# Patient Record
Sex: Female | Born: 1970 | Race: White | Hispanic: No | Marital: Married | Smoking: Never smoker
Health system: Southern US, Community
[De-identification: ages and names within clinical notes are randomized; demographics above are authoritative.]

## PROBLEM LIST (undated history)

## (undated) DIAGNOSIS — R12 Heartburn: Secondary | ICD-10-CM

## (undated) DIAGNOSIS — R519 Headache, unspecified: Secondary | ICD-10-CM

## (undated) DIAGNOSIS — M7989 Other specified soft tissue disorders: Secondary | ICD-10-CM

## (undated) DIAGNOSIS — R5383 Other fatigue: Secondary | ICD-10-CM

## (undated) DIAGNOSIS — M791 Myalgia, unspecified site: Secondary | ICD-10-CM

## (undated) DIAGNOSIS — M2550 Pain in unspecified joint: Secondary | ICD-10-CM

## (undated) DIAGNOSIS — E663 Overweight: Secondary | ICD-10-CM

## (undated) DIAGNOSIS — R0602 Shortness of breath: Secondary | ICD-10-CM

## (undated) HISTORY — DX: Overweight: E66.3

## (undated) HISTORY — DX: Shortness of breath: R06.02

## (undated) HISTORY — DX: Pain in unspecified joint: M25.50

## (undated) HISTORY — DX: Other fatigue: R53.83

## (undated) HISTORY — DX: Myalgia, unspecified site: M79.10

## (undated) HISTORY — DX: Headache, unspecified: R51.9

## (undated) HISTORY — DX: Heartburn: R12

## (undated) HISTORY — DX: Other specified soft tissue disorders: M79.89

---

## 1997-08-21 ENCOUNTER — Ambulatory Visit (HOSPITAL_BASED_OUTPATIENT_CLINIC_OR_DEPARTMENT_OTHER): Admission: RE | Admit: 1997-08-21 | Discharge: 1997-08-21 | Payer: Self-pay | Admitting: Orthopaedic Surgery

## 1998-03-20 ENCOUNTER — Other Ambulatory Visit: Admission: RE | Admit: 1998-03-20 | Discharge: 1998-03-20 | Payer: Self-pay | Admitting: *Deleted

## 1998-08-19 HISTORY — PX: ANTERIOR CRUCIATE LIGAMENT REPAIR: SHX115

## 2000-01-15 ENCOUNTER — Other Ambulatory Visit: Admission: RE | Admit: 2000-01-15 | Discharge: 2000-01-15 | Payer: Self-pay | Admitting: *Deleted

## 2000-06-08 ENCOUNTER — Encounter: Payer: Self-pay | Admitting: Internal Medicine

## 2000-06-08 ENCOUNTER — Encounter: Admission: RE | Admit: 2000-06-08 | Discharge: 2000-06-08 | Payer: Self-pay | Admitting: Internal Medicine

## 2001-01-11 ENCOUNTER — Other Ambulatory Visit: Admission: RE | Admit: 2001-01-11 | Discharge: 2001-01-11 | Payer: Self-pay | Admitting: Obstetrics and Gynecology

## 2001-08-17 ENCOUNTER — Encounter (INDEPENDENT_AMBULATORY_CARE_PROVIDER_SITE_OTHER): Payer: Self-pay | Admitting: *Deleted

## 2001-08-17 ENCOUNTER — Inpatient Hospital Stay (HOSPITAL_COMMUNITY): Admission: AD | Admit: 2001-08-17 | Discharge: 2001-08-21 | Payer: Self-pay | Admitting: Obstetrics and Gynecology

## 2001-10-04 ENCOUNTER — Other Ambulatory Visit: Admission: RE | Admit: 2001-10-04 | Discharge: 2001-10-04 | Payer: Self-pay | Admitting: Obstetrics and Gynecology

## 2003-09-19 ENCOUNTER — Encounter: Admission: RE | Admit: 2003-09-19 | Discharge: 2003-09-19 | Payer: Self-pay | Admitting: Specialist

## 2006-01-29 ENCOUNTER — Encounter: Admission: RE | Admit: 2006-01-29 | Discharge: 2006-01-29 | Payer: Self-pay | Admitting: Obstetrics and Gynecology

## 2006-04-01 ENCOUNTER — Inpatient Hospital Stay (HOSPITAL_COMMUNITY): Admission: AD | Admit: 2006-04-01 | Discharge: 2006-04-04 | Payer: Self-pay | Admitting: Obstetrics and Gynecology

## 2006-06-10 ENCOUNTER — Ambulatory Visit: Payer: Self-pay | Admitting: Family Medicine

## 2007-07-20 LAB — HM PAP SMEAR

## 2007-07-20 LAB — RESULTS CONSOLE HPV: CHL HPV: NEGATIVE

## 2008-07-08 ENCOUNTER — Emergency Department (HOSPITAL_COMMUNITY): Admission: EM | Admit: 2008-07-08 | Discharge: 2008-07-08 | Payer: Self-pay | Admitting: Family Medicine

## 2008-07-09 ENCOUNTER — Ambulatory Visit: Payer: Self-pay | Admitting: Family Medicine

## 2008-10-01 ENCOUNTER — Ambulatory Visit: Payer: Self-pay | Admitting: Family Medicine

## 2009-02-19 ENCOUNTER — Ambulatory Visit: Payer: Self-pay | Admitting: Family Medicine

## 2009-02-21 ENCOUNTER — Encounter: Admission: RE | Admit: 2009-02-21 | Discharge: 2009-02-21 | Payer: Self-pay | Admitting: Family Medicine

## 2009-03-05 ENCOUNTER — Ambulatory Visit (HOSPITAL_BASED_OUTPATIENT_CLINIC_OR_DEPARTMENT_OTHER): Admission: RE | Admit: 2009-03-05 | Discharge: 2009-03-05 | Payer: Self-pay | Admitting: Orthopaedic Surgery

## 2010-07-23 LAB — POCT HEMOGLOBIN-HEMACUE: Hemoglobin: 13.7 g/dL (ref 12.0–15.0)

## 2010-09-05 NOTE — Op Note (Signed)
[facility]  Patient:    VALEN, MASCARO Visit Number: [id number] MRN: [id number]          Service Type: OBS Location: 910A 9146 01 Attending Physician:  Maxie Better Dictated by:   Sheria Lang. Cherly Hensen, M.D. Proc. Date: 08/18/01 Admit Date:  08/17/2001 Discharge Date: 08/21/2001                             Operative Report  PREOPERATIVE DIAGNOSIS:       Nonreassuring fetal status, post dates.  POSTOPERATIVE DIAGNOSIS:      Nonreassuring fetal status, post dates.  OPERATION:                    Primary cesarean section per hysterotomy.  SURGEON:                      Sheronette A. Cherly Hensen, M.D.  ANESTHESIA:                   Spinal.  INDICATIONS:                  This is a 40 year old gravida 2, para 0-0-1-0, married white female at 40-5/7th weeks gestation who was admitted on August 17, 2001, for induction of labor secondary to post dates.  The patient received Cervidil placement on August 17, 2001.  She was subsequently started on Pitocin on Aug 18, 2001, after the Cervidil had been removed.  The patient received up to 20 mU of Pitocin and subsequently had spontaneous rupture of membranes with thick meconium noted.  At that time, her cervix was fingertip, about 1 cm long, -2 vertex presentation.  She had a run of repetitive late decelerations on external monitor which initially responded to positional changes of the mother.  However, it subsequently returned without response to positional changes, etc., and due to the inability to access the fetus with the internal scalp electrode, the decision was made to proceed with primary cesarean section.  Risks and benefits of the procedure were explained to the patient and her husband.  Consent was signed and the patient was transferred to the operating room.  DESCRIPTION OF PROCEDURE:     Under adequate spinal anesthesia, the patient was placed in the supine position with a left lateral  tilt.  She was sterilely prepped and draped in the usual fashion.  An indwelling Foley catheter had been placed after the spinal anesthesia.  A marking pen was utilized to outline the planned Pfannenstiel skin incision.  Quarter percent Marcaine was injected in the longus line.  The scalpel was then used to make a Pfannenstiel skin incision and carried down to the rectus fascia using Bovie cautery.  The rectus fascia was incised in the extended bilaterally.  The rectus fascia was then bluntly and with cautery dissected off the rectus muscle in a superior and inferior fashion.  The rectus muscle was split in the midline.  The parietal peritoneum was entered bluntly and extended superiorly and inferiorly.  The vesicouterine peritoneum was then opened and the bladder dissected off the lower uterine segment in a blunt fashion.  The bladder retractor was then used to displace the bladder inferiorly.  A curvilinear low transverse uterine incision was then made and extended bilaterally using bandaged scissors.  The baby was noted to be in the left occiput transverse to left occiput posterior position.  Thick meconium was noted.  The baby was delivered from its position, DeLeed and bulb suctioned on the abdomen.  The cord was clamped and cut.  The baby was transferred to the waiting pediatrician who assigned Apgars of 9 and 9 at one and five minutes.  Cord pH was obtained which was subsequently noted to be 7.28.  The placenta was anterior spontaneous.  The uterine cavity was then cleaned of debris.  The uterine incision was examined.  No extension noted.  The uterine incision was then closed in two layers, the first layer was a 0 Monocryl running locked stitch.  The second layer was imbricating using 0 Monocryl suture.  The vesicouterine peritoneum showed small bleeders which was cauterized.  The pericolic gutters were then cleaned of debris, irrigated, and suctioned. Normal tubes and ovaries were  noted bilaterally.  The vesicouterine peritoneum and the parietal peritoneum were then closed.  The under surface of the rectus fascia was inspected and small bleeders cauterized.  The rectus fascia was closed with 0 Vicryl x2.  The subcutaneous area was irrigated and small bleeders cauterized.  The skin was approximated with Ethicon staples.  The specimen was placenta sent to pathology.  Estimated blood loss was 800 cc.  Intraoperative fluid was 1600 cc of crystalloid.  Urine output was 575 cc of clear yellow urine.  Sponge and instrument counts x2 was correct.  Complications none.  The patient tolerated the procedure well and was transferred to the recovery room in stable condition. Dictated by:   Sheria Lang. Cherly Hensen, M.D. Attending Physician:  Maxie Better DD:  08/18/01 TD:  08/22/01 Job: [id number]

## 2010-09-05 NOTE — H&P (Signed)
[facility]  Patient:    Amber Parrish, HEHR Visit Number: [id number] MRN: [id number]          Service Type: OBS Location: 910A 9146 01 Attending Physician:  Maxie Better Dictated by:   Sheria Lang. Cherly Hensen, M.D. Admit Date:  08/17/2001                           History and Physical  DATE OF BIRTH:                [birth year].  CHIEF COMPLAINT:              Induction of labor secondary to postdates.  HISTORY OF PRESENT ILLNESS:   This is a 40 year old gravida 2, para 0-0-1-0 married white female with a last menstrual period of November 05, 2000 and an [facility] of August 12, 2001 who is currently at 40-5/[redacted] weeks gestation admitted for cervical ripening and induction of labor secondary to postdates.  The patient has had no contractions.  She is group B strep culture negative.  No vaginal bleeding.  Good fetal movements.  PRENATAL COURSE:              Notable for unexplained elevated AFP3 testing with normal follow-up ultrasound on February 7 at 29 weeks.  PRENATAL LABORATORY DATA:     Blood type A positive.  Antibody screen negative.  RPR nonreactive.  Rubella immune.  Hepatitis B surface antigen negative.  HIV test negative. GC and Chlamydia cultures negative.  Pap was normal.  AFP3 test elevated unexplained.  Ultrasound done at [facility] was unremarkable.  One-hour GCT was normal.  Group B strep culture was negative. Ultrasound on July 25, 2001 showed estimated fetal weight of 3150 g, which was in the 51st percentile.  Amniotic fluid index was at the upper limit of normal at 80th percentile at that time.  ALLERGIES:                    No known drug allergies.  MEDICATIONS:                  1. Prenatal vitamins.                               2. Zantac over-the-counter.  PAST MEDICAL HISTORY:         Migraines.  PAST SURGICAL HISTORY:        1. ACL replacement surgery in December 1995.                               2. Four knee surgeries  in 1995 and 1996.                               3. D&C in May 1996.  PAST OBSTETRIC HISTORY:       TAB in May 1996.  No complications.  FAMILY HISTORY:               Unremarkable.  SOCIAL HISTORY:               Married.  Nonsmoker.  Works as a Chief Operating Officer. Husband is a Investment banker, operational.  REVIEW OF SYSTEMS:            Negative except as in the  history of present illness.  PHYSICAL EXAMINATION:  GENERAL:                      Well-developed, well-nourished gravid white female in no acute distress.  VITAL SIGNS:                  Blood pressure 120/76.  Weight 230 pounds. Fetal heart rate 13-140.  SKIN:                         No lesions.  HEENT:                        Anicteric sclerae.  Pink conjunctivae. Oropharynx negative.  HEART:                        Regular rate and rhythm without murmur.  LUNGS:                        Clear to auscultation.  BREASTS:                      Soft nontender.  No palpable mass.  ABDOMEN:                      Gravid.  Fundal height 40 cm.  PELVIC:                       The cervix is closed, 2 cm and long.  Vertex high.  EXTREMITIES:                  No edema.  IMPRESSION:                   1. Postdates.                               2. Group B strep culture negative.  PLAN:                         Admission.  Routine labs.  Cervical ripening. Pitocin induction on Aug 18, 2001. Dictated by:   Sheria Lang. Cherly Hensen, M.D. Attending Physician:  Maxie Better DD:  08/17/01 TD:  08/17/01 Job: [id number]

## 2010-09-05 NOTE — Discharge Summary (Signed)
Amber Parrish, Amber Parrish             ACCOUNT NO.:  [id number]   MEDICAL RECORD NO.:  [id number]          PATIENT TYPE:  INP   LOCATION:  9114                          FACILITY:  [facility]   PHYSICIAN:  Maxie Better, M.D.DATE OF BIRTH:  10-13-1970   DATE OF ADMISSION:  04/01/2006  DATE OF DISCHARGE:  04/04/2006                               DISCHARGE SUMMARY   ADMISSION DIAGNOSES:  1. Term gestation.  2. Previous cesarean section.   DISCHARGE DIAGNOSES:  1. Term gestation, delivered.  2. Previous cesarean section.  3. Postoperative anemia.   PROCEDURES:  1. Repeat cesarean section.  2. Lysis of adhesions.   HISTORY OF PRESENT ILLNESS:  A 40 year old, gravida 2, para 1 female  with a previous cesarean section now at term requesting a repeat  cesarean section.   HOSPITAL COURSE:  The patient was admitted to [facility].  She  underwent a repeat cesarean section with lysis of adhesions.  The  procedure resulted in the delivery of a 7 pound, 15 ounce live female with  a cord around the neck x1, Apgar's of 9 and 9.  The patient had an  uncomplicated postoperative course.  Her CBC on postop day #1 showed a  hemoglobin of 9.6, hematocrit of 27.7, platelet count of 179,000, white  count of 5.8.  By postop day #3, the patient was tolerating a regular  diet, her incision had no evidence of infection, and she was deemed well  to be discharged home.   DISPOSITION:  Home.   CONDITION ON DISCHARGE:  Stable.   DISCHARGE MEDICATIONS:  1. Niferex one p.o. daily.  2. Colace one to two tablets daily.  3. Motrin 600 mg every 8 hours p.r.n. pain.  4. Percocet one to two tablets every 4 hours p.r.n. pain.   DISCHARGE INSTRUCTIONS:  Per the postpartum booklet given.   FOLLOWUP:  Appointment at [facility] in six weeks.      Maxie Better, M.D.  Electronically Signed     [place]/MEDQ  D:  05/02/2006  T:  05/02/2006  Job:  [id number]

## 2010-09-05 NOTE — Op Note (Signed)
Amber Parrish, Amber Parrish             ACCOUNT NO.:  [id number]   MEDICAL RECORD NO.:  [id number]          PATIENT TYPE:  INP   LOCATION:  9199                          FACILITY:  [facility]   PHYSICIAN:  Maxie Better, M.D.DATE OF BIRTH:  1970-12-24   DATE OF PROCEDURE:  04/01/2006  DATE OF DISCHARGE:                               OPERATIVE REPORT   PREOPERATIVE DIAGNOSIS:  Previous cesarean section, term gestation.   POSTOPERATIVE DIAGNOSIS:  Previous cesarean section, term gestation,  pelvic adhesions.   OPERATION PERFORMED:  Repeat cesarean section, lysis of adhesions.   SURGEON:  Maxie Better, M.D.   ASSISTANT:  Marlinda Mike, C.N.M.   ANESTHESIA:  Spinal.   INDICATIONS FOR PROCEDURE:  A 40 year old gravida 2, para 1 female at  term with previous cesarean section who is now being admitted for repeat  cesarean section.  Her prenatal course has been notable for size greater  than dates with a suspect large for gestational age baby.  The patient  declined attempted vaginal, delivery.  Risks and benefits of the  procedure have been explained.  Consent was signed.  The patient was  transferred to the operating room.   DESCRIPTION OF PROCEDURE:  Under adequate spinal anesthesia, the patient  was placed in supine position with a left lateral tilt.  She was  sterilely prepped and draped in the usual fashion.  An indwelling Foley  catheter was sterilely placed.  0.25% Marcaine was injected along the  previous Pfannenstiel skin incision.  Pfannenstiel skin incision was  made, carried down to the rectus fascia.  Rectus fascia was opened  transversely.  The rectus fascia was then bluntly and sharply dissected  off the rectus muscle in superior and inferior fashion.  The rectus  muscle was split in the midline.  The parietal peritoneum was entered  bluntly and extended.  On entering the pelvis it was then noted that the  lower uterine segment was thin, but there was bladder  adhesions, part of  which was extended up on the right aspect of the uterus, not able to be  reached through the incision.  The portion of the bladder reflection  that could be seen was opened transversely.  The bladder was then  bluntly dissected off the lower uterine segment and displaced  inferiorly.  A curvilinear low transverse uterine incision was then made  and extended carefully with bandage scissors.  Artificial rupture of  membranes was performed.  Copious clear fluid was noted.  On attempt at  the delivery of the vertex, there was very deep flexed head and decision  was then made to proceed with the vacuum for assistance with the  delivery.  Vacuum was applied.  After several attempts, the vertex was  delivered of a direct OP position.  Cord around the neck was reduced.  Baby was bulb suctioned on the abdomen.  The rest of the baby was  delivered.  Cord was then clamped and cut.  The baby was transferred to  the waiting pediatrician who assigned Apgars of 9 and 9 at one and five  minutes.  The placenta was spontaneous intact,  not sent to pathology.  Uterine cavity was cleaned of debris.  Uterus was then exteriorized in  order to ascertain the rest of the bladder adhesion.  It was then noted  that the right aspect of the bladder reflection was up two thirds of the  way on the uterus on the right and this was taken down with cautery.  The uterine incision had no extension.  Uterus was cleaned of debris.  The uterine incision was closed in two layers, first layer of 0 Monocryl  running locked stitch, second layer imbricating using the 0 Monocryl  sutures.  Small bleeders were cauterized.  The uterus was then returned  to the abdomen.  Normal tubes and ovaries were noted.  The abdomen was  copiously irrigated, suctioned of debris.  Reinspection of the incision  sites with good hemostasis.  Small bleeding on the inferior aspect of  the uterus was cauterized.  The parietal peritoneum  was not closed, so  as not to bring the bladder up further as it was prior to the scar  tissue being removed.  The rectus fascia was then closed with 0 Vicryl  x2.  The subcutaneous area was irrigated.  2-0 plain interrupted sutures  were then placed.  Skin was approximated with Ethicon staples.   SPECIMENS:  Placenta, not sent to Pathology.   ESTIMATED BLOOD LOSS:  700 mL.   INTRAOPERATIVE FLUIDS:  3 L.   URINE OUTPUT:  325 mL of clear yellow urine.   SPONGE AND INSTRUMENT COUNTS:  x2 correct.   COMPLICATIONS:  None.   The patient tolerated the procedure well, was transferred to recovery  room in stable condition.      Maxie Better, M.D.  Electronically Signed     [place]/MEDQ  D:  04/01/2006  T:  04/01/2006  Job:  [id number]

## 2010-09-05 NOTE — Discharge Summary (Signed)
[facility]  Patient:    Amber Parrish, Amber Parrish Visit Number: [id number] MRN: [id number]          Service Type: OBS Location: 910A 9146 01 Attending Physician:  Maxie Better Dictated by:   Sheria Lang. Cherly Hensen, M.D. Admit Date:  08/17/2001 Discharge Date: 08/21/2001                             Discharge Summary  ADMISSION DIAGNOSIS:          Post dates.  DISCHARGE DIAGNOSES:          1. Term gestation, delivered.                               2. Nonreassuring fetal status.                               3. Status post primary cesarean section.                               4. Postoperative anemia.  PROCEDURE:                    Primary cesarean section, low transverse uterine incision, Aug 18, 2001.  HISTORY OF PRESENT ILLNESS:   A 40 year old gravida 2, para 0 female at 40-5/[redacted] weeks gestation admitted on August 17, 2001, for cervical ripening and induction of labor secondary to postdates.  The patient had an unremarkable prenatal course.  She had unexplained elevated AFP-3 testing with normal anatomic survey by ultrasound of the fetus.  Blood type is A-positive.  Her Rubella was immune.  Hepatitis B surface antigen is negative.  HOSPITAL COURSE:              The patient was admitted.  She had a reactive nonstress test.  Cervidil was placed for cervical ripening.  On Aug 18, 2001, the patient had spontaneous rupture of membranes, thick meconium.  Her cervix was closed, vertex, -2.  Pitocin had been started and her tracing showed a baseline fetal heart rate as 155-160 with repetitive late deceleration on external monitor with inability to assess the fetus due to the cervix being closed.  Given the findings, the decision was made to proceed with a primary cesarean section.  Risks and benefits were reviewed with the patient and her husband.  She was transferred to the operating room where she underwent a low transverse uterine incision with  resultant delivery of a live female, weight of 7 pounds, 8 ounces.  Thick meconium was noted.  Apgars of 9 and 9.  Cord pH was 7.28.  Normal tubes and ovaries were identified.  The placenta was intact, spontaneous.  The final pathology on the placenta showed fetal membranes consistent with meconium staining and three-vessel cord.  Her postoperative course was unremarkable.  The CBC on postoperative day #1, showed a hemoglobin of 8.9, hematocrit 25.7, white count of 7.9, platelet count of 200,000.  The patient was asymptomatic.  By postoperative day #2, the patient was tolerating a regular diet, ambulating.  She had remained afebrile throughout the course.  Her incision showed no erythema, induration, or exudate.  She was deemed well to be discharged home.  DISPOSITION:  Home.  CONDITION ON DISCHARGE:       Stable.  DISCHARGE MEDICATIONS:        1. Tylox 1-2 tablets every 3-4 hours p.r.n.                                  pain.                               2. Prenatal vitamins 1 p.o. q.d.                               3. Motrin 800 mg 1 p.o. q.6-8h. p.r.n. pain.                               4. Ferrous sulfate 325 mg p.o. q.d.  Staple removal was done.  DISCHARGE INSTRUCTIONS:       Call for temperature greater than or equal to 100.4, nothing per vagina for 4-6 weeks, no heavy lifting or driving for two weeks.  Call if soaking a regular pad every hour or more frequently, severe abdominal pain, nausea or vomiting, increased incisional pain, redness, or drainage from the incision site.  FOLLOWUP:                     Appointment at [facility] in 4-6 weeks. Dictated by:   Sheria Lang. Cherly Hensen, M.D. Attending Physician:  Maxie Better DD:  09/07/01 TD:  09/09/01 Job: [id number]

## 2010-11-03 ENCOUNTER — Encounter: Payer: Self-pay | Admitting: Family Medicine

## 2010-11-03 ENCOUNTER — Ambulatory Visit (INDEPENDENT_AMBULATORY_CARE_PROVIDER_SITE_OTHER): Payer: Commercial Managed Care - PPO | Admitting: Family Medicine

## 2010-11-03 VITALS — BP 112/70 | HR 80 | Wt 225.0 lb

## 2010-11-03 DIAGNOSIS — R609 Edema, unspecified: Secondary | ICD-10-CM

## 2010-11-03 LAB — COMPREHENSIVE METABOLIC PANEL
ALT: 48 U/L — ABNORMAL HIGH (ref 0–35)
AST: 44 U/L — ABNORMAL HIGH (ref 0–37)
Albumin: 4.6 g/dL (ref 3.5–5.2)
Alkaline Phosphatase: 69 U/L (ref 39–117)
Calcium: 9.4 mg/dL (ref 8.4–10.5)
Chloride: 106 mEq/L (ref 96–112)
Potassium: 3.9 mEq/L (ref 3.5–5.3)

## 2010-11-03 LAB — POCT URINALYSIS DIPSTICK
Bilirubin, UA: NEGATIVE
Blood, UA: NEGATIVE
Glucose, UA: NEGATIVE
Nitrite, UA: NEGATIVE
Spec Grav, UA: 1.025
Urobilinogen, UA: NEGATIVE
pH, UA: 5

## 2010-11-03 LAB — CBC WITH DIFFERENTIAL/PLATELET
Basophils Absolute: 0 10*3/uL (ref 0.0–0.1)
Basophils Relative: 1 % (ref 0–1)
Lymphocytes Relative: 23 % (ref 12–46)
MCHC: 33 g/dL (ref 30.0–36.0)
Neutro Abs: 2.7 10*3/uL (ref 1.7–7.7)
Neutrophils Relative %: 68 % (ref 43–77)
Platelets: 248 10*3/uL (ref 150–400)
RDW: 13.1 % (ref 11.5–15.5)
WBC: 3.9 10*3/uL — ABNORMAL LOW (ref 4.0–10.5)

## 2010-11-03 NOTE — Progress Notes (Signed)
  Subjective:    Patient ID: Amber Parrish, female    DOB: [birth year], 40 y.o.   MRN: [id number]  HPI She has a three-day history of bilateral hand swelling and inability to make a fist. Also her rings are not removable. Her feet do appear to be slightly swollen.. urinary habits are normal. She is on no medications. No previous history of swelling or kidney issues. She has an IUD and has not had a cycle in 5 years. She is on no illegal drugs or other supplements.   Review of Systems Negative except as above    Objective:   Physical Exam Alert and in no distress. Exam of her hands does show diffuse nonpitting edema. Face appears normal. Lower extremities again show slight edema but no pitting. Reflexes are normal. Urinalysis negative.       Assessment & Plan:  Edema, etiology unclear Routine blood screening.

## 2010-11-04 ENCOUNTER — Telehealth: Payer: Self-pay

## 2010-11-04 ENCOUNTER — Other Ambulatory Visit: Payer: Self-pay

## 2010-11-04 MED ORDER — FUROSEMIDE 20 MG PO TABS: 20.0000 mg | ORAL_TABLET | Freq: Every day | ORAL | Status: DC

## 2010-11-04 NOTE — Telephone Encounter (Signed)
Called pt about results and called med in

## 2010-11-05 ENCOUNTER — Telehealth: Payer: Self-pay

## 2010-11-05 NOTE — Telephone Encounter (Signed)
Dr.Lalonde wanted me to call pt to see how she is doing she said right arm is stiff but left arm from arm pitt to pinky finger it is asleep left wrist hurts  Told pt one of [place] will get back with her she said right arm does feel better

## 2010-11-05 NOTE — Telephone Encounter (Signed)
Have her come in to see me next week 

## 2011-01-12 ENCOUNTER — Ambulatory Visit (INDEPENDENT_AMBULATORY_CARE_PROVIDER_SITE_OTHER): Payer: Commercial Managed Care - PPO | Admitting: Family Medicine

## 2011-01-12 ENCOUNTER — Encounter: Payer: Self-pay | Admitting: Family Medicine

## 2011-01-12 VITALS — BP 140/90 | HR 80 | Wt 229.0 lb

## 2011-01-12 DIAGNOSIS — Z23 Encounter for immunization: Secondary | ICD-10-CM

## 2011-01-12 DIAGNOSIS — R600 Localized edema: Secondary | ICD-10-CM

## 2011-01-12 DIAGNOSIS — Z634 Disappearance and death of family member: Secondary | ICD-10-CM

## 2011-01-12 DIAGNOSIS — R609 Edema, unspecified: Secondary | ICD-10-CM

## 2011-01-12 MED ORDER — FUROSEMIDE 20 MG PO TABS: 20.0000 mg | ORAL_TABLET | Freq: Every day | ORAL | Status: DC

## 2011-01-12 NOTE — Progress Notes (Signed)
  Subjective:    Patient ID: Amber Parrish, female    DOB: [birth year], 40 y.o.   MRN: [id number]  HPI  she continues to have difficulty with swelling of her hands. She was seen in mid July and given Lasix. Blood work at that time was negative. Since then she has had little difficulty until last several days. She called me yesterday he complains of increased swelling and difficulty getting her ring off. I did give her proper instructions on how to remove the ring without cutting it. She continues on her Mirena which interferes with her normal menstrual cycle. She cannot relate the swelling to any monthly cycling. She is also dealing recently with the death of her biological father whom she was estranged from and her brother-in-law both of which had alcohol problems. She seems to be handling this well however her husband is having difficulty dealing with the death of his brother   Review of Systems     Objective:   Physical Exam Bilateral hand edema is noted. Exam of her legs does show some swelling but no pitting. Residual damage is noted underneath the ring on her ring finger.       Assessment & Plan:  Peripheral edema. Bereavement. Recommend she use the Lasix for the next couple days and see if this is cyclic in regard to monthly hormonal changes. We also talked at length concerning getting bereavement especially for her husband.

## 2011-01-12 NOTE — Patient Instructions (Signed)
Take a fluid pill for the next several days to help with the swelling. Keep track of your symptoms in regard to time of the month especially mid to late October

## 2011-01-26 ENCOUNTER — Ambulatory Visit (INDEPENDENT_AMBULATORY_CARE_PROVIDER_SITE_OTHER): Payer: Commercial Managed Care - PPO | Admitting: Family Medicine

## 2011-01-26 VITALS — BP 112/74 | HR 79 | Wt 228.0 lb

## 2011-01-26 DIAGNOSIS — M79609 Pain in unspecified limb: Secondary | ICD-10-CM

## 2011-01-26 DIAGNOSIS — E669 Obesity, unspecified: Secondary | ICD-10-CM

## 2011-01-26 DIAGNOSIS — M79602 Pain in left arm: Secondary | ICD-10-CM

## 2011-01-26 DIAGNOSIS — R609 Edema, unspecified: Secondary | ICD-10-CM

## 2011-01-26 LAB — COMPREHENSIVE METABOLIC PANEL
Alkaline Phosphatase: 60 U/L (ref 39–117)
BUN: 14 mg/dL (ref 6–23)
CO2: 26 mEq/L (ref 19–32)
Creat: 0.61 mg/dL (ref 0.50–1.10)
Glucose, Bld: 82 mg/dL (ref 70–99)
Total Bilirubin: 0.6 mg/dL (ref 0.3–1.2)
Total Protein: 7.5 g/dL (ref 6.0–8.3)

## 2011-01-26 LAB — TSH: TSH: 1.397 u[IU]/mL (ref 0.350–4.500)

## 2011-01-26 NOTE — Patient Instructions (Signed)
Let's see what the nutritionist can do to help. Also straightening your elbow out when he notes a tingling sensation in your fingers.

## 2011-01-26 NOTE — Progress Notes (Signed)
  Subjective:    Patient ID: Amber Parrish, female    DOB: 13-Aug-1970, 40 y.o.   MRN: [id number]  HPI She continues to have difficulty with peripheral edema noting it mainly in her arms. She also said some difficulty with left shoulder pain but this occurs mainly at night. She can do her activities of daily living without trouble however when she lays down at night she has also feel a tingling sensation in the third fourth and fifth fingers however she straightens her a while, the symptoms do diminished however her pain continues in her shoulder.   Review of Systems     Objective:   Physical Exam Alert and in no distress. Full motion of the shoulder. No laxity. No pain on motion. Exam of her hands shows diffuse minimal edema but no pitting she is however unable to get her wedding ring on..       Assessment & Plan:   1. Peripheral edema  CBC with Differential, Comprehensive metabolic panel, TSH, Amb ref  Medical Nutrition Therapy (MNT)  2. Obesity (BMI 30-39.9)  CBC with Differential, Comprehensive metabolic panel, TSH, Amb ref  Medical Nutrition Therapy (MNT)  3. Left arm pain     straighten the elbow when she has the elbow discomfort.

## 2011-01-27 LAB — CBC WITH DIFFERENTIAL/PLATELET
Basophils Relative: 1 % (ref 0–1)
Eosinophils Absolute: 0.3 10*3/uL (ref 0.0–0.7)
Eosinophils Relative: 5 % (ref 0–5)
Hemoglobin: 12.7 g/dL (ref 12.0–15.0)
Lymphs Abs: 1.6 10*3/uL (ref 0.7–4.0)
MCH: 30.5 pg (ref 26.0–34.0)
MCHC: 33.1 g/dL (ref 30.0–36.0)
MCV: 92.1 fL (ref 78.0–100.0)
Monocytes Absolute: 0.5 10*3/uL (ref 0.1–1.0)
Monocytes Relative: 8 % (ref 3–12)
Neutrophils Relative %: 59 % (ref 43–77)
RBC: 4.17 MIL/uL (ref 3.87–5.11)

## 2011-02-03 ENCOUNTER — Encounter: Payer: Self-pay | Admitting: *Deleted

## 2011-02-03 ENCOUNTER — Encounter: Payer: Commercial Managed Care - PPO | Attending: Family Medicine | Admitting: *Deleted

## 2011-02-03 DIAGNOSIS — Z713 Dietary counseling and surveillance: Secondary | ICD-10-CM | POA: Insufficient documentation

## 2011-02-03 DIAGNOSIS — E663 Overweight: Secondary | ICD-10-CM | POA: Insufficient documentation

## 2011-02-03 NOTE — Patient Instructions (Signed)
2 Carbs per meal and 1 Carb per snack during the week 2-3 Carbs per meal on weekends Assess actual Carb content of cranberry juice and work into meal plan accordingly Continue with exercise, especially biking due to knee injuries in past Consider yoga as sun goes down earlier and weather gets cooler Write down successes and give yourself credit for them Eat food only if on a plate or in a bowl, now out of the package Consider writing food onto Menu Planner to increase awareness and make decision as to eat it or not.

## 2011-02-03 NOTE — Progress Notes (Signed)
  Medical Nutrition Therapy:  Appt start time: 1500 end time:  1600.   Assessment:  Primary concerns today: Patient is ready and motivated to work on weight loss. She is exercising 1-3 times per week and is making good food choices the first half of the day. As the day goes on, she snacks of foods even if she isn't hungry and drinks large glasses of cranberry juice throughout the day.   MEDICATIONS: Lasix for recent swelling of her hands, and Mirena IUD   DIETARY INTAKE:  Usual eating pattern includes 3 meals and 2 snacks per day.  Everyday foods include good variety of all food groups.  Avoided foods include sodas, desserts.    24-hr recall:  B ( AM): 1/2 thin bagel with 1 tsp butter, 2 eggs, 8-12 oz 2% milk  Snk ( AM): large glass of cranberry juice, occasionally a high protein bar  L ( PM): sandwich or left overs from home, hot food from work cafeteria Snk ( PM): occasionally snacks while preparing dinner D ( PM): lean meat, vegetable, occasional starch Snk ( PM): infrequent Beverages: water, cranberry juice  Usual physical activity: walks or rides bike on weekends and in evenings during the week if not working late.  Estimated energy needs: 1400 calories 158 g carbohydrates 105 g protein 39 g fat  Progress Towards Goal(s):  In progress.   Nutritional Diagnosis:  NI-1.5 Excessive energy intake As related to efforts to loose weight.  As evidenced by lack of weight loss even with increased activity level.    Intervention:  Nutrition counseling recommending a meal plan of 2 Carb Choices per meal, 1 Choice per snack during the week and 2-3 choices per meal on the weekends. Recommend moderate intake of protein choices and fat servings.  Handouts given during visit include:  Carb Counting and Meal Planning Booklet by [employer]  Carb Counting and Academic librarian handout  Monitoring/Evaluation:  Dietary intake, exercise, self recognition for success in food  choices, and body weight in 5 week(s).

## 2011-02-27 ENCOUNTER — Ambulatory Visit (INDEPENDENT_AMBULATORY_CARE_PROVIDER_SITE_OTHER): Payer: Commercial Managed Care - PPO | Admitting: Family Medicine

## 2011-02-27 ENCOUNTER — Encounter: Payer: Self-pay | Admitting: Family Medicine

## 2011-02-27 VITALS — BP 112/72 | HR 64 | Wt 223.0 lb

## 2011-02-27 DIAGNOSIS — E669 Obesity, unspecified: Secondary | ICD-10-CM

## 2011-02-27 NOTE — Progress Notes (Signed)
  Subjective:    Patient ID: Amber Parrish, female    DOB: 04/05/1971, 40 y.o.   MRN: [id number]  HPI She is here for a recheck. She did visit the dietitian and really did not gain anything that she did not already know. She does recognize that she does impulse eating as well as needs to work on portion control. Her rings are fitting better now.  Review of Systems     Objective:   Physical Exam Alert and in no distress otherwise not examined; weight is down 5 pounds      Assessment & Plan:  Obesity. I encouraged her to continue with portion control. Discussed impulse eating and encouraged her to make sure she has nutritionally sound foods in the house that are low in calories. Encouraged her to continue with exercise. Return here as needed.

## 2014-01-30 ENCOUNTER — Ambulatory Visit (INDEPENDENT_AMBULATORY_CARE_PROVIDER_SITE_OTHER): Payer: Managed Care, Other (non HMO) | Admitting: Family Medicine

## 2014-01-30 ENCOUNTER — Encounter: Payer: Self-pay | Admitting: Family Medicine

## 2014-01-30 VITALS — BP 120/78 | HR 71 | Wt 210.0 lb

## 2014-01-30 DIAGNOSIS — S93601A Unspecified sprain of right foot, initial encounter: Secondary | ICD-10-CM

## 2014-01-30 NOTE — Progress Notes (Signed)
   Subjective:    Patient ID: Amber Parrish, female    DOB: [birth year], 43 y.o.   MRN: [id number]  HPI She injured her right foot while running on Sunday. She describes an inversion type injury. She was able to continue to run but did not experience pain until later that day. She still having some discomfort with walking and has withheld running. She is trying to get ready for a half marathon.   Review of Systems     Objective:   Physical Exam Right foot exam shows full motion of the ankle. No tenderness over the medial or lateral epicondyle. ATF is negative. She does have some tenderness over the third and fourth metatarsals. X-ray is negative.       Assessment & Plan:  Foot sprain, right, initial encounter  recommend supportive care with shoes with good arch support. She can increase her physical activities the pain. Person is able to walk without pain and then start a walking jaw program slowly increasing her jogging. She will call if further difficulties.

## 2014-01-30 NOTE — Patient Instructions (Signed)
First you must be able to walk without pain and then slowly increase your walking and jogging until you can fully jogwithout pain. Each day that you takeoff requires 2 to get back

## 2014-06-25 ENCOUNTER — Encounter: Payer: Self-pay | Admitting: Family Medicine

## 2014-06-25 ENCOUNTER — Ambulatory Visit (INDEPENDENT_AMBULATORY_CARE_PROVIDER_SITE_OTHER): Payer: Managed Care, Other (non HMO) | Admitting: Family Medicine

## 2014-06-25 VITALS — BP 122/80 | HR 71 | Wt 210.0 lb

## 2014-06-25 DIAGNOSIS — S90822A Blister (nonthermal), left foot, initial encounter: Secondary | ICD-10-CM

## 2014-06-25 DIAGNOSIS — S90821A Blister (nonthermal), right foot, initial encounter: Secondary | ICD-10-CM | POA: Diagnosis not present

## 2014-06-25 NOTE — Patient Instructions (Signed)
Make sure you had good shoes with adequate arch support. I would also patent that area

## 2014-06-25 NOTE — Progress Notes (Signed)
   Subjective:    Patient ID: Amber Parrish, female    DOB: 08/15/1970, 44 y.o.   MRN: [id number]  HPI 2 days ago while running she noted discomfort and swelling over the medial aspect of both first MTPs. When she took her shoes all she noted blistering with blood in the blister. She is here for further evaluation. She has another running event coming up in the near future.  Review of Systems     Objective:   Physical Exam Evaluation of both feet does show blood blister present to the medial aspect of both first MTP joints. No pain on motion of the joints. No other blistering or skin problems noted.       Assessment & Plan:  Blister of foot, left, initial encounter  Blister of foot, right, initial encounter  the blisters were cleaned with alcohol. A small incision was made and the fluid was removed without difficulty. A slight pressure dressing was applied. Discussed care of this including using a doughnut to protect the area also recommend making sure that her shoes fit properly and have good arch supports. Discussed the surface that she walks on and the fact that she needs to change her running shoes regularly. If she gets a stylet she likes and works well, she should by several per.

## 2015-07-23 ENCOUNTER — Encounter: Payer: Self-pay | Admitting: Family Medicine

## 2015-07-23 ENCOUNTER — Ambulatory Visit (INDEPENDENT_AMBULATORY_CARE_PROVIDER_SITE_OTHER): Payer: 59 | Admitting: Family Medicine

## 2015-07-23 VITALS — BP 122/78 | HR 64 | Temp 98.2°F | Wt 222.4 lb

## 2015-07-23 DIAGNOSIS — J209 Acute bronchitis, unspecified: Secondary | ICD-10-CM | POA: Diagnosis not present

## 2015-07-23 MED ORDER — CLARITHROMYCIN 500 MG PO TABS: 500.0000 mg | ORAL_TABLET | Freq: Two times a day (BID) | ORAL | Status: DC

## 2015-07-23 NOTE — Patient Instructions (Signed)
Take all the antibiotic and if not totally back to normal when you finish call me. NyQuil at night and Robitussin-DM during the day

## 2015-07-23 NOTE — Progress Notes (Signed)
Subjective:     Patient ID: Fransico HimJessica L Gil, female   DOB: 30-Jul-1970, 45 y.o.   MRN: [id number]  HPI Ms. Rath presents to clinic with a 3-4 week history of irritated but not sore throat, fatigue and fever that has progressed over the past week to coughing fits and difficulty breathing because of a swollen throat to the point that she has lost her voice and had difficulty with slight shortness of breath She denies chest pain, rhinorrhea, earacheand sinus pain, and says her fever has remitted in the past couple of weeks.  Her symptoms have only minimally improved with nyquil / dayquil.  She does not keep up with routine vaccinations, is a non-smoker, and has no underlying lung conditions.    Review of Systems     Objective:   Physical Exam Alert and in no distress. Tympanic membranes and canals are normal. Pharyngeal area is normal. Neck is supple without adenopathy or thyromegaly. Cardiac exam shows a regular sinus rhythm without murmurs or gallops.Harsh expiratory sounds on auscultation of lungs bilaterally.     Assessment:     Acute bronchitis, unspecified organism - Plan: clarithromycin (BIAXIN) 500 MG tablet      Plan:     Acute bronchitis Treat with a 10 day course of clarithromycin due to penicillin allergy. Follow up if symptoms do not persist after completing the antibiotic regimen.   Also recommended symptomatic management with NyQuil at night and Robitussin-DM during the day.

## 2016-06-08 ENCOUNTER — Ambulatory Visit: Payer: 59 | Admitting: Medical

## 2016-06-10 ENCOUNTER — Encounter: Payer: Self-pay | Admitting: Medical

## 2016-06-10 ENCOUNTER — Ambulatory Visit (INDEPENDENT_AMBULATORY_CARE_PROVIDER_SITE_OTHER): Payer: Managed Care, Other (non HMO) | Admitting: Medical

## 2016-06-10 VITALS — BP 136/90 | HR 78 | Temp 99.0°F | Wt 233.4 lb

## 2016-06-10 DIAGNOSIS — R6889 Other general symptoms and signs: Secondary | ICD-10-CM

## 2016-06-10 DIAGNOSIS — R52 Pain, unspecified: Secondary | ICD-10-CM | POA: Diagnosis not present

## 2016-06-10 DIAGNOSIS — R509 Fever, unspecified: Secondary | ICD-10-CM

## 2016-06-10 NOTE — Progress Notes (Signed)
Subjective; Chief Complaint  Patient presents with  . fever , headache    fever ,headaches,bodyaches   Here for variety of symptoms.  Rarely comes to doctor, not usually sick.   But for past 3 days has had low grade fever, body aches ,chills, fatigue, doesn't feel herself, some dizziness, some sore throat.  Missed some time from work last few days due to symptoms.  Using dayquil OTC.   No flu contacts.  Her sone does have URI symptoms. He is 44yo.  She denies NVD, no SOB, no rash, no ear pain, no cough.  No recent travel, no urinary symptoms. No bleeding or bruising  .  No other aggravating or relieving factors. No other complaint.  No past medical history on file.   No current outpatient prescriptions on file prior to visit.   No current facility-administered medications on file prior to visit.     Objective: BP 136/90   Pulse 78   Temp 99 F (37.2 C)   Wt 233 lb 6.4 oz (105.9 kg)   SpO2 97%   BMI 39.44 kg/m   General appearance: alert, no distress, WD/WN, mildly ill appearing HEENT: normocephalic, sclerae anicteric, conjunctiva pink and moist, TMs bilat with mild erythema, R>L, nares patent, no discharge or erythema, pharynx normal, tonsils unremarkable Oral cavity: MMM, no lesions Neck: supple, no lymphadenopathy, no thyromegaly, no masses Heart: RRR, normal S1, S2, no murmurs Lungs: CTA bilaterally, no wheezes, rhonchi, or rales Pulses: 2+ symmetric No edema    Assessment: Encounter Diagnoses  Name Primary?  . Flu-like symptoms Yes  . Fever, unspecified fever cause   . Body aches     Plan: Exam and symptoms suggest possible recent mild flu like illness.  Advised rest, hydration, Tylenol or Advil for fever, aches, and chills.   symptom should gradually improve the next few days.    Symptoms don't suggest other etiologies.   She voices understanding and agreement of plan.    Shanda BumpsJessica was seen today for fever , headache.  Diagnoses and all orders for this  visit:  Flu-like symptoms  Fever, unspecified fever cause  Body aches

## 2016-06-12 ENCOUNTER — Telehealth: Payer: Self-pay

## 2016-06-12 NOTE — Telephone Encounter (Signed)
Pt states she has not been back to work. She is getting dizzy when standing. Is it ok to extend her work note.  [phone].

## 2016-06-12 NOTE — Telephone Encounter (Signed)
Yes, but push water, maybe some Gatorade, can use OTC medication to help symptoms such as benadryl or other cough/cold medication OTC

## 2016-06-12 NOTE — Telephone Encounter (Signed)
Pt was notified and will call us back with fax number so we can send work number.

## 2016-11-11 ENCOUNTER — Ambulatory Visit
Admission: RE | Admit: 2016-11-11 | Discharge: 2016-11-11 | Disposition: A | Discharge status: Home / Self Care | Payer: 59 | Source: Ambulatory Visit | Attending: Family Medicine | Admitting: Family Medicine

## 2016-11-11 ENCOUNTER — Encounter: Payer: Self-pay | Admitting: Family Medicine

## 2016-11-11 ENCOUNTER — Ambulatory Visit (INDEPENDENT_AMBULATORY_CARE_PROVIDER_SITE_OTHER): Payer: Managed Care, Other (non HMO) | Admitting: Family Medicine

## 2016-11-11 VITALS — BP 120/82 | HR 78 | Wt 242.0 lb

## 2016-11-11 DIAGNOSIS — M25532 Pain in left wrist: Secondary | ICD-10-CM | POA: Diagnosis not present

## 2016-11-11 NOTE — Patient Instructions (Signed)
Take 2 Aleve twice per day 

## 2016-11-11 NOTE — Progress Notes (Signed)
   Subjective:    Patient ID: Amber Parrish, female    DOB: 1971-02-14, 46 y.o.   MRN: [id number]  HPI She complains of a 3 month history of left wrist pain. No history of injury or overuse. No other joints are involved. No fever, chills, skin or hair changes. The pain has interfered with her ADLs in that she cannot put her bra on and has difficulty opening and closing doors.   Review of Systems     Objective:   Physical Exam Left wrist exam does show questionable effusion in the carpal area with some tenderness to palpation on the dorsal surface. Pain on motion of the wrist. Strength is normal. Sensation normal. Exam of the elbows knees and ankles shows no effusion.       Assessment & Plan:  Left wrist pain - Plan: DG Wrist Complete Left The exact etiology of this is unclear. We'll start with an x-ray and also have her take 2 Aleve twice per day. Recommend we try this for several weeks and if no improvement, refer to rheumatology

## 2018-04-06 ENCOUNTER — Encounter: Payer: Self-pay | Admitting: Family Medicine

## 2018-04-06 ENCOUNTER — Ambulatory Visit (INDEPENDENT_AMBULATORY_CARE_PROVIDER_SITE_OTHER): Payer: Managed Care, Other (non HMO) | Admitting: Family Medicine

## 2018-04-06 VITALS — BP 120/80 | HR 75 | Temp 98.0°F | Ht 64.0 in | Wt 249.0 lb

## 2018-04-06 DIAGNOSIS — Z0000 Encounter for general adult medical examination without abnormal findings: Secondary | ICD-10-CM | POA: Diagnosis not present

## 2018-04-06 DIAGNOSIS — Z833 Family history of diabetes mellitus: Secondary | ICD-10-CM | POA: Diagnosis not present

## 2018-04-06 DIAGNOSIS — Z23 Encounter for immunization: Secondary | ICD-10-CM | POA: Diagnosis not present

## 2018-04-06 LAB — POCT URINALYSIS DIP (PROADVANTAGE DEVICE)
BILIRUBIN UA: NEGATIVE mg/dL
Bilirubin, UA: NEGATIVE
Glucose, UA: NEGATIVE mg/dL
LEUKOCYTES UA: NEGATIVE
NITRITE UA: NEGATIVE
PROTEIN UA: NEGATIVE mg/dL
RBC UA: NEGATIVE
Specific Gravity, Urine: 1.01
Urobilinogen, Ur: 3.5
pH, UA: 6 (ref 5.0–8.0)

## 2018-04-06 NOTE — Progress Notes (Signed)
Established Patient Office Visit  Subjective:  Patient ID: Amber Parrish, female    DOB: 10/03/1970  Age: 47 y.o. MRN: [id number]  CC:  Chief Complaint  Patient presents with  . other    fasting CPE    HPI Amber Parrish presents for a complete exam.  She does see her gynecologist regularly.  Family history is significant for diabetes.  She recently finished her masters degree and has started into a cardio program 3 times per week and is making some dietary changes.  She plans to continue this indefinitely.  She does not smoke and drinks socially.  No street drugs.  Her marriage is going well.  She has 2 sons both doing well.    Past Surgical History:  Procedure Laterality Date  . ANTERIOR CRUCIATE LIGAMENT REPAIR  08/1998  . CESAREAN SECTION  08/18/2001    Family History  Problem Relation Age of Onset  . Diabetes Mother   . Hypertension Mother   . Diabetes Father   . Arthritis Maternal Grandmother   . Cancer Maternal Grandmother   . Diabetes Maternal Grandmother   . Hypertension Maternal Grandmother     Social History   Socioeconomic History  . Marital status: Married    Spouse name: Not on file  . Number of children: Not on file  . Years of education: Not on file  . Highest education level: Not on file  Occupational History  . Not on file  Social Needs  . Financial resource strain: Not on file  . Food insecurity:    Worry: Not on file    Inability: Not on file  . Transportation needs:    Medical: Not on file    Non-medical: Not on file  Tobacco Use  . Smoking status: Never Smoker  . Smokeless tobacco: Never Used  Substance and Sexual Activity  . Alcohol use: Yes    Alcohol/week: 7.0 standard drinks    Types: 7 Glasses of wine per week  . Drug use: Never  . Sexual activity: Yes  Lifestyle  . Physical activity:    Days per week: Not on file    Minutes per session: Not on file  . Stress: Not on file  Relationships  . Social connections:   Talks on phone: Not on file    Gets together: Not on file    Attends religious service: Not on file    Active member of club or organization: Not on file    Attends meetings of clubs or organizations: Not on file    Relationship status: Not on file  . Intimate partner violence:    Fear of current or ex partner: Not on file    Emotionally abused: Not on file    Physically abused: Not on file    Forced sexual activity: Not on file  Other Topics Concern  . Not on file  Social History Narrative  . Not on file    Outpatient Medications Prior to Visit  Medication Sig Dispense Refill  . aspirin 500 MG tablet Take 500 mg by mouth every 6 (six) hours as needed for pain.    . diphenhydrAMINE HCl (BENADRYL ALLERGY PO) Take by mouth.     No facility-administered medications prior to visit.     Allergies  Allergen Reactions  . Penicillins     ROS Review of Systems    Objective:    Physical Exam  BP 120/80 (BP Location: Left Arm, Patient Position: Sitting)   Pulse  75   Temp 98 F (36.7 C)   Ht 5\' 4"  (1.626 m)   Wt 249 lb (112.9 kg)   LMP  (LMP Unknown)   SpO2 97%   BMI 42.74 kg/m  Wt Readings from Last 3 Encounters:  04/06/18 249 lb (112.9 kg)  11/11/16 242 lb (109.8 kg)  06/10/16 233 lb 6.4 oz (105.9 kg)  BP 120/80 (BP Location: Left Arm, Patient Position: Sitting)   Pulse 75   Temp 98 F (36.7 C)   Ht 5\' 4"  (1.626 m)   Wt 249 lb (112.9 kg)   LMP  (LMP Unknown)   SpO2 97%   BMI 42.74 kg/m   General Appearance:    Alert, cooperative, no distress, appears stated age  Head:    Normocephalic, without obvious abnormality, atraumatic  Eyes:    PERRL, conjunctiva/corneas clear, EOM's intact, fundi    benign  Ears:    Normal TM's and external ear canals  Nose:   Nares normal, mucosa normal, no drainage or sinus   tenderness  Throat:   Lips, mucosa, and tongue normal; teeth and gums normal  Neck:   Supple, no lymphadenopathy;  thyroid:  no    enlargement/tenderness/nodules; no carotid   bruit or JVD     Lungs:     Clear to auscultation bilaterally without wheezes, rales or     ronchi; respirations unlabored      Heart:    Regular rate and rhythm, S1 and S2 normal, no murmur, rub   or gallop  Breast Exam:    Deferred to GYN  Abdomen:     Soft, non-tender, nondistended, normoactive bowel sounds,    no masses, no hepatosplenomegaly  Genitalia:    Deferred to GYN     Extremities:   No clubbing, cyanosis or edema  Pulses:   2+ and symmetric all extremities  Skin:   Skin color, texture, turgor normal, no rashes or lesions  Lymph nodes:   Cervical, supraclavicular, and axillary nodes normal  Neurologic:   CNII-XII intact, normal strength, sensation and gait; reflexes 2+ and symmetric throughout          Psych:   Normal mood, affect, hygiene and grooming.      Health Maintenance Due  Topic Date Due  . HIV Screening  12/01/1987  . TETANUS/TDAP  12/01/1991  . PAP SMEAR-Modifier  11/30/1993     Lab Results  Component Value Date   TSH 1.397 01/26/2011   Lab Results  Component Value Date   WBC 5.8 01/26/2011   HGB 12.7 01/26/2011   HCT 38.4 01/26/2011   MCV 92.1 01/26/2011   PLT 231 01/26/2011   Lab Results  Component Value Date   NA 138 01/26/2011   K 4.5 01/26/2011   CO2 26 01/26/2011   GLUCOSE 82 01/26/2011   BUN 14 01/26/2011   CREATININE 0.61 01/26/2011   BILITOT 0.6 01/26/2011   ALKPHOS 60 01/26/2011   AST 21 01/26/2011   ALT 17 01/26/2011   PROT 7.5 01/26/2011   ALBUMIN 4.9 01/26/2011   CALCIUM 9.8 01/26/2011   No results found for: CHOL No results found for: HDL No results found for: LDLCALC No results found for: TRIG No results found for: CHOLHDL No results found for: WUJW1X    Assessment & Plan:   Problem List Items Addressed This Visit    Family history of diabetes mellitus   Morbid obesity (HCC)   Relevant Orders   CBC with Differential/Platelet   Comprehensive metabolic panel  Lipid panel    Other Visit Diagnoses    Routine general medical examination at a health care facility    -  Primary   Relevant Orders   CBC with Differential/Platelet   Comprehensive metabolic panel   Lipid panel   Need for Tdap vaccination       Relevant Orders   Tdap vaccine greater than or equal to 7yo IM    Encouraged her to set a goal breast size of roughly 10.  She will also continue with her diet and exercise regimen.  Encouraged her to start low and go slow which she seems to be doing.  Follow-up here as needed.    Follow-up: No follow-ups on file.    Sharlot Gowda, MD

## 2018-04-07 LAB — COMPREHENSIVE METABOLIC PANEL
A/G RATIO: 1.7 (ref 1.2–2.2)
ALBUMIN: 4.7 g/dL (ref 3.5–5.5)
ALK PHOS: 73 IU/L (ref 39–117)
ALT: 34 IU/L — ABNORMAL HIGH (ref 0–32)
AST: 32 IU/L (ref 0–40)
BILIRUBIN TOTAL: 0.6 mg/dL (ref 0.0–1.2)
BUN / CREAT RATIO: 15 (ref 9–23)
BUN: 10 mg/dL (ref 6–24)
CHLORIDE: 103 mmol/L (ref 96–106)
CO2: 23 mmol/L (ref 20–29)
Calcium: 9.7 mg/dL (ref 8.7–10.2)
Creatinine, Ser: 0.65 mg/dL (ref 0.57–1.00)
GFR calc non Af Amer: 108 mL/min/{1.73_m2} (ref >59)
GFR, EST AFRICAN AMERICAN: 124 mL/min/{1.73_m2} (ref >59)
GLOBULIN, TOTAL: 2.7 g/dL (ref 1.5–4.5)
Glucose: 91 mg/dL (ref 65–99)
Potassium: 4.6 mmol/L (ref 3.5–5.2)
SODIUM: 140 mmol/L (ref 134–144)
Total Protein: 7.4 g/dL (ref 6.0–8.5)

## 2018-04-07 LAB — CBC WITH DIFFERENTIAL/PLATELET
BASOS ABS: 0 10*3/uL (ref 0.0–0.2)
Basos: 1 %
EOS (ABSOLUTE): 0.1 10*3/uL (ref 0.0–0.4)
EOS: 2 %
HEMATOCRIT: 38.1 % (ref 34.0–46.6)
HEMOGLOBIN: 12.9 g/dL (ref 11.1–15.9)
Immature Grans (Abs): 0 10*3/uL (ref 0.0–0.1)
Immature Granulocytes: 1 %
Lymphocytes Absolute: 1.4 10*3/uL (ref 0.7–3.1)
Lymphs: 22 %
MCH: 30.4 pg (ref 26.6–33.0)
MCHC: 33.9 g/dL (ref 31.5–35.7)
MCV: 90 fL (ref 79–97)
MONOCYTES: 8 %
Monocytes Absolute: 0.5 10*3/uL (ref 0.1–0.9)
Neutrophils Absolute: 4.4 10*3/uL (ref 1.4–7.0)
Neutrophils: 66 %
Platelets: 274 10*3/uL (ref 150–450)
RBC: 4.25 x10E6/uL (ref 3.77–5.28)
RDW: 13 % (ref 12.3–15.4)
WBC: 6.6 10*3/uL (ref 3.4–10.8)

## 2018-04-07 LAB — LIPID PANEL
CHOLESTEROL TOTAL: 187 mg/dL (ref 100–199)
Chol/HDL Ratio: 3 ratio (ref 0.0–4.4)
HDL: 63 mg/dL (ref >39)
LDL Calculated: 100 mg/dL — ABNORMAL HIGH (ref 0–99)
Triglycerides: 119 mg/dL (ref 0–149)
VLDL Cholesterol Cal: 24 mg/dL (ref 5–40)

## 2019-02-08 ENCOUNTER — Ambulatory Visit: Payer: 59 | Admitting: Family Medicine

## 2019-02-08 ENCOUNTER — Encounter: Payer: Self-pay | Admitting: Family Medicine

## 2019-02-08 ENCOUNTER — Other Ambulatory Visit: Payer: Self-pay

## 2019-02-08 VITALS — BP 128/80 | HR 80 | Temp 97.8°F | Ht 64.0 in | Wt 253.8 lb

## 2019-02-08 DIAGNOSIS — Z833 Family history of diabetes mellitus: Secondary | ICD-10-CM

## 2019-02-08 DIAGNOSIS — K219 Gastro-esophageal reflux disease without esophagitis: Secondary | ICD-10-CM

## 2019-02-08 DIAGNOSIS — Z23 Encounter for immunization: Secondary | ICD-10-CM

## 2019-02-08 DIAGNOSIS — Z0000 Encounter for general adult medical examination without abnormal findings: Secondary | ICD-10-CM | POA: Diagnosis not present

## 2019-02-08 NOTE — Progress Notes (Signed)
   Subjective:    Patient ID: Amber Parrish, female    DOB: 1970/09/18, 48 y.o.   MRN: [id number]  HPI She is here for complete examination.  She sees her gynecologist regularly and has had a mammogram.  She now has a new job and is dealing with Covid.  These have interfered with her exercise regimen.  Her husband does all the cooking.  There is a family history of diabetes.  She also has had intermittent difficulty with reflux symptoms and has been using Tums several times per week.  She has no other concerns or complaints.  Family and social history as well as health maintenance and immunizations was reviewed   Review of Systems  All other systems reviewed and are negative.      Objective:   Physical Exam Alert and in no distress.  EOMI tympanic membranes and canals are normal. Pharyngeal area is normal. Neck is supple without adenopathy or thyromegaly. Cardiac exam shows a regular sinus rhythm without murmurs or gallops. Lungs are clear to auscultation.  Abdominal exam shows normal bowel sounds without masses or tenderness.      Assessment & Plan:  Routine general medical examination at a health care facility - Plan: CBC with Differential/Platelet, Comprehensive metabolic panel, Lipid panel  Morbid obesity ([place]) - Plan: Amb Ref to Medical Weight Management  Family history of diabetes mellitus - Plan: Comprehensive metabolic panel  Gastroesophageal reflux disease, unspecified whether esophagitis present  Need for influenza vaccination - Plan: Flu Vaccine QUAD 6+ mos PF IM (Fluarix Quad PF) I discussed diet and exercise with her.  Recommend 20 minutes of something physical daily or 150 minutes week of something.  Also discussed cutting back on carbohydrates.  We will make a referral to medical weight loss to help both her and her husband.

## 2019-02-08 NOTE — Patient Instructions (Signed)
20 minutes of something physical daily or 150 minutes a week of something.  Work on cutting back on [employer]

## 2019-02-09 LAB — LIPID PANEL
Chol/HDL Ratio: 3 ratio (ref 0.0–4.4)
Cholesterol, Total: 192 mg/dL (ref 100–199)
HDL: 63 mg/dL (ref >39)
LDL Chol Calc (NIH): 108 mg/dL — ABNORMAL HIGH (ref 0–99)
Triglycerides: 120 mg/dL (ref 0–149)
VLDL Cholesterol Cal: 21 mg/dL (ref 5–40)

## 2019-02-09 LAB — CBC WITH DIFFERENTIAL/PLATELET
Basophils Absolute: 0 10*3/uL (ref 0.0–0.2)
Basos: 0 %
EOS (ABSOLUTE): 0.1 10*3/uL (ref 0.0–0.4)
Eos: 1 %
Hematocrit: 39.3 % (ref 34.0–46.6)
Hemoglobin: 12.9 g/dL (ref 11.1–15.9)
Immature Grans (Abs): 0.1 10*3/uL (ref 0.0–0.1)
Immature Granulocytes: 1 %
Lymphocytes Absolute: 1.4 10*3/uL (ref 0.7–3.1)
Lymphs: 22 %
MCH: 29.8 pg (ref 26.6–33.0)
MCHC: 32.8 g/dL (ref 31.5–35.7)
MCV: 91 fL (ref 79–97)
Monocytes Absolute: 0.5 10*3/uL (ref 0.1–0.9)
Monocytes: 8 %
Neutrophils Absolute: 4.1 10*3/uL (ref 1.4–7.0)
Neutrophils: 68 %
Platelets: 251 10*3/uL (ref 150–450)
RBC: 4.33 x10E6/uL (ref 3.77–5.28)
RDW: 12.9 % (ref 11.7–15.4)
WBC: 6.1 10*3/uL (ref 3.4–10.8)

## 2019-02-09 LAB — COMPREHENSIVE METABOLIC PANEL
ALT: 32 IU/L (ref 0–32)
AST: 23 IU/L (ref 0–40)
Albumin/Globulin Ratio: 2 (ref 1.2–2.2)
Albumin: 4.7 g/dL (ref 3.8–4.8)
Alkaline Phosphatase: 84 IU/L (ref 39–117)
BUN/Creatinine Ratio: 22 (ref 9–23)
BUN: 16 mg/dL (ref 6–24)
Bilirubin Total: 0.5 mg/dL (ref 0.0–1.2)
CO2: 24 mmol/L (ref 20–29)
Calcium: 9.5 mg/dL (ref 8.7–10.2)
Chloride: 101 mmol/L (ref 96–106)
Creatinine, Ser: 0.74 mg/dL (ref 0.57–1.00)
GFR calc Af Amer: 112 mL/min/{1.73_m2} (ref >59)
GFR calc non Af Amer: 97 mL/min/{1.73_m2} (ref >59)
Globulin, Total: 2.4 g/dL (ref 1.5–4.5)
Glucose: 98 mg/dL (ref 65–99)
Potassium: 4.7 mmol/L (ref 3.5–5.2)
Sodium: 136 mmol/L (ref 134–144)
Total Protein: 7.1 g/dL (ref 6.0–8.5)

## 2019-05-30 ENCOUNTER — Ambulatory Visit: Payer: 59 | Attending: Internal Medicine

## 2019-05-30 DIAGNOSIS — Z20822 Contact with and (suspected) exposure to covid-19: Secondary | ICD-10-CM

## 2019-05-31 ENCOUNTER — Telehealth: Payer: Self-pay | Admitting: Family Medicine

## 2019-05-31 LAB — NOVEL CORONAVIRUS, NAA: SARS-CoV-2, NAA: NOT DETECTED

## 2019-05-31 NOTE — Telephone Encounter (Signed)
Pt called and wanted to let you know that her COVID test was negative and she wants to know if you thinks she should go back and retested since she was exposed this past weekend

## 2019-06-02 ENCOUNTER — Ambulatory Visit: Payer: 59 | Attending: Internal Medicine

## 2019-06-02 DIAGNOSIS — Z20822 Contact with and (suspected) exposure to covid-19: Secondary | ICD-10-CM

## 2019-06-03 LAB — NOVEL CORONAVIRUS, NAA: SARS-CoV-2, NAA: NOT DETECTED

## 2019-06-05 ENCOUNTER — Ambulatory Visit (INDEPENDENT_AMBULATORY_CARE_PROVIDER_SITE_OTHER): Payer: 59 | Admitting: Family Medicine

## 2019-06-19 ENCOUNTER — Ambulatory Visit (INDEPENDENT_AMBULATORY_CARE_PROVIDER_SITE_OTHER): Payer: 59 | Admitting: Family Medicine

## 2019-07-03 ENCOUNTER — Encounter (INDEPENDENT_AMBULATORY_CARE_PROVIDER_SITE_OTHER): Payer: Self-pay | Admitting: Family Medicine

## 2019-07-03 ENCOUNTER — Other Ambulatory Visit: Payer: Self-pay

## 2019-07-03 ENCOUNTER — Ambulatory Visit (INDEPENDENT_AMBULATORY_CARE_PROVIDER_SITE_OTHER): Payer: 59 | Admitting: Family Medicine

## 2019-07-03 VITALS — BP 128/63 | HR 73 | Temp 98.4°F | Ht 65.0 in | Wt 251.0 lb

## 2019-07-03 DIAGNOSIS — Z9189 Other specified personal risk factors, not elsewhere classified: Secondary | ICD-10-CM | POA: Diagnosis not present

## 2019-07-03 DIAGNOSIS — Z6841 Body Mass Index (BMI) 40.0 and over, adult: Secondary | ICD-10-CM

## 2019-07-03 DIAGNOSIS — R7989 Other specified abnormal findings of blood chemistry: Secondary | ICD-10-CM | POA: Diagnosis not present

## 2019-07-03 DIAGNOSIS — Z0289 Encounter for other administrative examinations: Secondary | ICD-10-CM

## 2019-07-03 DIAGNOSIS — R0602 Shortness of breath: Secondary | ICD-10-CM

## 2019-07-03 DIAGNOSIS — R5383 Other fatigue: Secondary | ICD-10-CM

## 2019-07-03 DIAGNOSIS — Z1331 Encounter for screening for depression: Secondary | ICD-10-CM

## 2019-07-03 DIAGNOSIS — E66813 Obesity, class 3: Secondary | ICD-10-CM

## 2019-07-03 DIAGNOSIS — E7849 Other hyperlipidemia: Secondary | ICD-10-CM

## 2019-07-03 NOTE — Progress Notes (Signed)
Dear Ronnald Nian, MD,   Thank you for referring Fransico Him to our clinic. The following note includes my evaluation and treatment recommendations.  Chief Complaint:   OBESITY Amber Parrish (MR# [id number]) is a 49 y.o. female who presents for evaluation and treatment of obesity and related comorbidities. Current BMI is Body mass index is 41.77 kg/m. Evalyne has been struggling with her weight for many years and has been unsuccessful in either losing weight, maintaining weight loss, or reaching her healthy weight goal.  Jung's husband cooks and shops. For breakfast, she is doing 2 poached eggs, 1/2 bagel, and juice (mango) (feels satisfied). For lunch, she is doing leftover from dinner or egg salad (1 cup) (feels satisfied). For dinner, she is doing grilled chicken sandwiches (1 breast), lettuce, tomato, avocado, bacon, mustard, and mayo. Nekisha is currently in the action stage of change and ready to dedicate time achieving and maintaining a healthier weight. Alveda is interested in becoming our patient and working on intensive lifestyle modifications including (but not limited to) diet and exercise for weight loss.  Kenley's habits were reviewed today and are as follows: Her family eats meals together, she thinks her family will eat healthier with her, her desired weight loss is 71-81 lbs, she has been heavy most of her life, she started gaining weight in her 30's, her heaviest weight ever was 250 pounds, she has significant food cravings issues, she skips meals frequently, she is frequently drinking liquids with calories, she frequently makes poor food choices, she frequently eats larger portions than normal and she struggles with emotional eating.  Depression Screen Sharlee's Food and Mood (modified PHQ-9) score was 16.  Depression screen [facility] 2/9 07/03/2019  Decreased Interest 3  Down, Depressed, Hopeless 0  PHQ - 2 Score 3  Altered sleeping 2  Tired, decreased  energy 3  Change in appetite 3  Feeling bad or failure about yourself  3  Trouble concentrating 0  Moving slowly or fidgety/restless 2  Suicidal thoughts 0  PHQ-9 Score 16  Difficult doing work/chores Somewhat difficult   Subjective:   1. Other fatigue Ayrianna admits to daytime somnolence and denies waking up still tired. Patent has a history of symptoms of daytime fatigue. Kenyatta generally gets 6 or 8 hours of sleep per night, and states that she has generally restful sleep. Snoring is present. Apneic episodes are present. Epworth Sleepiness Score is 5.  2. SOB (shortness of breath) on exertion Markel notes increasing shortness of breath with exercising and seems to be worsening over time with weight gain. She notes getting out of breath sooner with activity than she used to. This has not gotten worse recently. Lorita denies shortness of breath at rest or orthopnea. EKG-normal sinus rhythm at 68 BPM, inverted T wave, V1.  3. Other hyperlipidemia Anouk's LDL was elevated previously.  4. Elevated LFTs Taylan's LFTs was previously elevated. She denies a history of alcoholism.  5. At risk for impaired function of liver Lyanna is at risk for impaired function of liver due to likely diagnosis of fatty liver as evidenced by recent elevated liver enzymes.   Assessment/Plan:   1. Other fatigue Gisell does feel that her weight is causing her energy to be lower than it should be. Fatigue may be related to obesity, depression or many other causes. Labs will be ordered, and in the meanwhile, Jani will focus on self care including making healthy food choices, increasing physical activity and focusing on stress  reduction.  - EKG 12-Lead - CBC with Differential/Platelet - Hemoglobin A1c - Insulin, random - VITAMIN D 25 Hydroxy (Vit-D Deficiency, Fractures) - Vitamin B12 - Folate - T3 - T4, free - TSH  2. SOB (shortness of breath) on exertion Naly does feel that she gets out  of breath more easily that she used to when she exercises. Joeline's shortness of breath appears to be obesity related and exercise induced. She has agreed to work on weight loss and gradually increase exercise to treat her exercise induced shortness of breath. Will continue to monitor closely.  3. Other hyperlipidemia Cardiovascular risk and specific lipid/LDL goals reviewed. We discussed several lifestyle modifications today and Tariana will continue to work on diet, exercise and weight loss efforts. We will check FLP today. Orders and follow up as documented in patient record.   Counseling Intensive lifestyle modifications are the first line treatment for this issue. . Dietary changes: Increase soluble fiber. Decrease simple carbohydrates. . Exercise changes: Moderate to vigorous-intensity aerobic activity 150 minutes per week if tolerated. . Lipid-lowering medications: see documented in medical record.  - Lipid Panel With LDL/HDL Ratio  4. Elevated LFTs We discussed the likely diagnosis of non-alcoholic fatty liver disease today and how this condition is obesity related. Skarlet was educated the importance of weight loss. Vasilisa agreed to continue with her weight loss efforts with healthier diet and exercise as an essential part of her treatment plan. We will check CMP today.  - Comprehensive metabolic panel  5. Depression screening Katy had a positive depression screening. Depression is commonly associated with obesity and often results in emotional eating behaviors. We will monitor this closely and work on CBT to help improve the non-hunger eating patterns. Referral to Psychology may be required if no improvement is seen as she continues in our clinic.  6. At risk for impaired function of liver Lenee was given approximately 15 minutes of counseling today regarding prevention of impaired liver function. Kymari was educated about her risk of developing NASH or even liver failure and  advised that the only proven treatment for NAFLD was weight loss of at least 5-10% of body weight.   7. Class 3 severe obesity with serious comorbidity and body mass index (BMI) of 40.0 to 44.9 in adult, unspecified obesity type (HCC) Young is currently in the action stage of change and her goal is to continue with weight loss efforts. I recommend Sherece begin the structured treatment plan as follows:  She has agreed to the Category 3 Plan.  Exercise goals: No exercise has been prescribed at this time.   Behavioral modification strategies: increasing lean protein intake, increasing vegetables, meal planning and cooking strategies, keeping healthy foods in the home and planning for success.  She was informed of the importance of frequent follow-up visits to maximize her success with intensive lifestyle modifications for her multiple health conditions. She was informed we would discuss her lab results at her next visit unless there is a critical issue that needs to be addressed sooner. Doylene agreed to keep her next visit at the agreed upon time to discuss these results.  Objective:   Blood pressure 128/63, pulse 73, temperature 98.4 F (36.9 C), temperature source Oral, height 5\' 5"  (1.651 m), weight 251 lb (113.9 kg), SpO2 95 %. Body mass index is 41.77 kg/m.  EKG: Normal sinus rhythm, rate 68 BPM.  Indirect Calorimeter completed today shows a VO2 of 267 and a REE of 1858.  Her calculated basal metabolic rate  is 1990 thus her basal metabolic rate is worse than expected.  General: Cooperative, alert, well developed, in no acute distress. HEENT: Conjunctivae and lids unremarkable. Cardiovascular: Regular rhythm.  Lungs: Normal work of breathing. Neurologic: No focal deficits.   Lab Results  Component Value Date   CREATININE 0.74 02/08/2019   BUN 16 02/08/2019   NA 136 02/08/2019   K 4.7 02/08/2019   CL 101 02/08/2019   CO2 24 02/08/2019   Lab Results  Component Value Date    ALT 32 02/08/2019   AST 23 02/08/2019   ALKPHOS 84 02/08/2019   BILITOT 0.5 02/08/2019   No results found for: HGBA1C No results found for: INSULIN Lab Results  Component Value Date   TSH 1.397 01/26/2011   Lab Results  Component Value Date   CHOL 192 02/08/2019   HDL 63 02/08/2019   LDLCALC 108 (H) 02/08/2019   TRIG 120 02/08/2019   CHOLHDL 3.0 02/08/2019   Lab Results  Component Value Date   WBC 6.1 02/08/2019   HGB 12.9 02/08/2019   HCT 39.3 02/08/2019   MCV 91 02/08/2019   PLT 251 02/08/2019   No results found for: IRON, TIBC, FERRITIN  Attestation Statements:  This is the patient's first visit at [facility]. The patient's NEW PATIENT PACKET was reviewed at length. Included in the packet: current and past health history, medications, allergies, ROS, gynecologic history (women only), surgical history, family history, social history, weight history, weight loss surgery history (for those that have had weight loss surgery), nutritional evaluation, mood and food questionnaire, PHQ9, Epworth questionnaire, sleep habits questionnaire, patient life and health improvement goals questionnaire. These will all be scanned into the patient's chart under media.   During the visit, I independently reviewed the patient's EKG, bioimpedance scale results, and indirect calorimeter results. I used this information to tailor a meal plan for the patient that will help her to lose weight and will improve her obesity-related conditions going forward. I performed a medically necessary appropriate examination and/or evaluation. I discussed the assessment and treatment plan with the patient. The patient was provided an opportunity to ask questions and all were answered. The patient agreed with the plan and demonstrated an understanding of the instructions. Labs were ordered at this visit and will be reviewed at the next visit unless more critical results need to be addressed immediately.  Clinical information was updated and documented in the EMR.   Time spent on visit including pre-visit chart review and post-visit care was 40 minutes.   A separate 15 minutes was spent on risk counseling (see above).     I, Burt Knack, am acting as transcriptionist for Debbra Riding, MD.  I have reviewed the above documentation for accuracy and completeness, and I agree with the above. - Debbra Riding, MD

## 2019-07-04 LAB — COMPREHENSIVE METABOLIC PANEL
ALT: 16 IU/L (ref 0–32)
AST: 20 IU/L (ref 0–40)
Albumin/Globulin Ratio: 1.9 (ref 1.2–2.2)
Albumin: 4.7 g/dL (ref 3.8–4.8)
Alkaline Phosphatase: 84 IU/L (ref 39–117)
BUN/Creatinine Ratio: 19 (ref 9–23)
BUN: 13 mg/dL (ref 6–24)
Bilirubin Total: 0.6 mg/dL (ref 0.0–1.2)
CO2: 20 mmol/L (ref 20–29)
Calcium: 9.5 mg/dL (ref 8.7–10.2)
Chloride: 105 mmol/L (ref 96–106)
Creatinine, Ser: 0.67 mg/dL (ref 0.57–1.00)
GFR calc Af Amer: 122 mL/min/{1.73_m2} (ref >59)
GFR calc non Af Amer: 106 mL/min/{1.73_m2} (ref >59)
Globulin, Total: 2.5 g/dL (ref 1.5–4.5)
Glucose: 99 mg/dL (ref 65–99)
Potassium: 4.5 mmol/L (ref 3.5–5.2)
Sodium: 140 mmol/L (ref 134–144)
Total Protein: 7.2 g/dL (ref 6.0–8.5)

## 2019-07-04 LAB — TSH: TSH: 1.05 u[IU]/mL (ref 0.450–4.500)

## 2019-07-04 LAB — CBC WITH DIFFERENTIAL/PLATELET
Basophils Absolute: 0.1 10*3/uL (ref 0.0–0.2)
Basos: 1 %
EOS (ABSOLUTE): 0.1 10*3/uL (ref 0.0–0.4)
Eos: 2 %
Hematocrit: 38.5 % (ref 34.0–46.6)
Hemoglobin: 13.3 g/dL (ref 11.1–15.9)
Immature Grans (Abs): 0 10*3/uL (ref 0.0–0.1)
Immature Granulocytes: 1 %
Lymphocytes Absolute: 1.4 10*3/uL (ref 0.7–3.1)
Lymphs: 27 %
MCH: 30.4 pg (ref 26.6–33.0)
MCHC: 34.5 g/dL (ref 31.5–35.7)
MCV: 88 fL (ref 79–97)
Monocytes Absolute: 0.3 10*3/uL (ref 0.1–0.9)
Monocytes: 7 %
Neutrophils Absolute: 3.1 10*3/uL (ref 1.4–7.0)
Neutrophils: 62 %
Platelets: 276 10*3/uL (ref 150–450)
RBC: 4.37 x10E6/uL (ref 3.77–5.28)
RDW: 13.3 % (ref 11.7–15.4)
WBC: 5 10*3/uL (ref 3.4–10.8)

## 2019-07-04 LAB — LIPID PANEL WITH LDL/HDL RATIO
Cholesterol, Total: 185 mg/dL (ref 100–199)
HDL: 64 mg/dL (ref >39)
LDL Chol Calc (NIH): 105 mg/dL — ABNORMAL HIGH (ref 0–99)
LDL/HDL Ratio: 1.6 ratio (ref 0.0–3.2)
Triglycerides: 91 mg/dL (ref 0–149)
VLDL Cholesterol Cal: 16 mg/dL (ref 5–40)

## 2019-07-04 LAB — FOLATE: Folate: >20 ng/mL (ref >3.0)

## 2019-07-04 LAB — HEMOGLOBIN A1C
Est. average glucose Bld gHb Est-mCnc: 117 mg/dL
Hgb A1c MFr Bld: 5.7 % — ABNORMAL HIGH (ref 4.8–5.6)

## 2019-07-04 LAB — INSULIN, RANDOM: INSULIN: 16.7 u[IU]/mL (ref 2.6–24.9)

## 2019-07-04 LAB — T3: T3, Total: 103 ng/dL (ref 71–180)

## 2019-07-04 LAB — VITAMIN D 25 HYDROXY (VIT D DEFICIENCY, FRACTURES): Vit D, 25-Hydroxy: 28 ng/mL — ABNORMAL LOW (ref 30.0–100.0)

## 2019-07-04 LAB — T4, FREE: Free T4: 1.34 ng/dL (ref 0.82–1.77)

## 2019-07-04 LAB — VITAMIN B12: Vitamin B-12: 536 pg/mL (ref 232–1245)

## 2019-07-11 ENCOUNTER — Ambulatory Visit (INDEPENDENT_AMBULATORY_CARE_PROVIDER_SITE_OTHER): Payer: 59 | Admitting: Family Medicine

## 2019-07-11 ENCOUNTER — Encounter (INDEPENDENT_AMBULATORY_CARE_PROVIDER_SITE_OTHER): Payer: Self-pay | Admitting: Family Medicine

## 2019-07-11 ENCOUNTER — Other Ambulatory Visit: Payer: Self-pay

## 2019-07-11 VITALS — BP 128/84 | HR 90 | Temp 98.1°F | Ht 65.0 in | Wt 248.0 lb

## 2019-07-11 DIAGNOSIS — Z9189 Other specified personal risk factors, not elsewhere classified: Secondary | ICD-10-CM

## 2019-07-11 DIAGNOSIS — Z6841 Body Mass Index (BMI) 40.0 and over, adult: Secondary | ICD-10-CM

## 2019-07-11 DIAGNOSIS — R7303 Prediabetes: Secondary | ICD-10-CM | POA: Diagnosis not present

## 2019-07-11 DIAGNOSIS — E7849 Other hyperlipidemia: Secondary | ICD-10-CM

## 2019-07-11 DIAGNOSIS — E559 Vitamin D deficiency, unspecified: Secondary | ICD-10-CM | POA: Diagnosis not present

## 2019-07-11 NOTE — Progress Notes (Signed)
Chief Complaint:   OBESITY Amber Parrish is here to discuss her progress with her obesity treatment plan along with follow-up of her obesity related diagnoses. Amber Parrish is on the Category 3 Plan and states she is following her eating plan approximately 80-85% of the time. Amber Parrish states she is walking 10,000 steps 7 times per week.  Today's visit was #: 2 Starting weight: 251 lbs Starting date: 07/03/2019 Today's weight: 248 lbs Today's date: 07/11/2019 Total lbs lost to date: 3 lbs Total lbs lost since last in-office visit: 3 lbs  Interim History: Amber Parrish has difficulty snacking during the day.  She also finds the dinner meal difficult.  She is doing individual cottage cheese, cheese sticks, or an apple.  She is finding the sandwich option at lunch to not be doable.  No increase in hunger.  She is going to [facility] for 9 days.  No cravings.  Subjective:   1. Vitamin D deficiency Amber Parrish's Vitamin D level was 28.0 on 07/03/2019. She is not currently taking vit D. She denies nausea, vomiting or muscle weakness.   2. Prediabetes Amber Parrish has a diagnosis of prediabetes based on her elevated HgA1c and was informed this puts her at greater risk of developing diabetes. She continues to work on diet and exercise to decrease her risk of diabetes. She denies nausea or hypoglycemia. She is not taking metformin.  Lab Results  Component Value Date   HGBA1C 5.7 (H) 07/03/2019   Lab Results  Component Value Date   INSULIN 16.7 07/03/2019   3. Other hyperlipidemia Amber Parrish has hyperlipidemia and has been trying to improve her cholesterol levels with intensive lifestyle modification including a low saturated fat diet, exercise and weight loss. She denies any chest pain, claudication or myalgias.  ASCVD risk 0.6% in 10 years.  Lab Results  Component Value Date   ALT 16 07/03/2019   AST 20 07/03/2019   ALKPHOS 84 07/03/2019   BILITOT 0.6 07/03/2019   Lab Results  Component Value Date   CHOL  185 07/03/2019   HDL 64 07/03/2019   LDLCALC 105 (H) 07/03/2019   TRIG 91 07/03/2019   CHOLHDL 3.0 02/08/2019   4. At risk for diabetes mellitus Amber Parrish is at higher than average risk for developing diabetes due to her obesity.   Assessment/Plan:   1. Vitamin D deficiency Low Vitamin D level contributes to fatigue and are associated with obesity, breast, and colon cancer. She agrees to start to take prescription Vitamin D @50 ,000 IU every week and will follow-up for routine testing of Vitamin D, at least 2-3 times per year to avoid over-replacement. - Vitamin D, Ergocalciferol, (DRISDOL) 1.25 MG (50000 UNIT) CAPS capsule; Take 1 capsule (50,000 Units total) by mouth every 7 (seven) days.  Dispense: 4 capsule; Refill: 0  2. Prediabetes Amber Parrish will continue to work on weight loss, exercise, and decreasing simple carbohydrates to help decrease the risk of diabetes.  We will revisit medication options if there is an increase in hunger or carb cravings at a later appointment.   3. Other hyperlipidemia Cardiovascular risk and specific lipid/LDL goals reviewed.  We discussed several lifestyle modifications today and Amber Parrish will continue to work on diet, exercise and weight loss efforts. Orders and follow up as documented in patient record.    Counseling Intensive lifestyle modifications are the first line treatment for this issue. . Dietary changes: Increase soluble fiber. Decrease simple carbohydrates. . Exercise changes: Moderate to vigorous-intensity aerobic activity 150 minutes per week if tolerated. Shanda Bumps  Lipid-lowering medications: see documented in medical record.  4. At risk for diabetes mellitus Amber Parrish was given approximately 15 minutes of diabetes education and counseling today. We discussed intensive lifestyle modifications today with an emphasis on weight loss as well as increasing exercise and decreasing simple carbohydrates in her diet. We also reviewed medication options with an  emphasis on risk versus benefit of those discussed.   Repetitive spaced learning was employed today to elicit superior memory formation and behavioral change.  5. Class 3 severe obesity with serious comorbidity and body mass index (BMI) of 40.0 to 44.9 in adult, unspecified obesity type (HCC) Amber Parrish is currently in the action stage of change. As such, her goal is to continue with weight loss efforts. She has agreed to the Category 3 Plan.   Exercise goals: As is.  Behavioral modification strategies: increasing lean protein intake, increasing vegetables and travel eating strategies.  Amber Parrish has agreed to follow-up with our clinic in 2 weeks. She was informed of the importance of frequent follow-up visits to maximize her success with intensive lifestyle modifications for her multiple health conditions.   Objective:   Blood pressure 128/84, pulse 90, temperature 98.1 F (36.7 C), temperature source Oral, height 5\' 5"  (1.651 m), weight 248 lb (112.5 kg), SpO2 96 %. Body mass index is 41.27 kg/m.  General: Cooperative, alert, well developed, in no acute distress. HEENT: Conjunctivae and lids unremarkable. Cardiovascular: Regular rhythm.  Lungs: Normal work of breathing. Neurologic: No focal deficits.   Lab Results  Component Value Date   CREATININE 0.67 07/03/2019   BUN 13 07/03/2019   NA 140 07/03/2019   K 4.5 07/03/2019   CL 105 07/03/2019   CO2 20 07/03/2019   Lab Results  Component Value Date   ALT 16 07/03/2019   AST 20 07/03/2019   ALKPHOS 84 07/03/2019   BILITOT 0.6 07/03/2019   Lab Results  Component Value Date   HGBA1C 5.7 (H) 07/03/2019   Lab Results  Component Value Date   INSULIN 16.7 07/03/2019   Lab Results  Component Value Date   TSH 1.050 07/03/2019   Lab Results  Component Value Date   CHOL 185 07/03/2019   HDL 64 07/03/2019   LDLCALC 105 (H) 07/03/2019   TRIG 91 07/03/2019   CHOLHDL 3.0 02/08/2019   Lab Results  Component Value Date   WBC  5.0 07/03/2019   HGB 13.3 07/03/2019   HCT 38.5 07/03/2019   MCV 88 07/03/2019   PLT 276 07/03/2019   Attestation Statements:   Reviewed by clinician on day of visit: allergies, medications, problem list, medical history, surgical history, family history, social history, and previous encounter notes.  I, Water quality scientist, CMA, am acting as transcriptionist for Coralie Common, MD.  I have reviewed the above documentation for accuracy and completeness, and I agree with the above. - Ilene Qua, MD

## 2019-07-12 MED ORDER — VITAMIN D (ERGOCALCIFEROL) 1.25 MG (50000 UNIT) PO CAPS: 50000.0000 [IU] | ORAL_CAPSULE | ORAL | 0 refills | Status: DC

## 2019-07-17 ENCOUNTER — Ambulatory Visit (INDEPENDENT_AMBULATORY_CARE_PROVIDER_SITE_OTHER): Payer: 59 | Admitting: Family Medicine

## 2019-07-27 ENCOUNTER — Ambulatory Visit (INDEPENDENT_AMBULATORY_CARE_PROVIDER_SITE_OTHER): Payer: 59 | Admitting: Family Medicine

## 2019-07-27 ENCOUNTER — Other Ambulatory Visit: Payer: Self-pay

## 2019-07-27 ENCOUNTER — Encounter (INDEPENDENT_AMBULATORY_CARE_PROVIDER_SITE_OTHER): Payer: Self-pay | Admitting: Family Medicine

## 2019-07-27 VITALS — BP 118/77 | HR 77 | Temp 98.0°F | Ht 65.0 in | Wt 245.0 lb

## 2019-07-27 DIAGNOSIS — E66813 Obesity, class 3: Secondary | ICD-10-CM

## 2019-07-27 DIAGNOSIS — R7303 Prediabetes: Secondary | ICD-10-CM

## 2019-07-27 DIAGNOSIS — E559 Vitamin D deficiency, unspecified: Secondary | ICD-10-CM

## 2019-07-27 DIAGNOSIS — Z6841 Body Mass Index (BMI) 40.0 and over, adult: Secondary | ICD-10-CM | POA: Diagnosis not present

## 2019-07-27 NOTE — Progress Notes (Signed)
Chief Complaint:   OBESITY Amber Parrish is here to discuss her progress with her obesity treatment plan along with follow-up of her obesity related diagnoses. Amber Parrish is on the Category 3 Plan and states she is following her eating plan approximately 75-80% of the time. Amber Parrish states she is walking 5000-10,000 steps 6 days per week.  Today's visit was #: 3 Starting weight: 251 lbs Starting date: 07/03/2019 Today's weight: 245 lbs Today's date: 07/27/2019 Total lbs lost to date: 6 lbs Total lbs lost since last in-office visit: 3 lbs  Interim History: Amber Parrish went to the beach for about a week.  She found following the plan to be difficult, but her husband went grocery shopping to get more options that were on the plan.  She voices some stress with waiting to find out where her son will go to college.  Subjective:   1. Vitamin D deficiency Amber Parrish's Vitamin D level was 28.0 on 07/03/2019. She is currently taking prescription vitamin D 50,000 IU each week. She denies nausea, vomiting or muscle weakness.  She endorses fatigue.  2. Prediabetes Amber Parrish has a diagnosis of prediabetes based on her elevated HgA1c and was informed this puts her at greater risk of developing diabetes. She continues to work on diet and exercise to decrease her risk of diabetes. She denies nausea or hypoglycemia.  She is not on metformin at this time.  Occasional carb cravings.  Lab Results  Component Value Date   HGBA1C 5.7 (H) 07/03/2019   Lab Results  Component Value Date   INSULIN 16.7 07/03/2019   Assessment/Plan:   1. Vitamin D deficiency Low Vitamin D level contributes to fatigue and are associated with obesity, breast, and colon cancer. She agrees to continue to take prescription Vitamin D @50 ,000 IU every week and will follow-up for routine testing of Vitamin D, at least 2-3 times per year to avoid over-replacement.  2. Prediabetes Amber Parrish will continue to work on weight loss, exercise, and  decreasing simple carbohydrates to help decrease the risk of diabetes.  Will repeat labs in 2 months.  3. Class 3 severe obesity with serious comorbidity and body mass index (BMI) of 40.0 to 44.9 in adult, unspecified obesity type (HCC) Amber Parrish is currently in the action stage of change. As such, her goal is to continue with weight loss efforts. She has agreed to the Category 3 Plan with breakfast options.   Exercise goals: For substantial health benefits, adults should do at least 150 minutes (2 hours and 30 minutes) a week of moderate-intensity, or 75 minutes (1 hour and 15 minutes) a week of vigorous-intensity aerobic physical activity, or an equivalent combination of moderate- and vigorous-intensity aerobic activity. Aerobic activity should be performed in episodes of at least 10 minutes, and preferably, it should be spread throughout the week.  Behavioral modification strategies: increasing lean protein intake, increasing vegetables, meal planning and cooking strategies, keeping healthy foods in the home and planning for success.  Amber Parrish has agreed to follow-up with our clinic in 2 weeks. She was informed of the importance of frequent follow-up visits to maximize her success with intensive lifestyle modifications for her multiple health conditions.   Objective:   Blood pressure 118/77, pulse 77, temperature 98 F (36.7 C), temperature source Oral, height 5\' 5"  (1.651 m), weight 245 lb (111.1 kg), SpO2 95 %. Body mass index is 40.77 kg/m.  General: Cooperative, alert, well developed, in no acute distress. HEENT: Conjunctivae and lids unremarkable. Cardiovascular: Regular rhythm.  Lungs: Normal  work of breathing. Neurologic: No focal deficits.   Lab Results  Component Value Date   CREATININE 0.67 07/03/2019   BUN 13 07/03/2019   NA 140 07/03/2019   K 4.5 07/03/2019   CL 105 07/03/2019   CO2 20 07/03/2019   Lab Results  Component Value Date   ALT 16 07/03/2019   AST 20  07/03/2019   ALKPHOS 84 07/03/2019   BILITOT 0.6 07/03/2019   Lab Results  Component Value Date   HGBA1C 5.7 (H) 07/03/2019   Lab Results  Component Value Date   INSULIN 16.7 07/03/2019   Lab Results  Component Value Date   TSH 1.050 07/03/2019   Lab Results  Component Value Date   CHOL 185 07/03/2019   HDL 64 07/03/2019   LDLCALC 105 (H) 07/03/2019   TRIG 91 07/03/2019   CHOLHDL 3.0 02/08/2019   Lab Results  Component Value Date   WBC 5.0 07/03/2019   HGB 13.3 07/03/2019   HCT 38.5 07/03/2019   MCV 88 07/03/2019   PLT 276 07/03/2019   Attestation Statements:   Reviewed by clinician on day of visit: allergies, medications, problem list, medical history, surgical history, family history, social history, and previous encounter notes.  Time spent on visit including pre-visit chart review and post-visit care and charting was 15 minutes.   I, Water quality scientist, CMA, am acting as transcriptionist for Coralie Common, MD.  I have reviewed the above documentation for accuracy and completeness, and I agree with the above. - Ilene Qua, MD

## 2019-08-10 ENCOUNTER — Encounter (INDEPENDENT_AMBULATORY_CARE_PROVIDER_SITE_OTHER): Payer: Self-pay | Admitting: Family Medicine

## 2019-08-10 ENCOUNTER — Other Ambulatory Visit: Payer: Self-pay

## 2019-08-10 ENCOUNTER — Ambulatory Visit (INDEPENDENT_AMBULATORY_CARE_PROVIDER_SITE_OTHER): Payer: 59 | Admitting: Family Medicine

## 2019-08-10 VITALS — BP 130/84 | HR 67 | Temp 97.5°F | Ht 65.0 in | Wt 243.0 lb

## 2019-08-10 DIAGNOSIS — R7303 Prediabetes: Secondary | ICD-10-CM | POA: Diagnosis not present

## 2019-08-10 DIAGNOSIS — Z6841 Body Mass Index (BMI) 40.0 and over, adult: Secondary | ICD-10-CM

## 2019-08-10 DIAGNOSIS — E559 Vitamin D deficiency, unspecified: Secondary | ICD-10-CM

## 2019-08-14 NOTE — Progress Notes (Signed)
Chief Complaint:   OBESITY Amber Parrish is here to discuss her progress with her obesity treatment plan along with follow-up of her obesity related diagnoses. Amber Parrish is on the Category 3 Plan and states she is following her eating plan approximately 85% of the time. Amber Parrish states she is walking 8000-9000 steps 5 times per week.  Today's visit was #: 4 Starting weight: 251 lbs Starting date: 07/03/2019 Today's weight: 243 lbs Today's date: 08/10/2019 Total lbs lost to date: 8 lbs Total lbs lost since last in-office visit: 2 lbs  Interim History: Amber Parrish says the last few weeks went well.  She had an indulgence of key lime cake.  Amber Parrish voices that she had a bit of a stomach ache after the cake.  Her son plays lacrosse, and she has been having fruit as a snack.  She is finding that she has gotten into a routine of eating.  Subjective:   1. Prediabetes Mirel has a diagnosis of prediabetes based on her elevated HgA1c and was informed this puts her at greater risk of developing diabetes. She continues to work on diet and exercise to decrease her risk of diabetes. She denies nausea or hypoglycemia.  She is not taking metformin.  Lab Results  Component Value Date   HGBA1C 5.7 (H) 07/03/2019   Lab Results  Component Value Date   INSULIN 16.7 07/03/2019   2. Vitamin D deficiency Amber Parrish's Vitamin D level was 28.0 on 07/03/2019. She is currently taking prescription vitamin D 50,000 IU each week. She denies nausea, vomiting or muscle weakness.  She endorses fatigue.  Assessment/Plan:   1. Prediabetes Amber Parrish will continue to work on weight loss, exercise, and decreasing simple carbohydrates to help decrease the risk of diabetes.   2. Vitamin D deficiency Low Vitamin D level contributes to fatigue and are associated with obesity, breast, and colon cancer. She agrees to continue to take prescription Vitamin D @50 ,000 IU every week and will follow-up for routine testing of Vitamin D, at  least 2-3 times per year to avoid over-replacement.  3. Class 3 severe obesity with serious comorbidity and body mass index (BMI) of 40.0 to 44.9 in adult, unspecified obesity type (HCC) Amber Parrish is currently in the action stage of change. As such, her goal is to continue with weight loss efforts. She has agreed to the Category 3 Plan.   Exercise goals: As is.  Behavioral modification strategies: increasing lean protein intake, increasing vegetables, meal planning and cooking strategies, better snacking choices and planning for success.  Amber Parrish has agreed to follow-up with our clinic in 2 weeks. She was informed of the importance of frequent follow-up visits to maximize her success with intensive lifestyle modifications for her multiple health conditions.   Objective:   Blood pressure 130/84, pulse 67, temperature (!) 97.5 F (36.4 C), temperature source Oral, height 5\' 5"  (1.651 m), weight 243 lb (110.2 kg), SpO2 98 %. Body mass index is 40.44 kg/m.  General: Cooperative, alert, well developed, in no acute distress. HEENT: Conjunctivae and lids unremarkable. Cardiovascular: Regular rhythm.  Lungs: Normal work of breathing. Neurologic: No focal deficits.   Lab Results  Component Value Date   CREATININE 0.67 07/03/2019   BUN 13 07/03/2019   NA 140 07/03/2019   K 4.5 07/03/2019   CL 105 07/03/2019   CO2 20 07/03/2019   Lab Results  Component Value Date   ALT 16 07/03/2019   AST 20 07/03/2019   ALKPHOS 84 07/03/2019   BILITOT 0.6 07/03/2019  Lab Results  Component Value Date   HGBA1C 5.7 (H) 07/03/2019   Lab Results  Component Value Date   INSULIN 16.7 07/03/2019   Lab Results  Component Value Date   TSH 1.050 07/03/2019   Lab Results  Component Value Date   CHOL 185 07/03/2019   HDL 64 07/03/2019   LDLCALC 105 (H) 07/03/2019   TRIG 91 07/03/2019   CHOLHDL 3.0 02/08/2019   Lab Results  Component Value Date   WBC 5.0 07/03/2019   HGB 13.3 07/03/2019   HCT  38.5 07/03/2019   MCV 88 07/03/2019   PLT 276 07/03/2019   Attestation Statements:   Reviewed by clinician on day of visit: allergies, medications, problem list, medical history, surgical history, family history, social history, and previous encounter notes.  Time spent on visit including pre-visit chart review and post-visit care and charting was 15 minutes.   I, Insurance claims handler, CMA, am acting as transcriptionist for Reuben Likes, MD. I have reviewed the above documentation for accuracy and completeness, and I agree with the above. - Debbra Riding, MD

## 2019-08-24 ENCOUNTER — Encounter (INDEPENDENT_AMBULATORY_CARE_PROVIDER_SITE_OTHER): Payer: Self-pay | Admitting: Family Medicine

## 2019-08-24 ENCOUNTER — Ambulatory Visit (INDEPENDENT_AMBULATORY_CARE_PROVIDER_SITE_OTHER): Payer: 59 | Admitting: Family Medicine

## 2019-08-24 ENCOUNTER — Other Ambulatory Visit: Payer: Self-pay

## 2019-08-24 VITALS — BP 141/70 | HR 74 | Temp 97.7°F | Ht 65.0 in | Wt 242.0 lb

## 2019-08-24 DIAGNOSIS — R7303 Prediabetes: Secondary | ICD-10-CM

## 2019-08-24 DIAGNOSIS — Z9189 Other specified personal risk factors, not elsewhere classified: Secondary | ICD-10-CM

## 2019-08-24 DIAGNOSIS — E7849 Other hyperlipidemia: Secondary | ICD-10-CM | POA: Diagnosis not present

## 2019-08-24 DIAGNOSIS — Z6841 Body Mass Index (BMI) 40.0 and over, adult: Secondary | ICD-10-CM

## 2019-08-24 MED ORDER — METFORMIN HCL 500 MG PO TABS: 500.0000 mg | ORAL_TABLET | Freq: Every day | ORAL | 0 refills | Status: DC

## 2019-08-28 NOTE — Progress Notes (Signed)
Chief Complaint:   OBESITY Amber Parrish is here to discuss her progress with her obesity treatment plan along with follow-up of her obesity related diagnoses. Amber Parrish is on the Category 3 Plan and states she is following her eating plan approximately 70% of the time. Amber Parrish states she is doing 50 sit-ups and walking for 30 minutes 5 times per week.  Today's visit was #: 5 Starting weight: 251 lbs Starting date: 07/03/2019 Today's weight: 242 lbs Today's date: 08/24/2019 Total lbs lost to date: 9 lbs Total lbs lost since last in-office visit: 1 lb  Interim History: Amber Parrish says that she felt well last week but struggled with staying on plan after starting indulgent eating.  She realized one indulgence led to another, to another, etc.  She is going to her grandparents house this weekend for Mother's Day.  Subjective:   1. Prediabetes Amber Parrish has a diagnosis of prediabetes based on her elevated HgA1c and was informed this puts her at greater risk of developing diabetes. She continues to work on diet and exercise to decrease her risk of diabetes. She denies nausea or hypoglycemia.  Weight loss is not where it is expected (9 pounds in 10 weeks).  Lab Results  Component Value Date   HGBA1C 5.7 (H) 07/03/2019   Lab Results  Component Value Date   INSULIN 16.7 07/03/2019   2. Other hyperlipidemia Amber Parrish has hyperlipidemia and has been trying to improve her cholesterol levels with intensive lifestyle modification including a low saturated fat diet, exercise and weight loss. She denies any chest pain, claudication or myalgias.  She is not on a statin.  Lab Results  Component Value Date   ALT 16 07/03/2019   AST 20 07/03/2019   ALKPHOS 84 07/03/2019   BILITOT 0.6 07/03/2019   Lab Results  Component Value Date   CHOL 185 07/03/2019   HDL 64 07/03/2019   LDLCALC 105 (H) 07/03/2019   TRIG 91 07/03/2019   CHOLHDL 3.0 02/08/2019   3. At risk for diabetes mellitus Amber Parrish is at higher  than average risk for developing diabetes due to her obesity.   Assessment/Plan:   1. Prediabetes Amber Parrish will continue to work on weight loss, exercise, and decreasing simple carbohydrates to help decrease the risk of diabetes.  Amber Parrish will start metformin 500 mg daily. - metFORMIN (GLUCOPHAGE) 500 MG tablet; Take 1 tablet (500 mg total) by mouth daily with breakfast.  Dispense: 30 tablet; Refill: 0  2. Other hyperlipidemia Cardiovascular risk and specific lipid/LDL goals reviewed.  We discussed several lifestyle modifications today and Amber Parrish will continue to work on diet, exercise and weight loss efforts. Orders and follow up as documented in patient record.   Counseling Intensive lifestyle modifications are the first line treatment for this issue. . Dietary changes: Increase soluble fiber. Decrease simple carbohydrates. . Exercise changes: Moderate to vigorous-intensity aerobic activity 150 minutes per week if tolerated. . Lipid-lowering medications: see documented in medical record.  3. At risk for diabetes mellitus Amber Parrish was given approximately 15 minutes of diabetes education and counseling today. We discussed intensive lifestyle modifications today with an emphasis on weight loss as well as increasing exercise and decreasing simple carbohydrates in her diet. We also reviewed medication options with an emphasis on risk versus benefit of those discussed.   Repetitive spaced learning was employed today to elicit superior memory formation and behavioral change.  4. Class 3 severe obesity with serious comorbidity and body mass index (BMI) of 40.0 to 44.9 in adult,  unspecified obesity type Amber Parrish) Amber Parrish is currently in the action stage of change. As such, her goal is to continue with weight loss efforts. She has agreed to the Category 3 Plan.   Exercise goals: Start 10-15 minute resistance training 3 times per week.  Behavioral modification strategies: increasing lean protein intake,  increasing vegetables, meal planning and cooking strategies, keeping healthy foods in the home and planning for success.  Amber Parrish has agreed to follow-up with our clinic in 2 weeks. She was informed of the importance of frequent follow-up visits to maximize her success with intensive lifestyle modifications for her multiple health conditions.   Objective:   Blood pressure (!) 141/70, pulse 74, temperature 97.7 F (36.5 C), temperature source Oral, height 5\' 5"  (1.651 m), weight 242 lb (109.8 kg), SpO2 96 %. Body mass index is 40.27 kg/m.  General: Cooperative, alert, well developed, in no acute distress. HEENT: Conjunctivae and lids unremarkable. Cardiovascular: Regular rhythm.  Lungs: Normal work of breathing. Neurologic: No focal deficits.   Lab Results  Component Value Date   CREATININE 0.67 07/03/2019   BUN 13 07/03/2019   NA 140 07/03/2019   K 4.5 07/03/2019   CL 105 07/03/2019   CO2 20 07/03/2019   Lab Results  Component Value Date   ALT 16 07/03/2019   AST 20 07/03/2019   ALKPHOS 84 07/03/2019   BILITOT 0.6 07/03/2019   Lab Results  Component Value Date   HGBA1C 5.7 (H) 07/03/2019   Lab Results  Component Value Date   INSULIN 16.7 07/03/2019   Lab Results  Component Value Date   TSH 1.050 07/03/2019   Lab Results  Component Value Date   CHOL 185 07/03/2019   HDL 64 07/03/2019   LDLCALC 105 (H) 07/03/2019   TRIG 91 07/03/2019   CHOLHDL 3.0 02/08/2019   Lab Results  Component Value Date   WBC 5.0 07/03/2019   HGB 13.3 07/03/2019   HCT 38.5 07/03/2019   MCV 88 07/03/2019   PLT 276 07/03/2019   Attestation Statements:   Reviewed by clinician on day of visit: allergies, medications, problem list, medical history, surgical history, family history, social history, and previous encounter notes.  I, Water quality scientist, CMA, am acting as transcriptionist for Coralie Common, MD.  I have reviewed the above documentation for accuracy and completeness, and I  agree with the above. - Jinny Blossom, MD

## 2019-09-12 ENCOUNTER — Other Ambulatory Visit: Payer: Self-pay

## 2019-09-12 ENCOUNTER — Ambulatory Visit (INDEPENDENT_AMBULATORY_CARE_PROVIDER_SITE_OTHER): Payer: 59 | Admitting: Family Medicine

## 2019-09-12 ENCOUNTER — Encounter (INDEPENDENT_AMBULATORY_CARE_PROVIDER_SITE_OTHER): Payer: Self-pay | Admitting: Family Medicine

## 2019-09-12 VITALS — BP 131/88 | HR 78 | Temp 98.3°F | Ht 65.0 in | Wt 239.0 lb

## 2019-09-12 DIAGNOSIS — E559 Vitamin D deficiency, unspecified: Secondary | ICD-10-CM | POA: Diagnosis not present

## 2019-09-12 DIAGNOSIS — Z9189 Other specified personal risk factors, not elsewhere classified: Secondary | ICD-10-CM | POA: Diagnosis not present

## 2019-09-12 DIAGNOSIS — R7303 Prediabetes: Secondary | ICD-10-CM

## 2019-09-12 DIAGNOSIS — Z6839 Body mass index (BMI) 39.0-39.9, adult: Secondary | ICD-10-CM

## 2019-09-12 MED ORDER — VITAMIN D (ERGOCALCIFEROL) 1.25 MG (50000 UNIT) PO CAPS: 50000.0000 [IU] | ORAL_CAPSULE | ORAL | 0 refills | Status: DC

## 2019-09-12 NOTE — Progress Notes (Signed)
Chief Complaint:   [place] is here to discuss her progress with her obesity treatment plan along with follow-up of her obesity related diagnoses. Shuronda is on the Category 3 Plan and states she is following her eating plan approximately 75% of the time. Teka states she is doing exercises and walking 30 minutes 5 times per week.  Today's visit was #: 6 Starting weight: 251 lbs Starting date: 07/03/2019 Today's weight: 239 lbs Today's date: 09/12/2019 Total lbs lost to date: 12 Total lbs lost since last in-office visit: 3  Interim History: Courtni is experiencing some hunger after lunch. She feels slightly bored with breakfast. She reports often getting in 8 oz of meat at dinner. She has no plans for the [facility] Day weekend. She is interested in some variety of foods for lunch and dinner.  Subjective:   Prediabetes. Pegeen has a diagnosis of prediabetes based on her elevated HgA1c and was informed this puts her at greater risk of developing diabetes. She continues to work on diet and exercise to decrease her risk of diabetes. She denies nausea or hypoglycemia. Jule is not feeling well on medication - nausea, cramping, decreased energy. She reports minimal indulgent eating.  Lab Results  Component Value Date   HGBA1C 5.7 (H) 07/03/2019   Lab Results  Component Value Date   INSULIN 16.7 07/03/2019   Vitamin D deficiency. No nausea, vomiting, or muscle weakness. Shadell endorses fatigue. She is on prescription Vitamin D supplementation. Last Vitamin D 28.0 on 07/03/2019.  At risk for osteoporosis. Zaylei is at higher risk of osteopenia and osteoporosis due to Vitamin D deficiency.   Assessment/Plan:   Prediabetes. Tanai will continue to work on weight loss, exercise, and decreasing simple carbohydrates to help decrease the risk of diabetes. She was instructed to stop taking metformin.  Vitamin D deficiency. Low Vitamin D level contributes to fatigue  and are associated with obesity, breast, and colon cancer. She was given a refill on her Vitamin D, Ergocalciferol, (DRISDOL) 1.25 MG (50000 UNIT) CAPS capsule every week #4 with 0 refills and will follow-up for routine testing of Vitamin D, at least 2-3 times per year to avoid over-replacement.   At risk for osteoporosis. Juleah was given approximately 15 minutes of osteoporosis prevention counseling today. Teasha is at risk for osteopenia and osteoporosis due to her Vitamin D deficiency. She was encouraged to take her Vitamin D and follow her higher calcium diet and increase strengthening exercise to help strengthen her bones and decrease her risk of osteopenia and osteoporosis.  Repetitive spaced learning was employed today to elicit superior memory formation and behavioral change.  Class 2 severe obesity with serious comorbidity and body mass index (BMI) of 39.0 to 39.9 in adult, unspecified obesity type ([place]).  Chelsee is currently in the action stage of change. As such, her goal is to continue with weight loss efforts. She has agreed to the Category 3 Plan and will journal 350-500 calories and 35+ grams of protein at lunch and 450-600 calories and 40+ grams of protein at supper.   Exercise goals: Lakenya will continue her current exercise regimen.  Behavioral modification strategies: increasing lean protein intake, meal planning and cooking strategies, keeping healthy foods in the home, planning for success and keeping a strict food journal.  Maycie has agreed to follow-up with our clinic in 2 weeks. She was informed of the importance of frequent follow-up visits to maximize her success with intensive lifestyle modifications for her multiple  health conditions.   Objective:   Blood pressure 131/88, pulse 78, temperature 98.3 F (36.8 C), temperature source Oral, height 5\' 5"  (1.651 m), weight 239 lb (108.4 kg), SpO2 98 %. Body mass index is 39.77 kg/m.  General: Cooperative, alert,  well developed, in no acute distress. HEENT: Conjunctivae and lids unremarkable. Cardiovascular: Regular rhythm.  Lungs: Normal work of breathing. Neurologic: No focal deficits.   Lab Results  Component Value Date   CREATININE 0.67 07/03/2019   BUN 13 07/03/2019   NA 140 07/03/2019   K 4.5 07/03/2019   CL 105 07/03/2019   CO2 20 07/03/2019   Lab Results  Component Value Date   ALT 16 07/03/2019   AST 20 07/03/2019   ALKPHOS 84 07/03/2019   BILITOT 0.6 07/03/2019   Lab Results  Component Value Date   HGBA1C 5.7 (H) 07/03/2019   Lab Results  Component Value Date   INSULIN 16.7 07/03/2019   Lab Results  Component Value Date   TSH 1.050 07/03/2019   Lab Results  Component Value Date   CHOL 185 07/03/2019   HDL 64 07/03/2019   LDLCALC 105 (H) 07/03/2019   TRIG 91 07/03/2019   CHOLHDL 3.0 02/08/2019   Lab Results  Component Value Date   WBC 5.0 07/03/2019   HGB 13.3 07/03/2019   HCT 38.5 07/03/2019   MCV 88 07/03/2019   PLT 276 07/03/2019   No results found for: IRON, TIBC, FERRITIN  Attestation Statements:   Reviewed by clinician on day of visit: allergies, medications, problem list, medical history, surgical history, family history, social history, and previous encounter notes.  I, 07/05/2019, am acting as transcriptionist for Marianna Payment, MD   I have reviewed the above documentation for accuracy and completeness, and I agree with the above. - Reuben Likes, MD

## 2019-09-13 ENCOUNTER — Other Ambulatory Visit (INDEPENDENT_AMBULATORY_CARE_PROVIDER_SITE_OTHER): Payer: Self-pay | Admitting: Family Medicine

## 2019-09-13 DIAGNOSIS — R7303 Prediabetes: Secondary | ICD-10-CM

## 2019-09-27 ENCOUNTER — Ambulatory Visit (INDEPENDENT_AMBULATORY_CARE_PROVIDER_SITE_OTHER): Payer: 59 | Admitting: Family Medicine

## 2019-09-28 ENCOUNTER — Encounter (INDEPENDENT_AMBULATORY_CARE_PROVIDER_SITE_OTHER): Payer: Self-pay | Admitting: Family Medicine

## 2019-09-28 ENCOUNTER — Ambulatory Visit (INDEPENDENT_AMBULATORY_CARE_PROVIDER_SITE_OTHER): Payer: 59 | Admitting: Family Medicine

## 2019-09-28 ENCOUNTER — Other Ambulatory Visit: Payer: Self-pay

## 2019-09-28 VITALS — HR 75 | Temp 98.1°F | Ht 65.0 in | Wt 238.0 lb

## 2019-09-28 DIAGNOSIS — Z6839 Body mass index (BMI) 39.0-39.9, adult: Secondary | ICD-10-CM

## 2019-09-28 DIAGNOSIS — R7303 Prediabetes: Secondary | ICD-10-CM

## 2019-09-28 DIAGNOSIS — E559 Vitamin D deficiency, unspecified: Secondary | ICD-10-CM

## 2019-09-28 DIAGNOSIS — Z9189 Other specified personal risk factors, not elsewhere classified: Secondary | ICD-10-CM | POA: Diagnosis not present

## 2019-09-28 MED ORDER — VITAMIN D (ERGOCALCIFEROL) 1.25 MG (50000 UNIT) PO CAPS: 50000.0000 [IU] | ORAL_CAPSULE | ORAL | 0 refills | Status: DC

## 2019-10-02 NOTE — Progress Notes (Signed)
Chief Complaint:   Amber Parrish is here to discuss her progress with her obesity treatment plan along with follow-up of her obesity related diagnoses. Amber Parrish is on the Category 3 Plan and states she is following her eating plan approximately 75% of the time. Amber Parrish states she is doing resistance bands and squats 30 minutes 8-10 times in a 2 week period.  Today's visit was #: 7 Starting weight: 251 lbs Starting date: 07/03/2019 Today's weight: 238 lbs Today's date: 09/28/2019 Total lbs lost to date: 13 Total lbs lost since last in-office visit: 1  Interim History: Amber Parrish realizes that when she is following her normal routine and life is busy she reverts back to old eating habits. The last few days have been very relaxing and easier for her to stay on plan. She realized she was choosing to eat off plan and didn't eat all she was supposed to.  Subjective:   Vitamin D deficiency. No nausea, vomiting, or muscle weakness, but she endorses fatigue. Amber Parrish is on prescription Vitamin D. Last Vitamin D 28.0 on 07/03/2019.  Prediabetes. Amber Parrish has a diagnosis of prediabetes based on her elevated HgA1c and was informed this puts her at greater risk of developing diabetes. She continues to work on diet and exercise to decrease her risk of diabetes. She denies nausea or hypoglycemia. Amber Parrish is on no medications.  Lab Results  Component Value Date   HGBA1C 5.7 (H) 07/03/2019   Lab Results  Component Value Date   INSULIN 16.7 07/03/2019   At risk for osteoporosis. Amber Parrish is at higher risk of osteopenia and osteoporosis due to Vitamin D deficiency.   Assessment/Plan:   Vitamin D deficiency. Low Vitamin D level contributes to fatigue and are associated with obesity, breast, and colon cancer. She was given a refill on her Vitamin D, Ergocalciferol, (DRISDOL) 1.25 MG (50000 UNIT) CAPS capsule every week #4 with 0 refills and will follow-up for routine testing of Vitamin D, at  least 2-3 times per year to avoid over-replacement.   Prediabetes. Amber Parrish will continue to work on weight loss, exercise, and decreasing simple carbohydrates to help decrease the risk of diabetes. She will have repeat labs in 1 month.  At risk for osteoporosis. Amber Parrish was given approximately 15 minutes of osteoporosis prevention counseling today. Amber Parrish is at risk for osteopenia and osteoporosis due to her Vitamin D deficiency. She was encouraged to take her Vitamin D and follow her higher calcium diet and increase strengthening exercise to help strengthen her bones and decrease her risk of osteopenia and osteoporosis.  Repetitive spaced learning was employed today to elicit superior memory formation and behavioral change.  Class 2 severe obesity with serious comorbidity and body mass index (BMI) of 39.0 to 39.9 in adult, unspecified obesity type (Amber Parrish).  Amber Parrish is currently in the action stage of change. As such, her goal is to continue with weight loss efforts. She has agreed to the Category 3 Plan.   Exercise goals: For substantial health benefits, adults should do at least 150 minutes (2 hours and 30 minutes) a week of moderate-intensity, or 75 minutes (1 hour and 15 minutes) a week of vigorous-intensity aerobic physical activity, or an equivalent combination of moderate- and vigorous-intensity aerobic activity. Aerobic activity should be performed in episodes of at least 10 minutes, and preferably, it should be spread throughout the week.  Behavioral modification strategies: increasing lean protein intake, meal planning and cooking strategies and keeping healthy foods in the home.  Amber Parrish  has agreed to follow-up with our clinic in 2 weeks. She was informed of the importance of frequent follow-up visits to maximize her success with intensive lifestyle modifications for her multiple health conditions.   Objective:   Pulse 75, temperature 98.1 F (36.7 C), temperature source Oral, height  5\' 5"  (1.651 m), weight 238 lb (108 kg), SpO2 99 %. Body mass index is 39.61 kg/m.  General: Cooperative, alert, well developed, in no acute distress. HEENT: Conjunctivae and lids unremarkable. Cardiovascular: Regular rhythm.  Lungs: Normal work of breathing. Neurologic: No focal deficits.   Lab Results  Component Value Date   CREATININE 0.67 07/03/2019   BUN 13 07/03/2019   NA 140 07/03/2019   K 4.5 07/03/2019   CL 105 07/03/2019   CO2 20 07/03/2019   Lab Results  Component Value Date   ALT 16 07/03/2019   AST 20 07/03/2019   ALKPHOS 84 07/03/2019   BILITOT 0.6 07/03/2019   Lab Results  Component Value Date   HGBA1C 5.7 (H) 07/03/2019   Lab Results  Component Value Date   INSULIN 16.7 07/03/2019   Lab Results  Component Value Date   TSH 1.050 07/03/2019   Lab Results  Component Value Date   CHOL 185 07/03/2019   HDL 64 07/03/2019   LDLCALC 105 (H) 07/03/2019   TRIG 91 07/03/2019   CHOLHDL 3.0 02/08/2019   Lab Results  Component Value Date   WBC 5.0 07/03/2019   HGB 13.3 07/03/2019   HCT 38.5 07/03/2019   MCV 88 07/03/2019   PLT 276 07/03/2019   No results found for: IRON, TIBC, FERRITIN  Attestation Statements:   Reviewed by clinician on day of visit: allergies, medications, problem list, medical history, surgical history, family history, social history, and previous encounter notes.  I, 07/05/2019, am acting as transcriptionist for Marianna Payment, MD   I have reviewed the above documentation for accuracy and completeness, and I agree with the above. - Reuben Likes, MD

## 2019-10-12 ENCOUNTER — Ambulatory Visit (INDEPENDENT_AMBULATORY_CARE_PROVIDER_SITE_OTHER): Payer: 59 | Admitting: Adult Health

## 2019-10-12 ENCOUNTER — Other Ambulatory Visit: Payer: Self-pay

## 2019-10-12 ENCOUNTER — Encounter (INDEPENDENT_AMBULATORY_CARE_PROVIDER_SITE_OTHER): Payer: Self-pay | Admitting: Adult Health

## 2019-10-12 VITALS — BP 125/81 | HR 71 | Temp 97.9°F | Ht 65.0 in | Wt 238.0 lb

## 2019-10-12 DIAGNOSIS — Z6839 Body mass index (BMI) 39.0-39.9, adult: Secondary | ICD-10-CM

## 2019-10-12 DIAGNOSIS — E559 Vitamin D deficiency, unspecified: Secondary | ICD-10-CM | POA: Diagnosis not present

## 2019-10-12 DIAGNOSIS — R7303 Prediabetes: Secondary | ICD-10-CM | POA: Diagnosis not present

## 2019-10-12 NOTE — Progress Notes (Signed)
Chief Complaint:   [place] is here to discuss her progress with her obesity treatment plan along with follow-up of her obesity related diagnoses. Amber Parrish is on the Category 3 Plan and states she is following her eating plan approximately 75% of the time. Jimmy states she is doing resistance bands/walking 30 minutes 3-4 times per week.  Today's visit was #: 8 Starting weight: 251 lbs Starting date: 07/03/2019 Today's weight: 238 lbs Today's date: 10/12/2019 Total lbs lost to date: 13 Total lbs lost since last in-office visit: 0  Interim History: Amber Parrish's oldest child graduated from high school and will be moving to [facility]) this weekend. She has been challenged to eat on plan due to a hectic schedule. The family will be traveling to [facility] to help move him into school and she is interested in travel/celebration eating strategies.  Subjective:   Vitamin D deficiency. [name] is on prescription Vitamin D supplementation.    Ref. Range 07/03/2019 11:56  Vitamin D, 25-Hydroxy Latest Ref Range: 30.0 - 100.0 ng/mL 28.0 (L)   Prediabetes. Amber Parrish has a diagnosis of prediabetes based on her elevated HgA1c and was informed this puts her at greater risk of developing diabetes. She continues to work on diet and exercise to decrease her risk of diabetes. She denies nausea or hypoglycemia. Amber Parrish is not on metformin. She denies polyphagia.  Lab Results  Component Value Date   HGBA1C 5.7 (H) 07/03/2019   Lab Results  Component Value Date   INSULIN 16.7 07/03/2019   Assessment/Plan:   Vitamin D deficiency. Low Vitamin D level contributes to fatigue and are associated with obesity, breast, and colon cancer. She agrees to continue to take prescription Vitamin D as directed (no refill needed today) and will follow-up for routine testing of Vitamin D every 3 months.  Prediabetes. [name] will continue to work on weight loss, exercise, and decreasing  simple carbohydrates to help decrease the risk of diabetes. She will continue the Category 3 meal plan and will have labs checked every 3 months.  Class 2 severe obesity with serious comorbidity and body mass index (BMI) of 39.0 to 39.9 in adult, unspecified obesity type ([place]).  Amber Parrish is currently in the action stage of change. As such, her goal is to continue with weight loss efforts. She has agreed to the Category 3 Plan.   Exercise goals: Amber Parrish will continue her current exercise regimen.   Behavioral modification strategies: increasing lean protein intake, meal planning and cooking strategies, travel eating strategies, celebration eating strategies and planning for success.  Kemper has agreed to follow-up with our clinic in 2 weeks. She was informed of the importance of frequent follow-up visits to maximize her success with intensive lifestyle modifications for her multiple health conditions.   Objective:   Blood pressure 125/81, pulse 71, temperature 97.9 F (36.6 C), temperature source Oral, height 5\' 5"  (1.651 m), weight 238 lb (108 kg), SpO2 100 %. Body mass index is 39.61 kg/m.  General: Cooperative, alert, well developed, in no acute distress. HEENT: Conjunctivae and lids unremarkable. Cardiovascular: Regular rhythm.  Lungs: Normal work of breathing. Neurologic: No focal deficits.   Lab Results  Component Value Date   CREATININE 0.67 07/03/2019   BUN 13 07/03/2019   NA 140 07/03/2019   K 4.5 07/03/2019   CL 105 07/03/2019   CO2 20 07/03/2019   Lab Results  Component Value Date   ALT 16 07/03/2019   AST 20 07/03/2019  ALKPHOS 84 07/03/2019   BILITOT 0.6 07/03/2019   Lab Results  Component Value Date   HGBA1C 5.7 (H) 07/03/2019   Lab Results  Component Value Date   INSULIN 16.7 07/03/2019   Lab Results  Component Value Date   TSH 1.050 07/03/2019   Lab Results  Component Value Date   CHOL 185 07/03/2019   HDL 64 07/03/2019   LDLCALC 105 (H)  07/03/2019   TRIG 91 07/03/2019   CHOLHDL 3.0 02/08/2019   Lab Results  Component Value Date   WBC 5.0 07/03/2019   HGB 13.3 07/03/2019   HCT 38.5 07/03/2019   MCV 88 07/03/2019   PLT 276 07/03/2019   No results found for: IRON, TIBC, FERRITIN  Attestation Statements:   Reviewed by clinician on day of visit: allergies, medications, problem list, medical history, surgical history, family history, social history, and previous encounter notes.  Time spent on visit including pre-visit chart review and post-visit charting and care was 27 minutes.   I, Marianna Payment, am acting as Energy manager for [employer], NP-C   I have reviewed the above documentation for accuracy and completeness, and I agree with the above. -  [name] Fusi, NP

## 2019-10-26 ENCOUNTER — Encounter (INDEPENDENT_AMBULATORY_CARE_PROVIDER_SITE_OTHER): Payer: Self-pay | Admitting: Adult Health

## 2019-10-26 ENCOUNTER — Ambulatory Visit (INDEPENDENT_AMBULATORY_CARE_PROVIDER_SITE_OTHER): Payer: 59 | Admitting: Adult Health

## 2019-10-26 ENCOUNTER — Other Ambulatory Visit: Payer: Self-pay

## 2019-10-26 VITALS — BP 126/65 | HR 80 | Temp 98.0°F | Ht 65.0 in | Wt 235.0 lb

## 2019-10-26 DIAGNOSIS — R7303 Prediabetes: Secondary | ICD-10-CM | POA: Diagnosis not present

## 2019-10-26 DIAGNOSIS — Z9189 Other specified personal risk factors, not elsewhere classified: Secondary | ICD-10-CM

## 2019-10-26 DIAGNOSIS — E559 Vitamin D deficiency, unspecified: Secondary | ICD-10-CM | POA: Diagnosis not present

## 2019-10-26 DIAGNOSIS — Z6839 Body mass index (BMI) 39.0-39.9, adult: Secondary | ICD-10-CM

## 2019-10-26 MED ORDER — VITAMIN D (ERGOCALCIFEROL) 1.25 MG (50000 UNIT) PO CAPS: 50000.0000 [IU] | ORAL_CAPSULE | ORAL | 0 refills | Status: DC

## 2019-10-26 NOTE — Progress Notes (Signed)
Chief Complaint:   OBESITY Amber Parrish is here to discuss her progress with her obesity treatment plan along with follow-up of her obesity related diagnoses. [name] is on the Category 3 Plan and states she is following her eating plan approximately 75-80% of the time. [name] states she is doing cardio 45 minutes 3 times per week.  Today's visit was #: 9 Starting weight: 251 lbs Starting date: 07/03/2019 Today's weight: 235 lbs Today's date: 10/26/2019 Total lbs lost to date: 16 Total lbs lost since last in-office visit: 3  Interim History: Amber Parrish recently moved her son into his dorm at [employer] and it went well! She celebrated Independence Day at a cookout and stayed on track by preparing healthy dishes that she shared at the gathering. She has been using My Fitness Pal for nutrition information of various foods and tracks calories and protein when she eats meals off plan.  Subjective:   Vitamin D deficiency. [name] is on prescription strength Vitamin D supplementation and is tolerating it well. No nausea, vomiting, or muscle weakness.    Ref. Range 07/03/2019 11:56  Vitamin D, 25-Hydroxy Latest Ref Range: 30.0 - 100.0 ng/mL 28.0 (L)   Prediabetes. Amber Parrish has a diagnosis of prediabetes based on her elevated HgA1c and was informed this puts her at greater risk of developing diabetes. She continues to work on diet and exercise to decrease her risk of diabetes. She denies nausea or hypoglycemia. [name] is not on metformin and denies polyphagia. She does have a family history of type II diabetes.  Lab Results  Component Value Date   HGBA1C 5.7 (H) 07/03/2019   Lab Results  Component Value Date   INSULIN 16.7 07/03/2019   At risk for diabetes mellitus. Amber Parrish is at higher than average risk for developing diabetes due to prediabetes, obesity, and family history of diabetes.  Assessment/Plan:   Vitamin D deficiency. Low Vitamin D level contributes to fatigue and are  associated with obesity, breast, and colon cancer. She was given a refill on her Vitamin D, Ergocalciferol, (DRISDOL) 1.25 MG (50000 UNIT) CAPS capsule every week #4 with 0 refills and will follow-up for routine testing of Vitamin D at her next office visit.   Prediabetes. Amber Parrish will continue to work on weight loss, exercise, and decreasing simple carbohydrates to help decrease the risk of diabetes. She will continue the Category 3 meal plan and will have labs checked at her next office visit.  At risk for diabetes mellitus. Amber Parrish was given approximately 15 minutes of diabetes education and counseling today. We discussed intensive lifestyle modifications today with an emphasis on weight loss as well as increasing exercise and decreasing simple carbohydrates in her diet. We also reviewed medication options with an emphasis on risk versus benefit of those discussed.   Repetitive spaced learning was employed today to elicit superior memory formation and behavioral change.  Class 2 severe obesity with serious comorbidity and body mass index (BMI) of 39.0 to 39.9 in adult, unspecified obesity type (HCC).  Amber Parrish is currently in the action stage of change. As such, her goal is to continue with weight loss efforts. She has agreed to the Category 3 Plan.   We provided calorie range and protein goal for meals on the Category 3 meal plan.  Exercise goals: Amber Parrish will continue her current exercise regimen.   Behavioral modification strategies: increasing lean protein intake, meal planning and cooking strategies, better snacking choices, travel eating strategies and planning for success.  Amber Parrish has  agreed to follow-up with our clinic in 2 weeks. She was informed of the importance of frequent follow-up visits to maximize her success with intensive lifestyle modifications for her multiple health conditions.   Objective:   Blood pressure 126/65, pulse 80, temperature 98 F (36.7 C), temperature  source Oral, height 5\' 5"  (1.651 m), weight 235 lb (106.6 kg), SpO2 99 %. Body mass index is 39.11 kg/m.  General: Cooperative, alert, well developed, in no acute distress. HEENT: Conjunctivae and lids unremarkable. Cardiovascular: Regular rhythm.  Lungs: Normal work of breathing. Neurologic: No focal deficits.   Lab Results  Component Value Date   CREATININE 0.67 07/03/2019   BUN 13 07/03/2019   NA 140 07/03/2019   K 4.5 07/03/2019   CL 105 07/03/2019   CO2 20 07/03/2019   Lab Results  Component Value Date   ALT 16 07/03/2019   AST 20 07/03/2019   ALKPHOS 84 07/03/2019   BILITOT 0.6 07/03/2019   Lab Results  Component Value Date   HGBA1C 5.7 (H) 07/03/2019   Lab Results  Component Value Date   INSULIN 16.7 07/03/2019   Lab Results  Component Value Date   TSH 1.050 07/03/2019   Lab Results  Component Value Date   CHOL 185 07/03/2019   HDL 64 07/03/2019   LDLCALC 105 (H) 07/03/2019   TRIG 91 07/03/2019   CHOLHDL 3.0 02/08/2019   Lab Results  Component Value Date   WBC 5.0 07/03/2019   HGB 13.3 07/03/2019   HCT 38.5 07/03/2019   MCV 88 07/03/2019   PLT 276 07/03/2019   No results found for: IRON, TIBC, FERRITIN  Attestation Statements:   Reviewed by clinician on day of visit: allergies, medications, problem list, medical history, surgical history, family history, social history, and previous encounter notes.  I, 07/05/2019, am acting as Marianna Payment for Energy manager, NP-C   I have reviewed the above documentation for accuracy and completeness, and I agree with the above. -  [employer], NP

## 2019-11-15 ENCOUNTER — Other Ambulatory Visit: Payer: Self-pay

## 2019-11-15 ENCOUNTER — Encounter (INDEPENDENT_AMBULATORY_CARE_PROVIDER_SITE_OTHER): Payer: Self-pay | Admitting: Family Medicine

## 2019-11-15 ENCOUNTER — Ambulatory Visit (INDEPENDENT_AMBULATORY_CARE_PROVIDER_SITE_OTHER): Payer: 59 | Admitting: Family Medicine

## 2019-11-15 VITALS — BP 110/72 | HR 77 | Temp 97.9°F | Ht 65.0 in | Wt 234.0 lb

## 2019-11-15 DIAGNOSIS — E7849 Other hyperlipidemia: Secondary | ICD-10-CM

## 2019-11-15 DIAGNOSIS — E559 Vitamin D deficiency, unspecified: Secondary | ICD-10-CM | POA: Diagnosis not present

## 2019-11-15 DIAGNOSIS — R7303 Prediabetes: Secondary | ICD-10-CM | POA: Diagnosis not present

## 2019-11-15 DIAGNOSIS — Z9189 Other specified personal risk factors, not elsewhere classified: Secondary | ICD-10-CM | POA: Diagnosis not present

## 2019-11-15 DIAGNOSIS — Z6838 Body mass index (BMI) 38.0-38.9, adult: Secondary | ICD-10-CM

## 2019-11-15 NOTE — Progress Notes (Signed)
Chief Complaint:   OBESITY Amber Parrish is here to discuss her progress with her obesity treatment plan along with follow-up of her obesity related diagnoses. Amber Parrish is on the Category 3 Plan and states she is following her eating plan approximately 85% of the time. Amber Parrish states she is working out at [employer] for 60 minutes 5 times per week.  Today's visit was #: 10 Starting weight: 251 lbs Starting date: 07/03/2019 Today's weight: 234 lbs Today's date: 11/16/2019 Total lbs lost to date: 17 lbs Total lbs lost since last in-office visit: 1 lb  Interim History: Amber Parrish says that it is easy to follow the plan on weekdays.  She packs her lunch and stays on plan.  On weekends, she skips lunch or eats a little off plan.  She eats out for dinner most weekends.  She says she makes healthy choices when out, but she is unsure of the calories/ingredients.  She has had no labs since they were first checked with [place] on 07/03/2019.  At the gym, she says she has been doing a little light jogging with her speed walking.  She will be traveling to [place] this weekend to pick up her son.  Subjective:   1. Prediabetes Amber Parrish has a diagnosis of prediabetes based on her elevated HgA1c and was informed this puts her at greater risk of developing diabetes. She continues to work on diet and exercise to decrease her risk of diabetes. She denies nausea or hypoglycemia.  Lab Results  Component Value Date   HGBA1C 5.7 (H) 11/15/2019   Lab Results  Component Value Date   INSULIN 17.5 11/15/2019   INSULIN 16.7 07/03/2019   2. Vitamin D deficiency Amber Parrish's Vitamin D level was 28.0 on 07/03/2019. She is currently taking prescription vitamin D 50,000 IU each week. She denies nausea, vomiting or muscle weakness.  3. Other hyperlipidemia Amber Parrish has hyperlipidemia and has been trying to improve her cholesterol levels with intensive lifestyle modification including a low saturated fat diet, exercise and  weight loss. She denies any chest pain, claudication or myalgias.  Lab Results  Component Value Date   ALT 16 07/03/2019   AST 20 07/03/2019   ALKPHOS 84 07/03/2019   BILITOT 0.6 07/03/2019   Lab Results  Component Value Date   CHOL 181 11/15/2019   HDL 53 11/15/2019   LDLCALC 109 (H) 11/15/2019   TRIG 107 11/15/2019   CHOLHDL 3.4 11/15/2019   4. At risk for heart disease Amber Parrish is at a higher than average risk for cardiovascular disease due to obesity, hyperlipidemia, and prediabetes.   Assessment/Plan:   1. Prediabetes Discussed labs with patient today.  Amber Parrish will continue to work on weight loss, exercise, and decreasing simple carbohydrates to help decrease the risk of diabetes.  Will check fasting insulin and A1c today. - Hemoglobin A1c - Insulin, random  2. Vitamin D deficiency Discussed labs with patient today.  Low Vitamin D level contributes to fatigue and are associated with obesity, breast, and colon cancer. She agrees to continue to take prescription Vitamin D @50 ,000 IU every week and will follow-up for routine testing of Vitamin D, at least 2-3 times per year to avoid over-replacement.  Will check vitamin D level today. - VITAMIN D 25 Hydroxy (Vit-D Deficiency, Fractures)  3. Other hyperlipidemia Discussed labs with patient today.  Cardiovascular risk and specific lipid/LDL goals reviewed.  We discussed several lifestyle modifications today and Amber Parrish will continue to work on diet, exercise and weight loss  efforts. Orders and follow up as documented in patient record.  Will check FLP today.  She is fasting.  Counseling Intensive lifestyle modifications are the first line treatment for this issue. . Dietary changes: Increase soluble fiber. Decrease simple carbohydrates. . Exercise changes: Moderate to vigorous-intensity aerobic activity 150 minutes per week if tolerated. . Lipid-lowering medications: see documented in medical record. - Lipid panel  4. At risk  for heart disease Amber Parrish was given approximately 15 minutes of coronary artery disease prevention counseling today. She is 49 y.o. female and has risk factors for heart disease including obesity. We discussed intensive lifestyle modifications today with an emphasis on specific weight loss instructions and strategies.   Repetitive spaced learning was employed today to elicit superior memory formation and behavioral change.  5. Class 2 severe obesity with serious comorbidity and body mass index (BMI) of 38.0 to 38.9 in adult, unspecified obesity type (HCC) Amber Parrish is currently in the action stage of change. As such, her goal is to continue with weight loss efforts. She has agreed to the Category 3 Plan.   Exercise goals: As is.  Behavioral modification strategies: increasing lean protein intake, increasing water intake, decreasing eating out, no skipping meals, keeping healthy foods in the home and planning for success.  Amber Parrish has agreed to follow-up with our clinic in 2 weeks. She was informed of the importance of frequent follow-up visits to maximize her success with intensive lifestyle modifications for her multiple health conditions.   Amber Parrish was informed we would discuss her lab results at her next visit unless there is a critical issue that needs to be addressed sooner. Amber Parrish agreed to keep her next visit at the agreed upon time to discuss these results.  Objective:   Blood pressure 110/72, pulse 77, temperature 97.9 F (36.6 C), height 5\' 5"  (1.651 m), weight (!) 234 lb (106.1 kg), SpO2 97 %. Body mass index is 38.94 kg/m.  General: Cooperative, alert, well developed, in no acute distress. HEENT: Conjunctivae and lids unremarkable. Cardiovascular: Regular rhythm.  Lungs: Normal work of breathing. Neurologic: No focal deficits.   Lab Results  Component Value Date   CREATININE 0.67 07/03/2019   BUN 13 07/03/2019   NA 140 07/03/2019   K 4.5 07/03/2019   CL 105 07/03/2019    CO2 20 07/03/2019   Lab Results  Component Value Date   ALT 16 07/03/2019   AST 20 07/03/2019   ALKPHOS 84 07/03/2019   BILITOT 0.6 07/03/2019   Lab Results  Component Value Date   HGBA1C 5.7 (H) 11/15/2019   HGBA1C 5.7 (H) 07/03/2019   Lab Results  Component Value Date   INSULIN 17.5 11/15/2019   INSULIN 16.7 07/03/2019   Lab Results  Component Value Date   TSH 1.050 07/03/2019   Lab Results  Component Value Date   CHOL 181 11/15/2019   HDL 53 11/15/2019   LDLCALC 109 (H) 11/15/2019   TRIG 107 11/15/2019   CHOLHDL 3.4 11/15/2019   Lab Results  Component Value Date   WBC 5.0 07/03/2019   HGB 13.3 07/03/2019   HCT 38.5 07/03/2019   MCV 88 07/03/2019   PLT 276 07/03/2019   Attestation Statements:   Reviewed by clinician on day of visit: allergies, medications, problem list, medical history, surgical history, family history, social history, and previous encounter notes.  I, 07/05/2019, CMA, am acting as Insurance claims handler for Energy manager, DO.  I have reviewed the above documentation for accuracy and completeness, and I agree with  the above. Thomasene Lot, DO

## 2019-11-16 LAB — LIPID PANEL
Chol/HDL Ratio: 3.4 ratio (ref 0.0–4.4)
Cholesterol, Total: 181 mg/dL (ref 100–199)
HDL: 53 mg/dL (ref >39)
LDL Chol Calc (NIH): 109 mg/dL — ABNORMAL HIGH (ref 0–99)
Triglycerides: 107 mg/dL (ref 0–149)
VLDL Cholesterol Cal: 19 mg/dL (ref 5–40)

## 2019-11-16 LAB — VITAMIN D 25 HYDROXY (VIT D DEFICIENCY, FRACTURES): Vit D, 25-Hydroxy: 39.1 ng/mL (ref 30.0–100.0)

## 2019-11-16 LAB — HEMOGLOBIN A1C
Est. average glucose Bld gHb Est-mCnc: 117 mg/dL
Hgb A1c MFr Bld: 5.7 % — ABNORMAL HIGH (ref 4.8–5.6)

## 2019-11-16 LAB — INSULIN, RANDOM: INSULIN: 17.5 u[IU]/mL (ref 2.6–24.9)

## 2019-11-19 ENCOUNTER — Other Ambulatory Visit (INDEPENDENT_AMBULATORY_CARE_PROVIDER_SITE_OTHER): Payer: Self-pay | Admitting: Adult Health

## 2019-11-19 DIAGNOSIS — E559 Vitamin D deficiency, unspecified: Secondary | ICD-10-CM

## 2019-11-29 ENCOUNTER — Other Ambulatory Visit: Payer: Self-pay

## 2019-11-29 ENCOUNTER — Ambulatory Visit (INDEPENDENT_AMBULATORY_CARE_PROVIDER_SITE_OTHER): Payer: 59 | Admitting: Family Medicine

## 2019-11-29 ENCOUNTER — Encounter (INDEPENDENT_AMBULATORY_CARE_PROVIDER_SITE_OTHER): Payer: Self-pay | Admitting: Family Medicine

## 2019-11-29 VITALS — BP 128/77 | HR 73 | Temp 98.0°F | Ht 65.0 in | Wt 237.0 lb

## 2019-11-29 DIAGNOSIS — Z9189 Other specified personal risk factors, not elsewhere classified: Secondary | ICD-10-CM

## 2019-11-29 DIAGNOSIS — E559 Vitamin D deficiency, unspecified: Secondary | ICD-10-CM | POA: Diagnosis not present

## 2019-11-29 DIAGNOSIS — E8881 Metabolic syndrome: Secondary | ICD-10-CM

## 2019-11-29 DIAGNOSIS — Z6839 Body mass index (BMI) 39.0-39.9, adult: Secondary | ICD-10-CM

## 2019-11-29 MED ORDER — VITAMIN D (ERGOCALCIFEROL) 1.25 MG (50000 UNIT) PO CAPS: 50000.0000 [IU] | ORAL_CAPSULE | ORAL | 0 refills | Status: DC

## 2019-11-29 NOTE — Progress Notes (Signed)
Chief Complaint:   OBESITY Amber Parrish is here to discuss her progress with her obesity treatment plan along with follow-up of her obesity related diagnoses. Amber Parrish is on the Category 3 Plan and states she is following her eating plan approximately 60% of the time. Amber Parrish states she is going to the gym for 60 minutes 2 times per week.  Today's visit was #: 11 Starting weight: 251 lbs Starting date: 07/03/2019 Today's weight: 237 lbs Today's date: 12/04/2019 Total lbs lost to date: 14 lbs Total lbs lost since last in-office visit: 0  Interim History: Amber Parrish says that her 47 year old father was recently in the hospital.  She has a lot of life stressors.  She has skipped meals.  He is now home and is better.  She says that life is getting back to normal for her.  She says that dinner is the most difficult to follow for her and she would like options.  Subjective:   1. Insulin resistance Amber Parrish has a diagnosis of insulin resistance based on her elevated fasting insulin level >5, which we reviewed labs today in person and all questions were answered.  -  She continues to work on diet and exercise to decrease her risk of diabetes.  She was tried on metformin in the past and had GI symptoms and could not tolerate.  Lab Results  Component Value Date   INSULIN 17.5 11/15/2019   INSULIN 16.7 07/03/2019   Lab Results  Component Value Date   HGBA1C 5.7 (H) 11/15/2019   2. Vitamin D deficiency Amber Parrish's Vitamin D level was 39.1 on 11/15/2019. She is currently taking prescription vitamin D 50,000 IU each week. She denies nausea, vomiting or muscle weakness.  Tolerating well with no side effects or issues.  3. At risk for diabetes mellitus Amber Parrish is at higher than average risk for developing diabetes due to her obesity.   Assessment/Plan:   1. Insulin resistance Amber Parrish will continue to work on weight loss, exercise, and decreasing simple carbohydrates to help decrease the risk of  diabetes. Amber Parrish agreed to follow-up with [place] as directed to closely monitor her progress.  Continue prudent nutritional plan, weight loss.  Decrease simple carbs.  2. Vitamin D deficiency Low Vitamin D level contributes to fatigue and are associated with obesity, breast, and colon cancer. She agrees to continue to take prescription Vitamin D @50 ,000 IU every week and will follow-up for routine testing of Vitamin D, at least 2-3 times per year to avoid over-replacement.  Continue prudent nutritional plan and weight loss. - Vitamin D, Ergocalciferol, (DRISDOL) 1.25 MG (50000 UNIT) CAPS capsule; Take 1 capsule (50,000 Units total) by mouth every 7 (seven) days.  Dispense: 4 capsule; Refill: 0  3. At risk for diabetes mellitus Amber Parrish was given approximately 15 minutes of diabetes education and counseling today. We discussed intensive lifestyle modifications today with an emphasis on weight loss as well as increasing exercise and decreasing simple carbohydrates in her diet. We also reviewed medication options with an emphasis on risk versus benefit of those discussed.   Repetitive spaced learning was employed today to elicit superior memory formation and behavioral change.  4. Class 2 severe obesity with serious comorbidity and body mass index (BMI) of 39.0 to 39.9 in adult, unspecified obesity type (HCC) Amber Parrish is currently in the action stage of change. As such, her goal is to continue with weight loss efforts. She has agreed to the Category 3 Plan +350-450 calories and 40+ grams of protein at  dinner.   Exercise goals: As is.  Behavioral modification strategies: increasing lean protein intake, decreasing simple carbohydrates, no skipping meals, meal planning and cooking strategies, emotional eating strategies and planning for success.  Amber Parrish has agreed to follow-up with our clinic in 2 weeks. She was informed of the importance of frequent follow-up visits to maximize her success with intensive  lifestyle modifications for her multiple health conditions.   Objective:   Blood pressure 128/77, pulse 73, temperature 98 F (36.7 C), height 5\' 5"  (1.651 m), weight 237 lb (107.5 kg), SpO2 97 %. Body mass index is 39.44 kg/m.  General: Cooperative, alert, well developed, in no acute distress. HEENT: Conjunctivae and lids unremarkable. Cardiovascular: Regular rhythm.  Lungs: Normal work of breathing. Neurologic: No focal deficits.   Lab Results  Component Value Date   CREATININE 0.67 07/03/2019   BUN 13 07/03/2019   NA 140 07/03/2019   K 4.5 07/03/2019   CL 105 07/03/2019   CO2 20 07/03/2019   Lab Results  Component Value Date   ALT 16 07/03/2019   AST 20 07/03/2019   ALKPHOS 84 07/03/2019   BILITOT 0.6 07/03/2019   Lab Results  Component Value Date   HGBA1C 5.7 (H) 11/15/2019   HGBA1C 5.7 (H) 07/03/2019   Lab Results  Component Value Date   INSULIN 17.5 11/15/2019   INSULIN 16.7 07/03/2019   Lab Results  Component Value Date   TSH 1.050 07/03/2019   Lab Results  Component Value Date   CHOL 181 11/15/2019   HDL 53 11/15/2019   LDLCALC 109 (H) 11/15/2019   TRIG 107 11/15/2019   CHOLHDL 3.4 11/15/2019   Lab Results  Component Value Date   WBC 5.0 07/03/2019   HGB 13.3 07/03/2019   HCT 38.5 07/03/2019   MCV 88 07/03/2019   PLT 276 07/03/2019   Attestation Statements:   Reviewed by clinician on day of visit: allergies, medications, problem list, medical history, surgical history, family history, social history, and previous encounter notes.  I, 07/05/2019, CMA, am acting as Insurance claims handler for Energy manager, DO.  I have reviewed the above documentation for accuracy and completeness, and I agree with the above. [employer], DO

## 2019-12-13 ENCOUNTER — Ambulatory Visit (INDEPENDENT_AMBULATORY_CARE_PROVIDER_SITE_OTHER): Payer: 59 | Admitting: Family Medicine

## 2019-12-13 ENCOUNTER — Other Ambulatory Visit: Payer: Self-pay

## 2019-12-13 ENCOUNTER — Encounter (INDEPENDENT_AMBULATORY_CARE_PROVIDER_SITE_OTHER): Payer: Self-pay | Admitting: Family Medicine

## 2019-12-13 VITALS — BP 122/82 | HR 70 | Temp 98.4°F | Ht 65.0 in | Wt 234.0 lb

## 2019-12-13 DIAGNOSIS — E8881 Metabolic syndrome: Secondary | ICD-10-CM

## 2019-12-13 DIAGNOSIS — E559 Vitamin D deficiency, unspecified: Secondary | ICD-10-CM | POA: Diagnosis not present

## 2019-12-13 DIAGNOSIS — Z6839 Body mass index (BMI) 39.0-39.9, adult: Secondary | ICD-10-CM

## 2019-12-13 MED ORDER — VITAMIN D (ERGOCALCIFEROL) 1.25 MG (50000 UNIT) PO CAPS: 50000.0000 [IU] | ORAL_CAPSULE | ORAL | 0 refills | Status: DC

## 2019-12-14 NOTE — Progress Notes (Signed)
Chief Complaint:   OBESITY Thania is here to discuss her progress with her obesity treatment plan along with follow-up of her obesity related diagnoses. Malicia is on the Category 3 Plan and states she is following her eating plan approximately 80% of the time. Rolla states she is playing volleyball and going to the gym for 60 minutes 2-5 times per week.  Today's visit was #: 12 Starting weight: 251 lbs Starting date: 07/03/2019 Today's weight: 234 lbs Today's date: 07/13/2019 Total lbs lost to date: 17 lbs Total lbs lost since last in-office visit: 3 lbs  Interim History: Khylie played volleyball for 2.5 hours and felt great.  She says she did have a night out for dinner.  She ate grilled chicken and vegetables.  She went to a bar one time and drank 1 light beer.  Thus, she is making good/healthier choices when eating out.  No problems with the plan or concerns with hunger or cravings.  Subjective:   1. Vitamin D deficiency Gretta's Vitamin D level was 39.1 on 11/15/2019. She is currently taking prescription vitamin D 50,000 IU each week. She denies nausea, vomiting or muscle weakness.  2. Insulin resistance Lanette has a diagnosis of insulin resistance based on her elevated fasting insulin level >5. She continues to work on diet and exercise to decrease her risk of diabetes.  Lab Results  Component Value Date   INSULIN 17.5 11/15/2019   INSULIN 16.7 07/03/2019   Lab Results  Component Value Date   HGBA1C 5.7 (H) 11/15/2019   Assessment/Plan:   1. Vitamin D deficiency Low Vitamin D level contributes to fatigue and are associated with obesity, breast, and colon cancer. She agrees to continue to take prescription Vitamin D @50 ,000 IU every week and will follow-up for routine testing of Vitamin D, at least 2-3 times per year to avoid over-replacement.  -Refill Vitamin D, Ergocalciferol, (DRISDOL) 1.25 MG (50000 UNIT) CAPS capsule; Take 1 capsule (50,000 Units total) by  mouth every 7 (seven) days.  Dispense: 4 capsule; Refill: 0  2. Insulin resistance Shaquel will continue to work on weight loss, exercise, and decreasing simple carbohydrates to help decrease the risk of diabetes. Kalaya agreed to follow-up with Shanda Bumps as directed to closely monitor her progress.  3. Class 2 severe obesity with serious comorbidity and body mass index (BMI) of 39.0 to 39.9 in adult, unspecified obesity type (HCC) Sunni is currently in the action stage of change. As such, her goal is to continue with weight loss efforts. She has agreed to the Category 3 Plan.   Exercise goals: As is.  Behavioral modification strategies: increasing lean protein intake, decreasing simple carbohydrates, meal planning and cooking strategies and planning for success.  Anyia has agreed to follow-up with our clinic in 2 weeks. She was informed of the importance of frequent follow-up visits to maximize her success with intensive lifestyle modifications for her multiple health conditions.   Objective:   Blood pressure 122/82, pulse 70, temperature 98.4 F (36.9 C), height 5\' 5"  (1.651 m), weight 234 lb (106.1 kg), SpO2 98 %. Body mass index is 38.94 kg/m.  General: Cooperative, alert, well developed, in no acute distress. HEENT: Conjunctivae and lids unremarkable. Cardiovascular: Regular rhythm.  Lungs: Normal work of breathing. Neurologic: No focal deficits.   Lab Results  Component Value Date   CREATININE 0.67 07/03/2019   BUN 13 07/03/2019   NA 140 07/03/2019   K 4.5 07/03/2019   CL 105 07/03/2019   CO2  20 07/03/2019   Lab Results  Component Value Date   ALT 16 07/03/2019   AST 20 07/03/2019   ALKPHOS 84 07/03/2019   BILITOT 0.6 07/03/2019   Lab Results  Component Value Date   HGBA1C 5.7 (H) 11/15/2019   HGBA1C 5.7 (H) 07/03/2019   Lab Results  Component Value Date   INSULIN 17.5 11/15/2019   INSULIN 16.7 07/03/2019   Lab Results  Component Value Date   TSH 1.050  07/03/2019   Lab Results  Component Value Date   CHOL 181 11/15/2019   HDL 53 11/15/2019   LDLCALC 109 (H) 11/15/2019   TRIG 107 11/15/2019   CHOLHDL 3.4 11/15/2019   Lab Results  Component Value Date   WBC 5.0 07/03/2019   HGB 13.3 07/03/2019   HCT 38.5 07/03/2019   MCV 88 07/03/2019   PLT 276 07/03/2019   Attestation Statements:   Reviewed by clinician on day of visit: allergies, medications, problem list, medical history, surgical history, family history, social history, and previous encounter notes.  I, Insurance claims handler, CMA, am acting as Energy manager for [employer], DO.  I have reviewed the above documentation for accuracy and completeness, and I agree with the above. Thomasene Lot, DO

## 2019-12-26 ENCOUNTER — Other Ambulatory Visit: Payer: Self-pay

## 2019-12-26 ENCOUNTER — Ambulatory Visit (INDEPENDENT_AMBULATORY_CARE_PROVIDER_SITE_OTHER): Payer: 59 | Admitting: Family Medicine

## 2019-12-26 ENCOUNTER — Encounter (INDEPENDENT_AMBULATORY_CARE_PROVIDER_SITE_OTHER): Payer: Self-pay | Admitting: Family Medicine

## 2019-12-26 VITALS — BP 129/70 | HR 78 | Temp 98.2°F | Ht 65.0 in | Wt 235.0 lb

## 2019-12-26 DIAGNOSIS — R7303 Prediabetes: Secondary | ICD-10-CM | POA: Diagnosis not present

## 2019-12-26 DIAGNOSIS — E559 Vitamin D deficiency, unspecified: Secondary | ICD-10-CM | POA: Diagnosis not present

## 2019-12-26 DIAGNOSIS — Z9189 Other specified personal risk factors, not elsewhere classified: Secondary | ICD-10-CM | POA: Diagnosis not present

## 2019-12-26 DIAGNOSIS — Z6839 Body mass index (BMI) 39.0-39.9, adult: Secondary | ICD-10-CM | POA: Diagnosis not present

## 2019-12-26 MED ORDER — VITAMIN D (ERGOCALCIFEROL) 1.25 MG (50000 UNIT) PO CAPS: 50000.0000 [IU] | ORAL_CAPSULE | ORAL | 0 refills | Status: DC

## 2019-12-26 NOTE — Progress Notes (Signed)
Chief Complaint:   OBESITY Amber Parrish is here to discuss her progress with her obesity treatment plan along with follow-up of her obesity related diagnoses. Amber Parrish is on the Category 3 Plan and states she is following her eating plan approximately 80% of the time. Amber Parrish states she is exercising for 0 minutes 0 times per week.  Today's visit was #: 13 Starting weight: 251 lbs Starting date: 07/03/2019 Today's weight: 235 lbs Today's date: 12/26/2019 Total lbs lost to date: 16 lbs Total lbs lost since last in-office visit: 0  Interim History: Amber Parrish had an ACL repair to her left knee yrs ago and recently aggravated it playing volleyball last week.   Her first surgery was in 1994 and again in 1999 with Dr. Fara Chute,- he had to redo the ACL repair.   She has been unable to exercise, which she feels is key to helping her lose weight.  Denies issues with the meal plan.  No concerns or need for change.  She says that her hunger and cravings are well controlled.   Subjective:   1. Prediabetes Amber Parrish has a diagnosis of prediabetes based on her elevated HgA1c and was informed this puts her at greater risk of developing diabetes. She continues to work on diet and exercise to decrease her risk of diabetes. She denies nausea or hypoglycemia.  Lab Results  Component Value Date   HGBA1C 5.7 (H) 11/15/2019   Lab Results  Component Value Date   INSULIN 17.5 11/15/2019   INSULIN 16.7 07/03/2019   2. Vitamin D deficiency Amber Parrish's Vitamin D level was 39.1 on 11/15/2019. She is currently taking prescription vitamin D 50,000 IU each week. She denies nausea, vomiting or muscle weakness.  Denies issues with medication.  Taking weekly as prescribed without concern.  3. At risk for dehydration Amber Parrish is at increased risk for dehydration.  See plan for more information.  Assessment/Plan:   1. Prediabetes - I reiterated and again counseled patient on pathophysiology of the disease process of  Pre-DM.   Stressed importance of dietary and lifestyle modifications resulting in weight loss as first line, in addition to discussing the risks and benefits of metformin and various other medication options which can help [place] in the management of this disease process.   - Pt agreed to treatment plan today which was conceived using shared medical decision making.     - Handouts provided at pt's request after education provided and all concerns/questions addressed.    - Anticipatory guidance given.   - Recheck A1c and fasting insulin level in approximately 3 months from last check or as deemed fit.   2. Vitamin D deficiency Low Vitamin D level contributes to fatigue and are associated with obesity, breast, and colon cancer. She agrees to continue to take prescription Vitamin D @50 ,000 IU every week and will follow-up for routine testing of Vitamin D, at least 2-3 times per year to avoid over-replacement.   - Recheck levels at the end of October or so.  Vitamin D is not at goal of 50+.  -Refill Vitamin D, Ergocalciferol, (DRISDOL) 1.25 MG (50000 UNIT) CAPS capsule; Take 1 capsule (50,000 Units total) by mouth every 7 (seven) days.  Dispense: 4 capsule; Refill: 0  3. At risk for dehydration Amber Parrish is at higher than average risk of dehydration.  Amber Parrish was given proper hydration counseling today.   We discussed the signs and symptoms of dehydration, some of which may include muscle cramping, constipation or even orthostatic symptoms.  Counseling on the prevention of dehydration was also provided today.    Amber Parrish is at risk for dehydration due to weight loss, lifestyle and behavorial habits and possibly due to taking certain medication(s).  She was encouraged to adequately hydrate and monitor fluid status to avoid dehydration as well as weight loss plateaus. I recommended roughly one half of their weight in pounds to be the approximate ounces of non-caloric, non-caffeinated beverages they should drink  per day; including more if they are engaging in exercise.  4. Class 2 severe obesity with serious comorbidity and body mass index (BMI) of 39.0 to 39.9 in adult, unspecified obesity type (HCC) Amber Parrish is currently in the action stage of change. As such, her goal is to continue with weight loss efforts. She has agreed to the Category 3 Plan.  No change as she likes the plan and finds it works well for her and is easy to follow.  Exercise goals: All adults should avoid inactivity. Some physical activity is better than none, and adults who participate in any amount of physical activity gain some health benefits.  Behavioral modification strategies: increasing water intake, celebration eating strategies and planning for success.   Amber Parrish has agreed to follow-up with our clinic in 2 weeks. She was informed of the importance of frequent follow-up visits to maximize her success with intensive lifestyle modifications for her multiple health conditions.   Objective:   Blood pressure 129/70, pulse 78, temperature 98.2 F (36.8 C), height 5\' 5"  (1.651 m), weight 235 lb (106.6 kg), SpO2 99 %. Body mass index is 39.11 kg/m.  General: Cooperative, alert, well developed, in no acute distress. HEENT: Conjunctivae and lids unremarkable. Cardiovascular: Regular rhythm.  Lungs: Normal work of breathing. Neurologic: No focal deficits.   Lab Results  Component Value Date   CREATININE 0.67 07/03/2019   BUN 13 07/03/2019   NA 140 07/03/2019   K 4.5 07/03/2019   CL 105 07/03/2019   CO2 20 07/03/2019   Lab Results  Component Value Date   ALT 16 07/03/2019   AST 20 07/03/2019   ALKPHOS 84 07/03/2019   BILITOT 0.6 07/03/2019   Lab Results  Component Value Date   HGBA1C 5.7 (H) 11/15/2019   HGBA1C 5.7 (H) 07/03/2019   Lab Results  Component Value Date   INSULIN 17.5 11/15/2019   INSULIN 16.7 07/03/2019   Lab Results  Component Value Date   TSH 1.050 07/03/2019   Lab Results  Component  Value Date   CHOL 181 11/15/2019   HDL 53 11/15/2019   LDLCALC 109 (H) 11/15/2019   TRIG 107 11/15/2019   CHOLHDL 3.4 11/15/2019   Lab Results  Component Value Date   WBC 5.0 07/03/2019   HGB 13.3 07/03/2019   HCT 38.5 07/03/2019   MCV 88 07/03/2019   PLT 276 07/03/2019   Attestation Statements:   Reviewed by clinician on day of visit: allergies, medications, problem list, medical history, surgical history, family history, social history, and previous encounter notes.  I, 07/05/2019, CMA, am acting as Insurance claims handler for Energy manager, DO.  I have reviewed the above documentation for accuracy and completeness, and I agree with the above. [employer], DO

## 2020-01-09 ENCOUNTER — Ambulatory Visit (INDEPENDENT_AMBULATORY_CARE_PROVIDER_SITE_OTHER): Payer: 59 | Admitting: Family Medicine

## 2020-01-09 ENCOUNTER — Encounter (INDEPENDENT_AMBULATORY_CARE_PROVIDER_SITE_OTHER): Payer: Self-pay | Admitting: Family Medicine

## 2020-01-09 ENCOUNTER — Other Ambulatory Visit: Payer: Self-pay

## 2020-01-09 VITALS — BP 128/53 | HR 77 | Temp 98.5°F | Ht 65.0 in | Wt 233.0 lb

## 2020-01-09 DIAGNOSIS — Z9189 Other specified personal risk factors, not elsewhere classified: Secondary | ICD-10-CM

## 2020-01-09 DIAGNOSIS — E559 Vitamin D deficiency, unspecified: Secondary | ICD-10-CM | POA: Diagnosis not present

## 2020-01-09 DIAGNOSIS — Z6838 Body mass index (BMI) 38.0-38.9, adult: Secondary | ICD-10-CM

## 2020-01-09 MED ORDER — VITAMIN D (ERGOCALCIFEROL) 1.25 MG (50000 UNIT) PO CAPS: 50000.0000 [IU] | ORAL_CAPSULE | ORAL | 0 refills | Status: DC

## 2020-01-11 NOTE — Progress Notes (Signed)
Chief Complaint:   OBESITY Amber Parrish is here to discuss her progress with her obesity treatment plan along with follow-up of her obesity related diagnoses. Amber Parrish is on the Category 3 Plan and states she is following her eating plan approximately 80% of the time. Amber Parrish states she is walking for 30 minutes 3 times per week.  Today's visit was #: 14 Starting weight: 251 lbs Starting date: 07/03/2019 Today's weight: 233 lbs Today's date: 01/09/2020 Total lbs lost to date: 18 lbs Total lbs lost since last in-office visit: 2 lbs  Interim History: Amber Parrish is still dealing with left knee stiffness and pain, but it has decreased.  She traveled to see her son in [place] Amber Parrish recently and tailgated.  She says she made healthy alternatives for herself.  Subjective:   1. Vitamin D deficiency Amber Parrish's Vitamin D level was 39.1 on 11/15/2019. She is currently taking prescription vitamin D 50,000 IU each week. She denies nausea, vomiting or muscle weakness.  2. At risk for activity intolerance Amber Parrish is at risk for activity intolerance due to knee pain.  Assessment/Plan:   1. Vitamin D deficiency Low Vitamin D level contributes to fatigue and are associated with obesity, breast, and colon cancer. She agrees to continue to take prescription Vitamin D @50 ,000 IU every week and will follow-up for routine testing of Vitamin D, at least 2-3 times per year to avoid over-replacement.  -Refill Vitamin D, Ergocalciferol, (DRISDOL) 1.25 MG (50000 UNIT) CAPS capsule; Take 1 capsule (50,000 Units total) by mouth every 7 (seven) days.  Dispense: 4 capsule; Refill: 0  2. At risk for activity intolerance Amber Parrish was given approximately 15 minutes of exercise intolerance counseling today. She is 49 y.o. female and has risk factors exercise intolerance including obesity. We discussed intensive lifestyle modifications today with an emphasis on specific weight loss instructions and strategies. Amber Parrish will slowly  increase activity as tolerated.  3. Class 2 severe obesity with serious comorbidity and body mass index (BMI) of 38.0 to 38.9 in adult, unspecified obesity type (HCC) Amber Parrish is currently in the action stage of change. As such, her goal is to continue with weight loss efforts. She has agreed to the Category 3 Plan.   Exercise goals: As is plus add some biking for 30 minutes 2 days per week.  Behavioral modification strategies: increasing lean protein intake, no skipping meals, meal planning and cooking strategies, celebration eating strategies and planning for success.  Amber Parrish has agreed to follow-up with our clinic in 2 weeks. She was informed of the importance of frequent follow-up visits to maximize her success with intensive lifestyle modifications for her multiple health conditions.   Objective:   Blood pressure (!) 128/53, pulse 77, temperature 98.5 F (36.9 C), height 5\' 5"  (1.651 m), weight 233 lb (105.7 kg), SpO2 97 %. Body mass index is 38.77 kg/m.  General: Cooperative, alert, well developed, in no acute distress. HEENT: Conjunctivae and lids unremarkable. Cardiovascular: Regular rhythm.  Lungs: Normal work of breathing. Neurologic: No focal deficits.   Lab Results  Component Value Date   CREATININE 0.67 07/03/2019   BUN 13 07/03/2019   NA 140 07/03/2019   K 4.5 07/03/2019   CL 105 07/03/2019   CO2 20 07/03/2019   Lab Results  Component Value Date   ALT 16 07/03/2019   AST 20 07/03/2019   ALKPHOS 84 07/03/2019   BILITOT 0.6 07/03/2019   Lab Results  Component Value Date   HGBA1C 5.7 (H) 11/15/2019   HGBA1C  5.7 (H) 07/03/2019   Lab Results  Component Value Date   INSULIN 17.5 11/15/2019   INSULIN 16.7 07/03/2019   Lab Results  Component Value Date   TSH 1.050 07/03/2019   Lab Results  Component Value Date   CHOL 181 11/15/2019   HDL 53 11/15/2019   LDLCALC 109 (H) 11/15/2019   TRIG 107 11/15/2019   CHOLHDL 3.4 11/15/2019   Lab Results    Component Value Date   WBC 5.0 07/03/2019   HGB 13.3 07/03/2019   HCT 38.5 07/03/2019   MCV 88 07/03/2019   PLT 276 07/03/2019   Attestation Statements:   Reviewed by clinician on day of visit: allergies, medications, problem list, medical history, surgical history, family history, social history, and previous encounter notes.  I, Insurance claims handler, CMA, am acting as Energy manager for [employer], DO.  I have reviewed the above documentation for accuracy and completeness, and I agree with the above. -  Thomasene Lot, DO

## 2020-01-25 ENCOUNTER — Ambulatory Visit (INDEPENDENT_AMBULATORY_CARE_PROVIDER_SITE_OTHER): Payer: 59 | Admitting: Family Medicine

## 2020-01-25 ENCOUNTER — Encounter (INDEPENDENT_AMBULATORY_CARE_PROVIDER_SITE_OTHER): Payer: Self-pay | Admitting: Family Medicine

## 2020-01-25 ENCOUNTER — Other Ambulatory Visit: Payer: Self-pay

## 2020-01-25 VITALS — BP 107/67 | HR 68 | Temp 98.7°F | Ht 65.0 in | Wt 231.0 lb

## 2020-01-25 DIAGNOSIS — Z6838 Body mass index (BMI) 38.0-38.9, adult: Secondary | ICD-10-CM | POA: Diagnosis not present

## 2020-01-25 DIAGNOSIS — E559 Vitamin D deficiency, unspecified: Secondary | ICD-10-CM

## 2020-01-29 NOTE — Progress Notes (Signed)
Chief Complaint:   OBESITY Amber Parrish is here to discuss her progress with her obesity treatment plan along with follow-up of her obesity related diagnoses. Amber Parrish is on the Category 3 Plan and states she is following her eating plan approximately 85-90% of the time. Amber Parrish states she is walking and bike riding for 20-30 minutes 4 times per week.  Today's visit was #: 15 Starting weight: 251 lbs Starting date: 07/03/2019 Today's weight: 232 lbs Today's date: 01/25/2020 Total lbs lost to date: 19 lbs Total lbs lost since last in-office visit: 1 lb   Interim History:  Amber Parrish says she is going on vacation over the next 4-5 days.  She is going to the [place]ach.  She will be cooking for herself - chicken and steak.  He is having 2 cups of vegetables and hitting her 8 ounce protein goal at night.  Everyday, she has a frozen meal.  She has increased her exercise from prior and says she is sleeping better and has a little more energy.   Assessment/Plan:   1. Vitamin D deficiency Current vitamin D is 39.1, tested on 11/15/2019. Not at goal. Optimal goal > 50 ng/dL. There is also evidence to support a goal of >70 ng/dL in patients with cancer and heart disease.   Plan: Continue Vitamin D @50 ,000 IU every week with follow-up for routine testing of Vitamin D at least 2-3 times per year to avoid over-replacement.  No orders of the defined types were placed in this encounter.  2. Class 2 severe obesity with serious comorbidity and body mass index (BMI) of 38.0 to 38.9 in adult, unspecified obesity type (HCC)  Amber Parrish is currently in the action stage of change. As such, her goal is to continue with weight loss efforts. She has agreed to the Category 3 Plan.   Exercise goals: Increase water intake to 5 bottles per day and continue with exercise goal of 20-30 minutes 4 days per week.  Behavioral modification strategies: increasing lean protein intake, increasing water intake, keeping healthy foods in  the home and planning for success.  Amber Parrish has agreed to follow-up with our clinic in 2 weeks. She was informed of the importance of frequent follow-up visits to maximize her success with intensive lifestyle modifications for her multiple health conditions.   Objective:   Blood pressure 107/67, pulse 68, temperature 98.7 F (37.1 C), height 5\' 5"  (1.651 m), weight 231 lb (104.8 kg), SpO2 98 %. Body mass index is 38.44 kg/m.  General: Cooperative, alert, well developed, in no acute distress. HEENT: Conjunctivae and lids unremarkable. Cardiovascular: Regular rhythm.  Lungs: Normal work of breathing. Neurologic: No focal deficits.   Lab Results  Component Value Date   CREATININE 0.67 07/03/2019   BUN 13 07/03/2019   NA 140 07/03/2019   K 4.5 07/03/2019   CL 105 07/03/2019   CO2 20 07/03/2019   Lab Results  Component Value Date   ALT 16 07/03/2019   AST 20 07/03/2019   ALKPHOS 84 07/03/2019   BILITOT 0.6 07/03/2019   Lab Results  Component Value Date   HGBA1C 5.7 (H) 11/15/2019   HGBA1C 5.7 (H) 07/03/2019   Lab Results  Component Value Date   INSULIN 17.5 11/15/2019   INSULIN 16.7 07/03/2019   Lab Results  Component Value Date   TSH 1.050 07/03/2019   Lab Results  Component Value Date   CHOL 181 11/15/2019   HDL 53 11/15/2019   LDLCALC 109 (H) 11/15/2019   TRIG  107 11/15/2019   CHOLHDL 3.4 11/15/2019   Lab Results  Component Value Date   WBC 5.0 07/03/2019   HGB 13.3 07/03/2019   HCT 38.5 07/03/2019   MCV 88 07/03/2019   PLT 276 07/03/2019   Attestation Statements:   Reviewed by clinician on day of visit: allergies, medications, problem list, medical history, surgical history, family history, social history, and previous encounter notes.  Time spent on visit including pre-visit chart review and post-visit care and charting was 20 minutes.   I, Insurance claims handler, CMA, am acting as Energy manager for [employer], DO.  I have reviewed the above  documentation for accuracy and completeness, and I agree with the above. Carlye Grippe, D.O.  The 21st Century Cures Act was signed into law in 2016 which includes the topic of electronic health records.  This provides immediate access to information in MyChart.  This includes consultation notes, operative notes, office notes, lab results and pathology reports.  If you have any questions about what you read please let us know at your next visit so we can discuss your concerns and take corrective action if need be.  We are right here with you.

## 2020-02-14 ENCOUNTER — Ambulatory Visit (INDEPENDENT_AMBULATORY_CARE_PROVIDER_SITE_OTHER): Payer: 59 | Admitting: Family Medicine

## 2020-02-15 ENCOUNTER — Encounter: Payer: 59 | Admitting: Family Medicine

## 2020-02-21 ENCOUNTER — Encounter (INDEPENDENT_AMBULATORY_CARE_PROVIDER_SITE_OTHER): Payer: Self-pay | Admitting: Family Medicine

## 2020-02-21 ENCOUNTER — Ambulatory Visit (INDEPENDENT_AMBULATORY_CARE_PROVIDER_SITE_OTHER): Payer: 59 | Admitting: Family Medicine

## 2020-02-21 ENCOUNTER — Other Ambulatory Visit: Payer: Self-pay

## 2020-02-21 VITALS — BP 124/74 | HR 71 | Temp 97.8°F | Ht 65.0 in | Wt 232.0 lb

## 2020-02-21 DIAGNOSIS — E559 Vitamin D deficiency, unspecified: Secondary | ICD-10-CM

## 2020-02-21 DIAGNOSIS — Z9189 Other specified personal risk factors, not elsewhere classified: Secondary | ICD-10-CM | POA: Diagnosis not present

## 2020-02-21 DIAGNOSIS — R7303 Prediabetes: Secondary | ICD-10-CM

## 2020-02-21 DIAGNOSIS — F3289 Other specified depressive episodes: Secondary | ICD-10-CM

## 2020-02-21 DIAGNOSIS — Z6838 Body mass index (BMI) 38.0-38.9, adult: Secondary | ICD-10-CM

## 2020-02-21 DIAGNOSIS — F32A Depression, unspecified: Secondary | ICD-10-CM | POA: Insufficient documentation

## 2020-02-21 MED ORDER — VITAMIN D (ERGOCALCIFEROL) 1.25 MG (50000 UNIT) PO CAPS: 50000.0000 [IU] | ORAL_CAPSULE | ORAL | 0 refills | Status: DC

## 2020-02-21 MED ORDER — BUPROPION HCL ER (SR) 100 MG PO TB12: 100.0000 mg | ORAL_TABLET | Freq: Every morning | ORAL | 0 refills | Status: DC

## 2020-02-21 NOTE — Progress Notes (Signed)
Chief Complaint:   OBESITY Amber Parrish is here to discuss her progress with her obesity treatment plan along with follow-up of her obesity related diagnoses. [name] is on the Category 3 Plan and states she is following her eating plan approximately 70% of the time. [name] states she is walking for 30 minutes 4 times per week.  Today's visit was #: 16 Starting weight: 251 lbs Starting date: 07/03/2019 Today's weight: 232 lbs Today's date: 02/26/2020 Total lbs lost to date: 19 lbs Total lbs lost since last in-office visit: 0 Total weight loss percentage to date: -7.57%  Interim History: [name] says she does not know what is going on, "I've been eating poorly lately and I'm not even hungry.  Not sure why" or what causes her to feel this way.  No increased stress.   It has been 1 month since last visit, although advised her 2 wks.    Eating candy and giving in to emotional/ mindless eating.  Plan:  Recommend Calm app "mindfulness eating series" and use it QD, also obtain intuitive eating workbook and start it QD, and will start Wellbutrin per her preference as well today.   Assessment/Plan:   1. Vitamin D deficiency Caroly's Vitamin D level was 39.1 on 11/15/2019. She is currently taking prescription vitamin D 50,000 IU each week. She denies nausea, vomiting or muscle weakness.  - Refill Vitamin D, Ergocalciferol, (DRISDOL) 1.25 MG (50000 UNIT) CAPS capsule; Take 1 capsule (50,000 Units total) by mouth every 7 (seven) days.  Dispense: 4 capsule; Refill: 0    2. Prediabetes Failed metformin in past.  Not keen on starting DM-type medication at this time due to bad GI side effects on previous one.  Plan:  Continue prudent nutritional plan, decrease simple carbs, increase proteins (follow meal plan), weight loss.  Lab Results  Component Value Date   HGBA1C 5.7 (H) 11/15/2019   Lab Results  Component Value Date   INSULIN 17.5 11/15/2019   INSULIN 16.7 07/03/2019     3. Other  depression, with emotional eating Denies depression or increased stress lately.  Feels "emotionally obsessed with foods" and cannot turn off her emotional eating.  Terrible urges started after 3 weeks on plan, she says.  Plan:  Start Wellbutrin 100 mg SR daily.  Discussed the risks/benefits of various  medications with her today. All Q's re: Wellbutrin addressed.  -Start buPROPion (WELLBUTRIN SR) 100 MG 12 hr tablet; Take 1 tablet (100 mg total) by mouth in the morning.  Dispense: 30 tablet; Refill: 0    4. At risk for impaired metabolic function Due to Mosetta's current state of health and medical condition(s), they are at a significantly higher risk for impaired metabolic function.  This further also puts the patient at much greater risk to also subsequently develop cardiopulmonary conditions that can negatively affect patient's quality of life as well.  At least 10 minutes was spent on counseling [name] about these concerns today and I stressed the importance of reversing these risks factors.  Initial goal is to lose at least 5-10% of starting weight to help reduce risk factors.  Counseling: Intensive lifestyle modifications discussed with [name] as most appropriate first line treatment.  she will continue to work on diet, exercise and weight loss efforts.  We will continue to reassess these conditions on a fairly regular basis in an attempt to decrease patient's overall morbidity and mortality.    5. Class 2 severe obesity with serious comorbidity and body mass index (BMI)  of 38.0 to 38.9 in adult, unspecified obesity type [facility])  [name] is currently in the action stage of change. As such, her goal is to continue with weight loss efforts. She has agreed to the Category 3 Plan.   Exercise goals: For substantial health benefits, adults should do at least 150 minutes (2 hours and 30 minutes) a week of moderate-intensity, or 75 minutes (1 hour and 15 minutes) a week of vigorous-intensity aerobic  physical activity, or an equivalent combination of moderate- and vigorous-intensity aerobic activity. Aerobic activity should be performed in episodes of at least 10 minutes, and preferably, it should be spread throughout the week.  Behavioral modification strategies: keeping healthy foods in the home, emotional eating strategies, avoiding temptations and planning for success.  [name] has agreed to follow-up with our clinic in 2 weeks. She was informed of the importance of frequent follow-up visits to maximize her success with intensive lifestyle modifications for her multiple health conditions.   Objective:   Blood pressure 124/74, pulse 71, temperature 97.8 F (36.6 C), height 5\' 5"  (1.651 m), weight 232 lb (105.2 kg), SpO2 95 %. Body mass index is 38.61 kg/m.  General: Cooperative, alert, well developed, in no acute distress. HEENT: Conjunctivae and lids unremarkable. Cardiovascular: Regular rhythm.  Lungs: Normal work of breathing. Neurologic: No focal deficits.   Lab Results  Component Value Date   CREATININE 0.67 07/03/2019   BUN 13 07/03/2019   NA 140 07/03/2019   K 4.5 07/03/2019   CL 105 07/03/2019   CO2 20 07/03/2019   Lab Results  Component Value Date   ALT 16 07/03/2019   AST 20 07/03/2019   ALKPHOS 84 07/03/2019   BILITOT 0.6 07/03/2019   Lab Results  Component Value Date   HGBA1C 5.7 (H) 11/15/2019   HGBA1C 5.7 (H) 07/03/2019   Lab Results  Component Value Date   INSULIN 17.5 11/15/2019   INSULIN 16.7 07/03/2019   Lab Results  Component Value Date   TSH 1.050 07/03/2019   Lab Results  Component Value Date   CHOL 181 11/15/2019   HDL 53 11/15/2019   LDLCALC 109 (H) 11/15/2019   TRIG 107 11/15/2019   CHOLHDL 3.4 11/15/2019   Lab Results  Component Value Date   WBC 5.0 07/03/2019   HGB 13.3 07/03/2019   HCT 38.5 07/03/2019   MCV 88 07/03/2019   PLT 276 07/03/2019   Attestation Statements:   Reviewed by clinician on day of visit:  allergies, medications, problem list, medical history, surgical history, family history, social history, and previous encounter notes.  I, 07/05/2019, CMA, am acting as Insurance claims handler for Energy manager, DO.  I have reviewed the above documentation for accuracy and completeness, and I agree with the above. -  *[employer], D.O.  The 21st Century Cures Act was signed into law in 2016 which includes the topic of electronic health records.  This provides immediate access to information in MyChart.  This includes consultation notes, operative notes, office notes, lab results and pathology reports.  If you have any questions about what you read please let 2017 know at your next visit so we can discuss your concerns and take corrective action if need be.  We are right here with you.

## 2020-03-05 ENCOUNTER — Other Ambulatory Visit: Payer: Self-pay

## 2020-03-05 ENCOUNTER — Ambulatory Visit (INDEPENDENT_AMBULATORY_CARE_PROVIDER_SITE_OTHER): Payer: 59 | Admitting: Family Medicine

## 2020-03-05 ENCOUNTER — Encounter (INDEPENDENT_AMBULATORY_CARE_PROVIDER_SITE_OTHER): Payer: Self-pay | Admitting: Family Medicine

## 2020-03-05 VITALS — BP 120/76 | HR 80 | Temp 97.9°F | Ht 65.0 in | Wt 231.0 lb

## 2020-03-05 DIAGNOSIS — Z6838 Body mass index (BMI) 38.0-38.9, adult: Secondary | ICD-10-CM

## 2020-03-05 DIAGNOSIS — R7303 Prediabetes: Secondary | ICD-10-CM

## 2020-03-05 DIAGNOSIS — F3289 Other specified depressive episodes: Secondary | ICD-10-CM | POA: Diagnosis not present

## 2020-03-05 DIAGNOSIS — Z9189 Other specified personal risk factors, not elsewhere classified: Secondary | ICD-10-CM

## 2020-03-05 MED ORDER — BUPROPION HCL ER (SR) 100 MG PO TB12: 100.0000 mg | ORAL_TABLET | Freq: Every morning | ORAL | 0 refills | Status: DC

## 2020-03-07 NOTE — Progress Notes (Signed)
Chief Complaint:   OBESITY Amber Parrish is here to discuss her progress with her obesity treatment plan along with follow-up of her obesity related diagnoses. Amber Parrish is on the Category 3 Plan and states she is following her eating plan approximately 80% of the time. [place] states she is walking 35 minutes 5 times per week.  Today's visit was #: 17 Starting weight: 251 lbs Starting date: 07/03/2019 Today's weight: 231 lbs Today's date: 03/05/2020 Total lbs lost to date: 20 lbs Total lbs lost since last in-office visit: 1 lb Total weight loss percentage to date: -7.97%  Interim History: Amber Parrish reports increased hunger before breakfast, but thinks she has increased stress at work and realizes emotional eating is a problem. She didn't obtain CALM app or Intuitive Eating Workbook. Denies other concerns.  Plan: [name] is to download CALM app and use it twice a day in the AM and PM. Also, pack meals- meal prep.   Assessment/Plan:    1. Pre-diabetes Amaka has a diagnosis of prediabetes based on her elevated HgA1c and was informed this puts her at greater risk of developing diabetes. She continues to work on diet and exercise to decrease her risk of diabetes. She denies nausea or hypoglycemia.  Plan: - I reiterated and again counseled patient on pathophysiology of the disease process of Pre-DM.     - Stressed importance of dietary and lifestyle modifications resulting in weight loss as first line.  - continue to decrease simple carbs; increase fiber and proteins  - Handouts provided at pt's request after education provided and all concerns/questions addressed.    - Anticipatory guidance given.  Recheck A1c and fasting insulin level in approximately 3 months from last check or as deemed fit.   - Consider GLP-1 in the future if patient amenable.   Lab Results  Component Value Date   HGBA1C 5.7 (H) 11/15/2019   Lab Results  Component Value Date   INSULIN 17.5 11/15/2019    INSULIN 16.7 07/03/2019    2. Other depression, with emotional eating Amber Parrish started Wellbutrin SR 100 mg every morning at her last office visit. She is sleeping well without issues due to medication, but she increased stress has caused her to awaken and "think".   Plan:  - Mood is well controlled currently.  No SI  - Counseled patient on pathophysiology of disease and discussed various treatment options, which often includes dietary and lifestyle modifications as first line, in addition to discussing the risks and benefits of various medications.   - Use free meditation apps and do "stress meditation" or "meditation for anxiety or depression" for minimum of 10 minutes, twice daily. - For guided meditation/ breathing exercises, advised patient to look into the Headspace, Calm, Breathe, or Ten Percent apps.  - In addition to possible prescription interventions, reviewed the "spokes of the wheel" of mood and health management.   - Stressed the importance of ongoing prudent health habits, including regular exercise, appropriate sleep hygiene, healthy dietary habits, prayer/ meditation to help with mood stability and seeking the help of a professional counselor discussed.    - Encouraged patient to work on simplifying life, setting better boundaries with others and minimizing sources of stress  - Anticipatory guidance given and extensive counseling and education provided to patient today.  All questions answered.  --  Refill Wellbutrin for 30 day supply at same dose. Also use CALM app and practice better self-care.  REFILL- buPROPion (WELLBUTRIN SR) 100 MG 12 hr tablet; Take 1  tablet (100 mg total) by mouth in the morning.  Dispense: 30 tablet; Refill: 0   3. At risk for impaired metabolic function Amber Parrish was given approximately 9 minutes of impaired  metabolic function prevention counseling today. We discussed intensive lifestyle modifications today with an emphasis on specific nutrition  and exercise instructions and strategies.   Repetitive spaced learning was employed today to elicit superior memory formation and behavioral change.   4. Class 2 severe obesity with serious comorbidity and body mass index (BMI) of 38.0 to 38.9 in adult, unspecified obesity type (HCC) Amber Parrish is currently in the action stage of change. As such, her goal is to continue with weight loss efforts. She has agreed to the Category 3 Plan.   Exercise goals: As is  Behavioral modification strategies: increasing lean protein intake, no skipping meals, meal planning and cooking strategies, holiday eating strategies  and planning for success.  Handout provided: Holiday Eating Strategies  Amber Parrish has agreed to follow-up with our clinic in 2-3 weeks. She was informed of the importance of frequent follow-up visits to maximize her success with intensive lifestyle modifications for her multiple health conditions.    Objective:   Blood pressure 120/76, pulse 80, temperature 97.9 F (36.6 C), height 5\' 5"  (1.651 m), weight 231 lb (104.8 kg), SpO2 99 %. Body mass index is 38.44 kg/m.  General: Cooperative, alert, well developed, in no acute distress. HEENT: Conjunctivae and lids unremarkable. Cardiovascular: Regular rhythm.  Lungs: Normal work of breathing. Neurologic: No focal deficits.   Lab Results  Component Value Date   CREATININE 0.67 07/03/2019   BUN 13 07/03/2019   NA 140 07/03/2019   K 4.5 07/03/2019   CL 105 07/03/2019   CO2 20 07/03/2019   Lab Results  Component Value Date   ALT 16 07/03/2019   AST 20 07/03/2019   ALKPHOS 84 07/03/2019   BILITOT 0.6 07/03/2019   Lab Results  Component Value Date   HGBA1C 5.7 (H) 11/15/2019   HGBA1C 5.7 (H) 07/03/2019   Lab Results  Component Value Date   INSULIN 17.5 11/15/2019   INSULIN 16.7 07/03/2019   Lab Results  Component Value Date   TSH 1.050 07/03/2019   Lab Results  Component Value Date   CHOL 181 11/15/2019   HDL 53  11/15/2019   LDLCALC 109 (H) 11/15/2019   TRIG 107 11/15/2019   CHOLHDL 3.4 11/15/2019   Lab Results  Component Value Date   WBC 5.0 07/03/2019   HGB 13.3 07/03/2019   HCT 38.5 07/03/2019   MCV 88 07/03/2019   PLT 276 07/03/2019    Attestation Statements:   Reviewed by clinician on day of visit: allergies, medications, problem list, medical history, surgical history, family history, social history, and previous encounter notes.  07/05/2019, am acting as Edmund Hilda for Energy manager, DO.  I have reviewed the above documentation for accuracy and completeness, and I agree with the above. [employer], D.O.  The 21st Century Cures Act was signed into law in 2016 which includes the topic of electronic health records.  This provides immediate access to information in MyChart.  This includes consultation notes, operative notes, office notes, lab results and pathology reports.  If you have any questions about what you read please let 2017 know at your next visit so we can discuss your concerns and take corrective action if need be.  We are right here with you.

## 2020-03-25 ENCOUNTER — Other Ambulatory Visit: Payer: Self-pay

## 2020-03-25 ENCOUNTER — Encounter (INDEPENDENT_AMBULATORY_CARE_PROVIDER_SITE_OTHER): Payer: Self-pay | Admitting: Family Medicine

## 2020-03-25 ENCOUNTER — Ambulatory Visit (INDEPENDENT_AMBULATORY_CARE_PROVIDER_SITE_OTHER): Payer: 59 | Admitting: Family Medicine

## 2020-03-25 VITALS — BP 120/77 | HR 67 | Temp 97.9°F | Ht 65.0 in | Wt 232.0 lb

## 2020-03-25 DIAGNOSIS — Z9189 Other specified personal risk factors, not elsewhere classified: Secondary | ICD-10-CM | POA: Insufficient documentation

## 2020-03-25 DIAGNOSIS — E559 Vitamin D deficiency, unspecified: Secondary | ICD-10-CM

## 2020-03-25 DIAGNOSIS — F3289 Other specified depressive episodes: Secondary | ICD-10-CM

## 2020-03-25 DIAGNOSIS — Z6838 Body mass index (BMI) 38.0-38.9, adult: Secondary | ICD-10-CM | POA: Diagnosis not present

## 2020-03-25 MED ORDER — VITAMIN D (ERGOCALCIFEROL) 1.25 MG (50000 UNIT) PO CAPS: 50000.0000 [IU] | ORAL_CAPSULE | ORAL | 0 refills | Status: DC

## 2020-03-25 MED ORDER — BUPROPION HCL ER (SR) 100 MG PO TB12: 100.0000 mg | ORAL_TABLET | Freq: Two times a day (BID) | ORAL | 0 refills | Status: DC

## 2020-03-25 NOTE — Progress Notes (Signed)
Chief Complaint:   OBESITY Amber Parrish is here to discuss her progress with her obesity treatment plan along with follow-up of her obesity related diagnoses. Amber Parrish is on the Category 3 Plan and states she is following her eating plan approximately 50% of the time. Amber Parrish states she is walking 35-40 minutes 3 times per week.  Today's visit was #: 18 Starting weight: 251 lbs Starting date: 07/03/2019 Today's weight: 232 lbs Today's date: 03/31/2020 Total lbs lost to date: 19 lbs Total lbs lost since last in-office visit: +1 lb Total weight loss percentage to date: -7.57%  Interim History: Amber Parrish reports that she traveled to her in-laws over the weekend. She feels like she is spending a lot of time worrying about what she can't eat versus what she can. A lot of traveling made meal planning very difficult.  Plan: Increase Wellbutrin to twice a day from once a day. We will consider adding topiramate in the future if increasing Wellbutrin is not effective as we desired. Handout give: Emotional eating.  Assessment/Plan:   1. Vitamin D deficiency Amber Parrish's Vitamin D level was 39.1 on 11/15/2019. She is currently taking prescription vitamin D 50,000 IU each week. She denies nausea, vomiting or muscle weakness.  Plan: Refill Vit D for 1 month, as per below. Low Vitamin D level contributes to fatigue and are associated with obesity, breast, and colon cancer. She agrees to continue to take prescription Vitamin D @50 ,000 IU every week and will follow-up for routine testing of Vitamin D, at least 2-3 times per year to avoid over-replacement.  Refill- Vitamin D, Ergocalciferol, (DRISDOL) 1.25 MG (50000 UNIT) CAPS capsule; Take 1 capsule (50,000 Units total) by mouth every 7 (seven) days.  Dispense: 4 capsule; Refill: 0  2. Other depression, with emotional eating Amber Parrish is struggling with emotional eating and using food for comfort to the extent that it is negatively impacting her health. She has  been working on behavior modification techniques to help reduce her emotional eating. She shows no sign of suicidal or homicidal ideations.  Plan: Increase Wellbutrin to twice a day. Behavior modification techniques were discussed today to help Amber Parrish deal with her emotional/non-hunger eating behaviors.  Orders and follow up as documented in patient record.   Refill with dose increase- buPROPion (WELLBUTRIN SR) 100 MG 12 hr tablet; Take 1 tablet (100 mg total) by mouth 2 (two) times daily.  Dispense: 60 tablet; Refill: 0  3. At risk for malnutrition Amber Parrish was given approximately 10+ minutes of counseling today regarding prevention of malnutrition and ways to meet macronutrient goals..   4. Class 2 severe obesity with serious comorbidity and body mass index (BMI) of 38.0 to 38.9 in adult, unspecified obesity type (HCC) Amber Parrish is currently in the action stage of change. As such, her goal is to continue with weight loss efforts. She has agreed to the Category 3 Plan.   Exercise goals: For substantial health benefits, adults should do at least 150 minutes (2 hours and 30 minutes) a week of moderate-intensity, or 75 minutes (1 hour and 15 minutes) a week of vigorous-intensity aerobic physical activity, or an equivalent combination of moderate- and vigorous-intensity aerobic activity. Aerobic activity should be performed in episodes of at least 10 minutes, and preferably, it should be spread throughout the week.  Behavioral modification strategies: increasing lean protein intake, decreasing simple carbohydrates, no skipping meals and planning for success.  Amber Parrish has agreed to follow-up with our clinic in 2 weeks. She was informed of the  importance of frequent follow-up visits to maximize her success with intensive lifestyle modifications for her multiple health conditions.   Objective:   Blood pressure 120/77, pulse 67, temperature 97.9 F (36.6 C), height 5\' 5"  (1.651 m), weight 232 lb (105.2  kg), SpO2 97 %. Body mass index is 38.61 kg/m.  General: Cooperative, alert, well developed, in no acute distress. HEENT: Conjunctivae and lids unremarkable. Cardiovascular: Regular rhythm.  Lungs: Normal work of breathing. Neurologic: No focal deficits.   Lab Results  Component Value Date   CREATININE 0.67 07/03/2019   BUN 13 07/03/2019   NA 140 07/03/2019   K 4.5 07/03/2019   CL 105 07/03/2019   CO2 20 07/03/2019   Lab Results  Component Value Date   ALT 16 07/03/2019   AST 20 07/03/2019   ALKPHOS 84 07/03/2019   BILITOT 0.6 07/03/2019   Lab Results  Component Value Date   HGBA1C 5.7 (H) 11/15/2019   HGBA1C 5.7 (H) 07/03/2019   Lab Results  Component Value Date   INSULIN 17.5 11/15/2019   INSULIN 16.7 07/03/2019   Lab Results  Component Value Date   TSH 1.050 07/03/2019   Lab Results  Component Value Date   CHOL 181 11/15/2019   HDL 53 11/15/2019   LDLCALC 109 (H) 11/15/2019   TRIG 107 11/15/2019   CHOLHDL 3.4 11/15/2019   Lab Results  Component Value Date   WBC 5.0 07/03/2019   HGB 13.3 07/03/2019   HCT 38.5 07/03/2019   MCV 88 07/03/2019   PLT 276 07/03/2019    Attestation Statements:   Reviewed by clinician on day of visit: allergies, medications, problem list, medical history, surgical history, family history, social history, and previous encounter notes.  07/05/2019, am acting as Edmund Hilda for Energy manager, DO.  I have reviewed the above documentation for accuracy and completeness, and I agree with the above. [employer], D.O.  The 21st Century Cures Act was signed into law in 2016 which includes the topic of electronic health records.  This provides immediate access to information in MyChart.  This includes consultation notes, operative notes, office notes, lab results and pathology reports.  If you have any questions about what you read please let 2017 know at your next visit so we can discuss your concerns and take  corrective action if need be.  We are right here with you.

## 2020-03-27 MED ORDER — VITAMIN D (ERGOCALCIFEROL) 1.25 MG (50000 UNIT) PO CAPS: 50000.0000 [IU] | ORAL_CAPSULE | ORAL | 0 refills | Status: DC

## 2020-03-27 MED ORDER — BUPROPION HCL ER (SR) 100 MG PO TB12: 100.0000 mg | ORAL_TABLET | Freq: Two times a day (BID) | ORAL | 0 refills | Status: DC

## 2020-04-09 ENCOUNTER — Ambulatory Visit (INDEPENDENT_AMBULATORY_CARE_PROVIDER_SITE_OTHER): Payer: 59 | Admitting: Family Medicine

## 2020-04-23 ENCOUNTER — Ambulatory Visit (INDEPENDENT_AMBULATORY_CARE_PROVIDER_SITE_OTHER): Payer: 59 | Admitting: Family Medicine

## 2020-04-23 ENCOUNTER — Encounter (INDEPENDENT_AMBULATORY_CARE_PROVIDER_SITE_OTHER): Payer: Self-pay | Admitting: Family Medicine

## 2020-04-23 ENCOUNTER — Other Ambulatory Visit: Payer: Self-pay

## 2020-04-23 VITALS — BP 127/77 | HR 82 | Temp 97.7°F | Ht 65.0 in | Wt 229.0 lb

## 2020-04-23 DIAGNOSIS — Z9189 Other specified personal risk factors, not elsewhere classified: Secondary | ICD-10-CM

## 2020-04-23 DIAGNOSIS — Z6838 Body mass index (BMI) 38.0-38.9, adult: Secondary | ICD-10-CM | POA: Diagnosis not present

## 2020-04-23 DIAGNOSIS — E559 Vitamin D deficiency, unspecified: Secondary | ICD-10-CM | POA: Diagnosis not present

## 2020-04-23 DIAGNOSIS — F39 Unspecified mood [affective] disorder: Secondary | ICD-10-CM | POA: Diagnosis not present

## 2020-04-23 MED ORDER — VITAMIN D (ERGOCALCIFEROL) 1.25 MG (50000 UNIT) PO CAPS: 50000.0000 [IU] | ORAL_CAPSULE | ORAL | 0 refills | Status: DC

## 2020-04-23 MED ORDER — BUPROPION HCL ER (SR) 100 MG PO TB12: 100.0000 mg | ORAL_TABLET | Freq: Two times a day (BID) | ORAL | 0 refills | Status: DC

## 2020-04-25 NOTE — Progress Notes (Signed)
Chief Complaint:   OBESITY Amber Parrish is here to discuss her progress with her obesity treatment plan along with follow-up of her obesity related diagnoses. Amber Parrish is on the Category 3 Plan and states she is following her eating plan approximately 70% of the time. Amber Parrish states she is walking 50 minutes 1-3 times per week.  Today's visit was #: 19 Starting weight: 251 lbs Starting date: 07/03/2019 Today's weight: 229 lbs Today's date: 04/23/2020 Total lbs lost to date: 22 lbs Total lbs lost since last in-office visit: 3 lbs Total weight loss percentage to date: -8.76%  Interim History: Amber Parrish did a lot of celebration eating between Christmas and New Year's, then got right back on it.  She has no issues or concerns with the plan.   Assessment/Plan:   1. Vitamin D deficiency [name]'s Vitamin D level was 39.1 on 11/15/2019. She is currently taking prescription vitamin D 50,000 IU each week. She denies nausea, vomiting or muscle weakness.   Ref. Range 11/15/2019 11:24  Vitamin D, 25-Hydroxy Latest Ref Range: 30.0 - 100.0 ng/mL 39.1   Plan: Refill Vit D for 1 month, as per below. Low Vitamin D level contributes to fatigue and are associated with obesity, breast, and colon cancer. She agrees to continue to take prescription Vitamin D @50 ,000 IU every week and will follow-up for routine testing of Vitamin D, at least 2-3 times per year to avoid over-replacement.  Refill- Vitamin D, Ergocalciferol, (DRISDOL) 1.25 MG (50000 UNIT) CAPS capsule; Take 1 capsule (50,000 Units total) by mouth every 7 (seven) days.  Dispense: 4 capsule; Refill: 0   2. Mood disorder (HCC), with emotional eating Roy is struggling with emotional eating and using food for comfort to the extent that it is negatively impacting her health. She has been working on behavior modification techniques to help reduce her emotional eating. She shows no sign of suicidal or homicidal ideations.  Plan:  - Refill Wellbutrin  for 1 month, as per below.    - I recommend for pt to Start counseling: [name] will contact her PCP about a referral for CBT to help with self awareness and motivations. Behavior modification techniques were discussed today to help Amber Parrish deal with her emotional/non-hunger eating behaviors.  Orders and follow up as documented in patient record.   Refill- buPROPion (WELLBUTRIN SR) 100 MG 12 hr tablet; Take 1 tablet (100 mg total) by mouth 2 (two) times daily.  Dispense: 60 tablet; Refill: 0    3. At risk for activity intolerance Amber Parrish was given approximately 15 minutes of exercise intolerance counseling today. She is 50 y.o. female and has risk factors exercise intolerance including obesity. We discussed intensive lifestyle modifications today with an emphasis on specific weight loss instructions and strategies. Harvest will slowly increase activity as tolerated.    4. Class 2 severe obesity with serious comorbidity and body mass index (BMI) of 38.0 to 38.9 in adult, unspecified obesity type (HCC) Amber Parrish is currently in the action stage of change. As such, her goal is to continue with weight loss efforts. She has agreed to the Category 3 Plan.   Exercise goals: 20 minutes 5 days a week  Behavioral modification strategies: meal planning and cooking strategies, keeping healthy foods in the home and planning for success.  Amber Parrish has agreed to follow-up with our clinic in 2-3 weeks. She was informed of the importance of frequent follow-up visits to maximize her success with intensive lifestyle modifications for her multiple health conditions.   Objective:  Blood pressure 127/77, pulse 82, temperature 97.7 F (36.5 C), height 5\' 5"  (1.651 m), weight 229 lb (103.9 kg), SpO2 98 %. Body mass index is 38.11 kg/m.  General: Cooperative, alert, well developed, in no acute distress. HEENT: Conjunctivae and lids unremarkable. Cardiovascular: Regular rhythm.  Lungs: Normal work of  breathing. Neurologic: No focal deficits.   Lab Results  Component Value Date   CREATININE 0.67 07/03/2019   BUN 13 07/03/2019   NA 140 07/03/2019   K 4.5 07/03/2019   CL 105 07/03/2019   CO2 20 07/03/2019   Lab Results  Component Value Date   ALT 16 07/03/2019   AST 20 07/03/2019   ALKPHOS 84 07/03/2019   BILITOT 0.6 07/03/2019   Lab Results  Component Value Date   HGBA1C 5.7 (H) 11/15/2019   HGBA1C 5.7 (H) 07/03/2019   Lab Results  Component Value Date   INSULIN 17.5 11/15/2019   INSULIN 16.7 07/03/2019   Lab Results  Component Value Date   TSH 1.050 07/03/2019   Lab Results  Component Value Date   CHOL 181 11/15/2019   HDL 53 11/15/2019   LDLCALC 109 (H) 11/15/2019   TRIG 107 11/15/2019   CHOLHDL 3.4 11/15/2019   Lab Results  Component Value Date   WBC 5.0 07/03/2019   HGB 13.3 07/03/2019   HCT 38.5 07/03/2019   MCV 88 07/03/2019   PLT 276 07/03/2019    Attestation Statements:   Reviewed by clinician on day of visit: allergies, medications, problem list, medical history, surgical history, family history, social history, and previous encounter notes.  07/05/2019, am acting as Edmund Hilda for Energy manager, DO.  I have reviewed the above documentation for accuracy and completeness, and I agree with the above. [employer], D.O.  The 21st Century Cures Act was signed into law in 2016 which includes the topic of electronic health records.  This provides immediate access to information in MyChart.  This includes consultation notes, operative notes, office notes, lab results and pathology reports.  If you have any questions about what you read please let 2017 know at your next visit so we can discuss your concerns and take corrective action if need be.  We are right here with you.

## 2020-05-06 ENCOUNTER — Encounter (INDEPENDENT_AMBULATORY_CARE_PROVIDER_SITE_OTHER): Payer: Self-pay

## 2020-05-07 ENCOUNTER — Encounter (INDEPENDENT_AMBULATORY_CARE_PROVIDER_SITE_OTHER): Payer: Self-pay | Admitting: Family Medicine

## 2020-05-07 ENCOUNTER — Telehealth (INDEPENDENT_AMBULATORY_CARE_PROVIDER_SITE_OTHER): Payer: 59 | Admitting: Family Medicine

## 2020-05-07 ENCOUNTER — Telehealth (INDEPENDENT_AMBULATORY_CARE_PROVIDER_SITE_OTHER): Payer: Self-pay

## 2020-05-07 ENCOUNTER — Other Ambulatory Visit: Payer: Self-pay

## 2020-05-07 VITALS — Wt 228.0 lb

## 2020-05-07 DIAGNOSIS — F39 Unspecified mood [affective] disorder: Secondary | ICD-10-CM

## 2020-05-07 DIAGNOSIS — E559 Vitamin D deficiency, unspecified: Secondary | ICD-10-CM | POA: Diagnosis not present

## 2020-05-07 DIAGNOSIS — Z9189 Other specified personal risk factors, not elsewhere classified: Secondary | ICD-10-CM | POA: Diagnosis not present

## 2020-05-07 DIAGNOSIS — Z6838 Body mass index (BMI) 38.0-38.9, adult: Secondary | ICD-10-CM

## 2020-05-07 MED ORDER — VITAMIN D (ERGOCALCIFEROL) 1.25 MG (50000 UNIT) PO CAPS: 50000.0000 [IU] | ORAL_CAPSULE | ORAL | 0 refills | Status: DC

## 2020-05-07 NOTE — Telephone Encounter (Signed)
Verbal consent obtained to conduct video visit, via telehealth 

## 2020-05-08 NOTE — Progress Notes (Signed)
TeleHealth Visit:  Due to the COVID-19 pandemic, this visit was completed with telemedicine (audio/video) technology to reduce patient and provider exposure as well as to preserve personal protective equipment.   Amber Parrish has verbally consented to this TeleHealth visit. The patient is located at home, the provider is located at the [facility] office. The participants in this visit include the listed provider and patient. The visit was conducted today via video.  Chief Complaint: OBESITY Amber Parrish is here to discuss her progress with her obesity treatment plan along with follow-up of her obesity related diagnoses. Amber Parrish is on the Category 3 Plan and states she is following her eating plan approximately 70% of the time. Amber Parrish states she is exercising  0 minutes 0 times per week.  Today's visit was #: 20 Starting weight: 251 lbs Starting date: 07/03/2019  Interim History: Amber Parrish currently has cold symptoms. Her son was sick and patient got the cold also. Patient didn't eat much while not feeling well. She was unable to exercise much due to illness the past 2 weeks.  Assessment/Plan:   1. Vitamin D deficiency Amber Parrish's Vitamin D level was 39.1 on 11/15/2019. She is currently taking prescription vitamin D 50,000 IU each week. She denies nausea, vomiting or muscle weakness.  Ref. Range 11/15/2019 11:24  Vitamin D, 25-Hydroxy Latest Ref Range: 30.0 - 100.0 ng/mL 39.1   Plan: Refill Vit D for 1 month, as per below. Low Vitamin D level contributes to fatigue and are associated with obesity, breast, and colon cancer. She agrees to continue to take prescription Vitamin D @50 ,000 IU every week and will follow-up for routine testing of Vitamin D, at least 2-3 times per year to avoid over-replacement.  Refill- Vitamin D, Ergocalciferol, (DRISDOL) 1.25 MG (50000 UNIT) CAPS capsule; Take 1 capsule (50,000 Units total) by mouth every 7 (seven) days.  Dispense: 4 capsule; Refill: 0  2.  Mood disorder (HCC), with emotional eating We recommended Cookie start counseling at last OV. She didn't get a chance to look into it yet but plans to. She is prescribed Wellbutrin.  Plan: Continue Wellbutrin at same dose. Amber Parrish reports she is doing emotionally well and declines dose adjust change or refill at this time. Still pursue counseling.  3. At risk for activity intolerance Amber Parrish was given approximately 15 minutes of exercise intolerance counseling today. She is 50 y.o. female and has risk factors exercise intolerance including obesity. We discussed intensive lifestyle modifications today with an emphasis on specific weight loss instructions and strategies. Amber Parrish will slowly increase activity as tolerated.  4. Class 2 severe obesity with serious comorbidity and body mass index (BMI) of 38.0 to 38.9 in adult, unspecified obesity type (HCC) Amber Parrish is currently in the action stage of change. As such, her goal is to continue with weight loss efforts. She has agreed to the Category 3 Plan.   Exercise goals: 20 minutes 5 days a week of walking using [employer] app  Behavioral modification strategies: meal planning and cooking strategies and planning for success.  Amber Parrish has agreed to follow-up with our clinic in 2 weeks on 05/22/2020 at 0820. She was informed of the importance of frequent follow-up visits to maximize her success with intensive lifestyle modifications for her multiple health conditions.  Objective:   VITALS: Per patient if applicable, see vitals. GENERAL: Alert and in no acute distress. CARDIOPULMONARY: No increased WOB. Speaking in clear sentences.  PSYCH: Pleasant and cooperative. Speech normal rate and rhythm. Affect is appropriate. Insight and  judgement are appropriate. Attention is focused, linear, and appropriate.  NEURO: Oriented as arrived to appointment on time with no prompting.   Lab Results  Component Value Date   CREATININE 0.67 07/03/2019   BUN 13  07/03/2019   NA 140 07/03/2019   K 4.5 07/03/2019   CL 105 07/03/2019   CO2 20 07/03/2019   Lab Results  Component Value Date   ALT 16 07/03/2019   AST 20 07/03/2019   ALKPHOS 84 07/03/2019   BILITOT 0.6 07/03/2019   Lab Results  Component Value Date   HGBA1C 5.7 (H) 11/15/2019   HGBA1C 5.7 (H) 07/03/2019   Lab Results  Component Value Date   INSULIN 17.5 11/15/2019   INSULIN 16.7 07/03/2019   Lab Results  Component Value Date   TSH 1.050 07/03/2019   Lab Results  Component Value Date   CHOL 181 11/15/2019   HDL 53 11/15/2019   LDLCALC 109 (H) 11/15/2019   TRIG 107 11/15/2019   CHOLHDL 3.4 11/15/2019   Lab Results  Component Value Date   WBC 5.0 07/03/2019   HGB 13.3 07/03/2019   HCT 38.5 07/03/2019   MCV 88 07/03/2019   PLT 276 07/03/2019   No results found for: IRON, TIBC, FERRITIN  Attestation Statements:   Reviewed by clinician on day of visit: allergies, medications, problem list, medical history, surgical history, family history, social history, and previous encounter notes.   Edmund Hilda, am acting as Energy manager for [employer], DO.  I have reviewed the above documentation for accuracy and completeness, and I agree with the above. Carlye Grippe, D.O.  The 21st Century Cures Act was signed into law in 2016 which includes the topic of electronic health records.  This provides immediate access to information in MyChart.  This includes consultation notes, operative notes, office notes, lab results and pathology reports.  If you have any questions about what you read please let us know at your next visit so we can discuss your concerns and take corrective action if need be.  We are right here with you.

## 2020-05-22 ENCOUNTER — Other Ambulatory Visit: Payer: Self-pay

## 2020-05-22 ENCOUNTER — Ambulatory Visit (INDEPENDENT_AMBULATORY_CARE_PROVIDER_SITE_OTHER): Payer: 59 | Admitting: Family Medicine

## 2020-05-22 ENCOUNTER — Encounter (INDEPENDENT_AMBULATORY_CARE_PROVIDER_SITE_OTHER): Payer: Self-pay | Admitting: Family Medicine

## 2020-05-22 VITALS — BP 114/73 | HR 75 | Temp 98.8°F | Ht 65.0 in | Wt 228.0 lb

## 2020-05-22 DIAGNOSIS — E559 Vitamin D deficiency, unspecified: Secondary | ICD-10-CM | POA: Diagnosis not present

## 2020-05-22 DIAGNOSIS — Z9189 Other specified personal risk factors, not elsewhere classified: Secondary | ICD-10-CM | POA: Diagnosis not present

## 2020-05-22 DIAGNOSIS — Z6838 Body mass index (BMI) 38.0-38.9, adult: Secondary | ICD-10-CM

## 2020-05-22 DIAGNOSIS — F39 Unspecified mood [affective] disorder: Secondary | ICD-10-CM

## 2020-05-22 MED ORDER — BUPROPION HCL ER (SR) 100 MG PO TB12
100.0000 mg | ORAL_TABLET | Freq: Two times a day (BID) | ORAL | 0 refills | Status: DC
Start: 2020-05-22 — End: 2021-01-23

## 2020-05-22 MED ORDER — VITAMIN D (ERGOCALCIFEROL) 1.25 MG (50000 UNIT) PO CAPS: 50000.0000 [IU] | ORAL_CAPSULE | ORAL | 0 refills | Status: DC

## 2020-05-27 NOTE — Progress Notes (Signed)
Chief Complaint:   OBESITY Amber Parrish is here to discuss her progress with her obesity treatment plan along with follow-up of her obesity related diagnoses.   Today's visit was #: 21 Starting weight: 251 lbs Starting date: 07/03/2019 Today's weight: 228 lbs Today's date: 05/22/2020 Total lbs lost to date: 23 lbs Body mass index is 37.94 kg/m.  Total weight loss percentage to date: -9.16%  Interim History: Amber Parrish is here for a follow up office visit.  Amber Parrish is following the meal plan without concern or issues.  Patient's meal and food recall appears to be accurate and consistent with what is on the plan.  When on plan, her hunger and cravings are well controlled.    Plan:  Reflect / journal on potential.  Nutrition Plan: Category 3 Plan for 80% of the time. Activity: No exercise at this time.  Assessment/Plan:    Medications Discontinued During This Encounter  Medication Reason  . buPROPion (WELLBUTRIN SR) 100 MG 12 hr tablet Reorder  . Vitamin D, Ergocalciferol, (DRISDOL) 1.25 MG (50000 UNIT) CAPS capsule Reorder     Meds ordered this encounter  Medications  . buPROPion (WELLBUTRIN SR) 100 MG 12 hr tablet    Sig: Take 1 tablet (100 mg total) by mouth 2 (two) times daily.    Dispense:  60 tablet    Refill:  0  . Vitamin D, Ergocalciferol, (DRISDOL) 1.25 MG (50000 UNIT) CAPS capsule    Sig: Take 1 capsule (50,000 Units total) by mouth every 7 (seven) days.    Dispense:  4 capsule    Refill:  0       1. Vitamin D deficiency Improving, but not optimized. Current vitamin D is 39.1, tested on 11/15/2019. Optimal goal > 50 ng/dL.   Plan: Continue to take prescription Vitamin D @50 ,000 IU every week as prescribed.  Follow-up for routine testing of Vitamin D, at least 2-3 times per year to avoid over-replacement.  - Refill Vitamin D, Ergocalciferol, (DRISDOL) 1.25 MG (50000 UNIT) CAPS capsule; Take 1 capsule (50,000 Units total) by mouth every 7 (seven) days.   Dispense: 4 capsule; Refill: 0  2. Mood disorder (HCC), with emotional eating Controlled. Medication: Wellbutrin 100 mg daily.  Plan:  Assigned homework of completing food diaries and identifying triggers. Behavior modification techniques were discussed today to help deal with emotional/non-hunger eating behaviors.  Will refill Wellbutrin today, as per below.  - Refill buPROPion (WELLBUTRIN SR) 100 MG 12 hr tablet; Take 1 tablet (100 mg total) by mouth 2 (two) times daily.  Dispense: 60 tablet; Refill: 0  3. At risk for heart disease Due to Amber Parrish's current state of health and medical condition(s), Amber Parrish is at a higher risk for heart disease.  This puts the patient at much greater risk to subsequently develop cardiopulmonary conditions that can significantly affect patient's quality of life in a negative manner.    At least 10 minutes were spent on counseling Amber Parrish about these concerns today, and I stressed the importance of reversing risks factors of obesity, especially truncal and visceral fat, hypertension, hyperlipidemia, and pre-diabetes.  The initial goal is to lose at least 5-10% of starting weight to help reduce these risk factors.  Counseling:  Intensive lifestyle modifications were discussed with Amber Parrish as the most appropriate first line of treatment.  Amber Parrish will continue to work on diet, exercise, and weight loss efforts.  We will continue to reassess these conditions on a fairly regular basis in an attempt to  decrease the patient's overall morbidity and mortality.  Evidence-based interventions for health behavior change were utilized today including the discussion of self monitoring techniques, problem-solving barriers, and SMART goal setting techniques.  Specifically, regarding patient's less desirable eating habits and patterns, we employed the technique of small changes when Amber Parrish has not been able to fully commit to her prudent nutritional plan.  4. Class 2 severe obesity with  serious comorbidity and body mass index (BMI) of 38.0 to 38.9 in adult, unspecified obesity type (HCC)  Course: Amber Parrish is currently in the action stage of change. As such, her goal is to continue with weight loss efforts.   Nutrition goals: Amber Parrish has agreed to the Category 3 Plan.   Exercise goals: All adults should avoid inactivity. Some physical activity is better than none, and adults who participate in any amount of physical activity gain some health benefits.  Behavioral modification strategies: decreasing eating out, meal planning and cooking strategies, keeping healthy foods in the home and avoiding temptations.  Amber Parrish has agreed to follow-up with our clinic in 2-3 weeks. Amber Parrish was informed of the importance of frequent follow-up visits to maximize her success with intensive lifestyle modifications for her multiple health conditions.   Objective:   Blood pressure 114/73, pulse 75, temperature 98.8 F (37.1 C), height 5\' 5"  (1.651 m), weight 228 lb (103.4 kg), SpO2 97 %. Body mass index is 37.94 kg/m.  General: Cooperative, alert, well developed, in no acute distress. HEENT: Conjunctivae and lids unremarkable. Cardiovascular: Regular rhythm.  Lungs: Normal work of breathing. Neurologic: No focal deficits.   Lab Results  Component Value Date   CREATININE 0.67 07/03/2019   BUN 13 07/03/2019   NA 140 07/03/2019   K 4.5 07/03/2019   CL 105 07/03/2019   CO2 20 07/03/2019   Lab Results  Component Value Date   ALT 16 07/03/2019   AST 20 07/03/2019   ALKPHOS 84 07/03/2019   BILITOT 0.6 07/03/2019   Lab Results  Component Value Date   HGBA1C 5.7 (H) 11/15/2019   HGBA1C 5.7 (H) 07/03/2019   Lab Results  Component Value Date   INSULIN 17.5 11/15/2019   INSULIN 16.7 07/03/2019   Lab Results  Component Value Date   TSH 1.050 07/03/2019   Lab Results  Component Value Date   CHOL 181 11/15/2019   HDL 53 11/15/2019   LDLCALC 109 (H) 11/15/2019   TRIG 107 11/15/2019    CHOLHDL 3.4 11/15/2019   Lab Results  Component Value Date   WBC 5.0 07/03/2019   HGB 13.3 07/03/2019   HCT 38.5 07/03/2019   MCV 88 07/03/2019   PLT 276 07/03/2019   Attestation Statements:   Reviewed by clinician on day of visit: allergies, medications, problem list, medical history, surgical history, family history, social history, and previous encounter notes.  I, 07/05/2019, CMA, am acting as Insurance claims handler for Energy manager, DO.  I have reviewed the above documentation for accuracy and completeness, and I agree with the above. [employer], D.O.  The 21st Century Cures Act was signed into law in 2016 which includes the topic of electronic health records.  This provides immediate access to information in MyChart.  This includes consultation notes, operative notes, office notes, lab results and pathology reports.  If you have any questions about what you read please let 2017 know at your next visit so we can discuss your concerns and take corrective action if need be.  We are right here with you.

## 2020-06-06 ENCOUNTER — Encounter (INDEPENDENT_AMBULATORY_CARE_PROVIDER_SITE_OTHER): Payer: Self-pay

## 2020-06-06 ENCOUNTER — Ambulatory Visit (INDEPENDENT_AMBULATORY_CARE_PROVIDER_SITE_OTHER): Payer: 59 | Admitting: Family Medicine

## 2021-01-23 ENCOUNTER — Encounter: Payer: Self-pay | Admitting: Family Medicine

## 2021-01-23 ENCOUNTER — Telehealth: Payer: 59 | Admitting: Family Medicine

## 2021-01-23 ENCOUNTER — Other Ambulatory Visit: Payer: Self-pay

## 2021-01-23 VITALS — Ht 64.0 in | Wt 228.0 lb

## 2021-01-23 DIAGNOSIS — R519 Headache, unspecified: Secondary | ICD-10-CM | POA: Diagnosis not present

## 2021-01-23 NOTE — Progress Notes (Signed)
   Subjective:    Patient ID: Amber Parrish, female    DOB: 24-Jun-1970, 50 y.o.   MRN: [id number]  HPI Documentation for virtual audio and video telecommunications through [facility] encounter: The patient was located at home. 2 patient identifiers used.  The provider was located in the office. The patient did consent to this visit and is aware of possible charges through their insurance for this visit. The other persons participating in this telemedicine service were none. Time spent on call was 3 minutes and in review of previous records >18 minutes total for counseling and coordination of care This virtual service is not related to other E/M service within previous 7 days.  She states that for the last 2 months she has noted headaches in the right occipital area that are dull in nature that do get worse with motion.  No blurred vision, double vision, weakness, numbness or tingling.  She has not had these headaches before.  She has been using ibuprofen for the pain and getting some relief from it.  No previous history of difficulty with headaches. Review of Systems     Objective:   Physical Exam  Alert and in no distress otherwise not examined      Assessment & Plan:  Nonintractable headache, unspecified chronicity pattern, unspecified headache type I explained that since this is a relatively new headache, she is going to need to come in for more thorough evaluation and possibly scanning.

## 2021-01-31 ENCOUNTER — Ambulatory Visit: Payer: 59 | Admitting: Family Medicine

## 2021-01-31 ENCOUNTER — Other Ambulatory Visit: Payer: Self-pay

## 2021-01-31 VITALS — BP 144/92 | HR 81 | Temp 98.4°F | Wt 244.8 lb

## 2021-01-31 DIAGNOSIS — R519 Headache, unspecified: Secondary | ICD-10-CM | POA: Diagnosis not present

## 2021-01-31 DIAGNOSIS — Z23 Encounter for immunization: Secondary | ICD-10-CM

## 2021-01-31 NOTE — Patient Instructions (Signed)
You can take 2 Tylenol 4 times per day

## 2021-01-31 NOTE — Progress Notes (Signed)
   Subjective:    Patient ID: Amber Parrish, female    DOB: May 31, 1970, 51 y.o.   MRN: [id number]  HPI She is here for further evaluation of a 74-month history of right occipital headache but no blurred vision, double vision, numbness, tingling, falls.  She does complain of occasional dizziness.  This was somewhat intermittent at first and is now becoming more constant but is never really gone completely away.  She has been intermittently taking OTC meds.  No previous history of headaches.   Review of Systems     Objective:   Physical Exam Alert and in no distress.  EOMI.  Finger-to-nose did worsen her headache symptoms.  DTRs normal.  Cerebellar testing normal.  No pronator drift tympanic membranes and canals are normal. Pharyngeal area is normal. Neck is supple without adenopathy or thyromegaly. Cardiac exam shows a regular sinus rhythm without murmurs or gallops. Lungs are clear to auscultation.        Assessment & Plan:   Unilateral occipital headache - Plan: MR MRV HEAD W WO CONTRAST  Need for influenza vaccination - Plan: Flu Vaccine QUAD 69mo+IM (Fluarix, Fluzone & Alfiuria Quad PF) Since this is a new onset of his headache on the right occipital area with positive finger-to-nose causing worsening of his symptoms I think an MRI is totally appropriate.  She will try OTC meds until this is further evaluated.

## 2021-02-24 ENCOUNTER — Ambulatory Visit
Admission: RE | Admit: 2021-02-24 | Discharge: 2021-02-24 | Disposition: A | Discharge status: Home / Self Care | Payer: 59 | Source: Ambulatory Visit | Attending: Family Medicine | Admitting: Family Medicine

## 2021-02-24 MED ORDER — GADOBENATE DIMEGLUMINE 529 MG/ML IV SOLN
20.0000 mL | Freq: Once | INTRAVENOUS | Status: AC | PRN
  Administered 2021-02-24: 20 mL via INTRAVENOUS

## 2021-02-26 ENCOUNTER — Telehealth: Payer: Self-pay

## 2021-02-26 ENCOUNTER — Other Ambulatory Visit: Payer: Self-pay

## 2021-02-26 ENCOUNTER — Other Ambulatory Visit: Payer: Self-pay | Admitting: Family Medicine

## 2021-02-26 NOTE — Telephone Encounter (Signed)
Called pt to advise of the test she had done and we need another MRI that takes a different view of the brain. Pt advised it was a 800.00 co pay and that she just wants to find out what's going on. Per  Blaine Hamper at [facility] they will wave the co pay fee for pt

## 2021-02-26 NOTE — Addendum Note (Signed)
Addended by: Ronnald Nian on: 02/26/2021 10:26 AM   Modules accepted: Orders

## 2021-03-04 ENCOUNTER — Other Ambulatory Visit: Payer: Self-pay

## 2021-03-04 ENCOUNTER — Ambulatory Visit
Admission: RE | Admit: 2021-03-04 | Discharge: 2021-03-04 | Disposition: A | Discharge status: Home / Self Care | Payer: 59 | Source: Ambulatory Visit | Attending: Family Medicine | Admitting: Family Medicine

## 2021-03-04 MED ORDER — GADOBENATE DIMEGLUMINE 529 MG/ML IV SOLN
20.0000 mL | Freq: Once | INTRAVENOUS | Status: AC | PRN
  Administered 2021-03-04: 20 mL via INTRAVENOUS

## 2021-03-06 ENCOUNTER — Encounter: Payer: Self-pay | Admitting: Neurology

## 2021-03-06 NOTE — Addendum Note (Signed)
Addended by: Ronnald Nian on: 03/06/2021 09:43 AM   Modules accepted: Orders

## 2021-03-10 DIAGNOSIS — Z0289 Encounter for other administrative examinations: Secondary | ICD-10-CM

## 2021-04-24 ENCOUNTER — Encounter (INDEPENDENT_AMBULATORY_CARE_PROVIDER_SITE_OTHER): Payer: Self-pay | Admitting: Family Medicine

## 2021-04-24 ENCOUNTER — Other Ambulatory Visit: Payer: Self-pay

## 2021-04-24 ENCOUNTER — Ambulatory Visit (INDEPENDENT_AMBULATORY_CARE_PROVIDER_SITE_OTHER): Payer: 59 | Admitting: Family Medicine

## 2021-04-24 VITALS — BP 123/81 | HR 69 | Temp 98.1°F | Ht 64.0 in | Wt 246.0 lb

## 2021-04-24 DIAGNOSIS — Z1331 Encounter for screening for depression: Secondary | ICD-10-CM

## 2021-04-24 DIAGNOSIS — R5383 Other fatigue: Secondary | ICD-10-CM

## 2021-04-24 DIAGNOSIS — R7303 Prediabetes: Secondary | ICD-10-CM

## 2021-04-24 DIAGNOSIS — Z9189 Other specified personal risk factors, not elsewhere classified: Secondary | ICD-10-CM | POA: Diagnosis not present

## 2021-04-24 DIAGNOSIS — F39 Unspecified mood [affective] disorder: Secondary | ICD-10-CM

## 2021-04-24 DIAGNOSIS — R0602 Shortness of breath: Secondary | ICD-10-CM | POA: Diagnosis not present

## 2021-04-24 DIAGNOSIS — E559 Vitamin D deficiency, unspecified: Secondary | ICD-10-CM

## 2021-04-24 DIAGNOSIS — E785 Hyperlipidemia, unspecified: Secondary | ICD-10-CM

## 2021-04-24 DIAGNOSIS — E66813 Obesity, class 3: Secondary | ICD-10-CM

## 2021-04-24 DIAGNOSIS — Z6841 Body Mass Index (BMI) 40.0 and over, adult: Secondary | ICD-10-CM

## 2021-04-25 LAB — CBC WITH DIFFERENTIAL/PLATELET
Basophils Absolute: 0 10*3/uL (ref 0.0–0.2)
Basos: 1 %
EOS (ABSOLUTE): 0.3 10*3/uL (ref 0.0–0.4)
Eos: 6 %
Hematocrit: 41.5 % (ref 34.0–46.6)
Hemoglobin: 13.5 g/dL (ref 11.1–15.9)
Immature Grans (Abs): 0 10*3/uL (ref 0.0–0.1)
Immature Granulocytes: 0 %
Lymphocytes Absolute: 1.3 10*3/uL (ref 0.7–3.1)
Lymphs: 28 %
MCH: 29.2 pg (ref 26.6–33.0)
MCHC: 32.5 g/dL (ref 31.5–35.7)
MCV: 90 fL (ref 79–97)
Monocytes Absolute: 0.4 10*3/uL (ref 0.1–0.9)
Monocytes: 8 %
Neutrophils Absolute: 2.5 10*3/uL (ref 1.4–7.0)
Neutrophils: 57 %
Platelets: 270 10*3/uL (ref 150–450)
RBC: 4.63 x10E6/uL (ref 3.77–5.28)
RDW: 13.1 % (ref 11.7–15.4)
WBC: 4.4 10*3/uL (ref 3.4–10.8)

## 2021-04-25 LAB — COMPREHENSIVE METABOLIC PANEL
ALT: 23 IU/L (ref 0–32)
AST: 21 IU/L (ref 0–40)
Albumin/Globulin Ratio: 2 (ref 1.2–2.2)
Albumin: 4.7 g/dL (ref 3.8–4.8)
Alkaline Phosphatase: 77 IU/L (ref 44–121)
BUN/Creatinine Ratio: 21 (ref 9–23)
BUN: 14 mg/dL (ref 6–24)
Bilirubin Total: 0.4 mg/dL (ref 0.0–1.2)
CO2: 24 mmol/L (ref 20–29)
Calcium: 9.8 mg/dL (ref 8.7–10.2)
Chloride: 102 mmol/L (ref 96–106)
Creatinine, Ser: 0.66 mg/dL (ref 0.57–1.00)
Globulin, Total: 2.4 g/dL (ref 1.5–4.5)
Glucose: 100 mg/dL — ABNORMAL HIGH (ref 70–99)
Potassium: 4.5 mmol/L (ref 3.5–5.2)
Sodium: 139 mmol/L (ref 134–144)
Total Protein: 7.1 g/dL (ref 6.0–8.5)
eGFR: 108 mL/min/{1.73_m2} (ref >59)

## 2021-04-25 LAB — LIPID PANEL WITH LDL/HDL RATIO
Cholesterol, Total: 190 mg/dL (ref 100–199)
HDL: 80 mg/dL (ref >39)
LDL Chol Calc (NIH): 96 mg/dL (ref 0–99)
LDL/HDL Ratio: 1.2 ratio (ref 0.0–3.2)
Triglycerides: 74 mg/dL (ref 0–149)
VLDL Cholesterol Cal: 14 mg/dL (ref 5–40)

## 2021-04-25 LAB — HEMOGLOBIN A1C
Est. average glucose Bld gHb Est-mCnc: 117 mg/dL
Hgb A1c MFr Bld: 5.7 % — ABNORMAL HIGH (ref 4.8–5.6)

## 2021-04-25 LAB — INSULIN, RANDOM: INSULIN: 13.3 u[IU]/mL (ref 2.6–24.9)

## 2021-04-25 LAB — FOLATE: Folate: 15.8 ng/mL (ref >3.0)

## 2021-04-25 LAB — TSH: TSH: 1.12 u[IU]/mL (ref 0.450–4.500)

## 2021-04-25 LAB — VITAMIN D 25 HYDROXY (VIT D DEFICIENCY, FRACTURES): Vit D, 25-Hydroxy: 31 ng/mL (ref 30.0–100.0)

## 2021-04-25 LAB — VITAMIN B12: Vitamin B-12: 711 pg/mL (ref 232–1245)

## 2021-04-25 LAB — T4, FREE: Free T4: 1.32 ng/dL (ref 0.82–1.77)

## 2021-04-28 NOTE — Progress Notes (Signed)
NEUROLOGY CONSULTATION NOTE  Amber Parrish MRN: [id number] DOB: 04-22-1970  Referring provider: Jill Alexanders, MD Primary care provider: Jill Alexanders, MD  Reason for consult:  headaches  Assessment/Plan:   Chronic daily headaches - semiology at this time seems consistent with tension-type headaches  Start Trokendi XR 50mg  at bedtime.  Given her job, I would like to minimize cognitive deficits (less likely with ER topiramate as opposed to IR topiramate).  Tizanidine 2mg  as needed for acute treatment.  May use OTC analgesics/NSAIDs but limit use of pain relievers to no more than 2 days out of week to prevent risk of rebound or medication-overuse headache. Keep headache diary Refer to ophthalmology  Follow up 6 months.   Subjective:  Amber Parrish is a 51 year old right-handed female who presents for headaches.  History supplemented by referring provider's note.  She began having headaches around the summer 2022.   It is a unilateral pressure occipital headache (either side) that begins as 2-3/10 in the morning and gradually spreads to the forehead at a 6-7/10 intensity late in the day.  Sometimes neck pain by end of day.  She has photosensitivity but denies nausea, vomiting, phonophobia, autonomic symptoms, dizziness, numbness or weakness.  They occur daily.  Bright light and intense smells are triggers.  Rest and hydration help relieve it.  MRI and MRV of brain with and without contrast in November personally reviewed were unremarkable.  By late November, she started noticing blurred vision whenever concentrating/reading or using computer but denies visual obscurations.  She has not recently had an eye exam.  When headaches are more severe, she may hear a whooshing in her head.  She tried treating with ibuprofen but since ineffective, she has not taken any analgesics.  Her father passed away in early 2020/08/02 but no new stressors by the time headaches started.  She works as an  Optometrist.  Work and home life is the same.  No injuries or illness.   Current NSAIDS/analgesics:  none Current triptans:  none Current ergotamine:  none Current anti-emetic:  none Current muscle relaxants:  none Current Antihypertensive medications:  none Current Antidepressant medications:  none Current Anticonvulsant medications:  none Current anti-CGRP:  none Current Vitamins/Herbal/Supplements:  none Current Antihistamines/Decongestants:  none Other therapy:  none Hormone/birth control:  none   Past NSAIDS/analgesics:  ibuprofen Past abortive triptans:  none Past abortive ergotamine:  none Past muscle relaxants:  none Past anti-emetic:  none Past antihypertensive medications:  furosemide Past antidepressant medications:  Wellbutrin Past anticonvulsant medications:  none Past anti-CGRP:  none Past vitamins/Herbal/Supplements:  none Past antihistamines/decongestants:  Benadryl Other past therapies:  none  Caffeine:  occasional coffee Diet:  Hydrates.  Watches her diet.  No soda.  She goes to the [facility] [place]. Exercise:  not routine Depression:  no; Anxiety: stable  Other pain:  no Sleep hygiene:  wakes up more often in the middle of the night. Maybe 6 hours of sleep.  No daytime sleepiness.   Family history of headache:  not that she knows.     PAST MEDICAL HISTORY: Past Medical History:  Diagnosis Date   Bilateral hand swelling    Headache    Heartburn    Joint pain    Muscle pain    Other fatigue    Overweight    Shortness of breath on exertion     PAST SURGICAL HISTORY: Past Surgical History:  Procedure Laterality Date   ANTERIOR CRUCIATE LIGAMENT REPAIR  08/1998   CESAREAN SECTION  08/18/2001    MEDICATIONS: No current outpatient medications on file prior to visit.   No current facility-administered medications on file prior to visit.    ALLERGIES: Allergies  Allergen Reactions   Penicillins     FAMILY  HISTORY: Family History  Problem Relation Age of Onset   Diabetes Mother    Hypertension Mother    Obesity Mother    Diabetes Father    Alcohol abuse Father    Arthritis Maternal Grandmother    Cancer Maternal Grandmother    Diabetes Maternal Grandmother    Hypertension Maternal Grandmother     Objective:  Blood pressure (!) 155/87, pulse 84, height 5\' 4"  (1.626 m), weight 251 lb 3.2 oz (113.9 kg), SpO2 97 %. General: No acute distress.  Patient appears well-groomed.   Head:  Normocephalic/atraumatic Eyes:  fundi examined but not visualized Neck: supple, no paraspinal tenderness, full range of motion Back: No paraspinal tenderness Heart: regular rate and rhythm Lungs: Clear to auscultation bilaterally. Vascular: No carotid bruits. Neurological Exam: Mental status: alert and oriented to person, place, and time, recent and remote memory intact, fund of knowledge intact, attention and concentration intact, speech fluent and not dysarthric, language intact. Cranial nerves: CN I: not tested CN II: pupils equal, round and reactive to light, visual fields intact CN III, IV, VI:  full range of motion, no nystagmus, no ptosis CN V: facial sensation intact. CN VII: upper and lower face symmetric CN VIII: hearing intact CN IX, X: gag intact, uvula midline CN XI: sternocleidomastoid and trapezius muscles intact CN XII: tongue midline Bulk & Tone: normal, no fasciculations. Motor:  muscle strength 5/5 throughout Sensation:  Pinprick, temperature and vibratory sensation intact. Deep Tendon Reflexes:  2+ throughout,  toes downgoing.   Finger to nose testing:  Without dysmetria.   Heel to shin:  Without dysmetria.   Gait:  Normal station and stride.  Romberg negative.    Thank you for allowing me to take part in the care of this patient.  Metta Clines, DO  CC: Jill Alexanders, MD

## 2021-04-28 NOTE — Progress Notes (Signed)
Chief Complaint:   OBESITY Amber Parrish (MR# [id number]) is a 51 y.o. female who presents for evaluation and treatment of obesity and related comorbidities. Current BMI is Body mass index is 42.23 kg/m. Amber Parrish has been struggling with her weight for many years and has been unsuccessful in either losing weight, maintaining weight loss, or reaching her healthy weight goal.  Amber Parrish loss her father 3 days after her last appointment with me, and she has been taking care of her mother and not caring for herself.  Amber Parrish is currently in the action stage of change and ready to dedicate time achieving and maintaining a healthier weight. Amber Parrish is interested in becoming our patient and working on intensive lifestyle modifications including (but not limited to) diet and exercise for weight loss.  Amber Parrish's habits were reviewed today and are as follows: Her family eats meals together, she thinks her family will eat healthier with her, her desired weight loss is 66 lbs, she has been heavy most of her life, she started gaining weight 20 years ago, her heaviest weight ever was 250 pounds, she skips meals frequently, she is frequently drinking liquids with calories, she frequently makes poor food choices, she has problems with excessive hunger, she frequently eats larger portions than normal, and she struggles with emotional eating.  Depression Screen Amber Parrish's Food and Mood (modified PHQ-9) score was 16.  Depression screen PHQ 2/9 04/24/2021  Decreased Interest 3  Down, Depressed, Hopeless 1  PHQ - 2 Score 4  Altered sleeping 2  Tired, decreased energy 2  Change in appetite 3  Feeling bad or failure about yourself  3  Trouble concentrating 1  Moving slowly or fidgety/restless 1  Suicidal thoughts 0  PHQ-9 Score 16  Difficult doing work/chores Somewhat difficult   Subjective:   1. Other fatigue Amber Parrish admits to daytime somnolence and admits to waking up still tired. Patent has a  history of symptoms of daytime fatigue, morning fatigue, and morning headache. Amber Parrish generally gets 6 hours of sleep per night, and states that she has difficulty falling asleep. Snoring is present. Apneic episodes are not present. Epworth Sleepiness Score is 5. Amber Parrish notes a newer onset of headaches secondary to stress. She is not needing any medications for this at this time, and she notes it is not too concerning.   2. Shortness of breath on exertion Amber Parrish notes increasing shortness of breath with exercising and seems to be worsening over time with weight gain. She notes getting out of breath sooner with activity than she used to. This has not gotten worse recently. Amber Parrish denies shortness of breath at rest or orthopnea.  3. Pre-diabetes Amber Parrish will continue to work on weight loss, exercise, and decreasing simple carbohydrates to help decrease the risk of diabetes.   4. Vitamin D deficiency Amber Parrish is not currently taking Vit D supplementation.  5. Hyperlipidemia, unspecified hyperlipidemia type Amber Parrish has hyperlipidemia and has been trying to improve her cholesterol levels with intensive lifestyle modification including a low saturated fat diet, exercise and weight loss. She denies any chest pain, claudication or myalgias.  6. Mood disorder ([place]), with emotional eating Amber Parrish's PHQ-9 score is 16, but she states she is doing well. She declines the need for counseling. She used to be on Wellbutrin with [place] in the past, but she is not on anything currently.   7. At risk for depression Amber Parrish is at elevated risk of depression due to the loss of her father recently and stressors  of life.  Assessment/Plan:   Orders Placed This Encounter  Procedures   Vitamin B12   CBC with Differential/Platelet   Comprehensive metabolic panel   Folate   Hemoglobin A1c   Insulin, random   Lipid Panel With LDL/HDL Ratio   T4, free   TSH   VITAMIN D 25 Hydroxy (Vit-D Deficiency, Fractures)     Medications Discontinued During This Encounter  Medication Reason   diphenhydrAMINE HCl (BENADRYL ALLERGY PO)    ibuprofen (ADVIL) 200 MG tablet    Multiple Vitamin (MULTIVITAMIN) capsule      No orders of the defined types were placed in this encounter.    1. Other fatigue Amber Parrish does feel that her weight is causing her energy to be lower than it should be. Fatigue may be related to obesity, depression or many other causes. Labs will be ordered, and in the meanwhile, Amber Parrish will focus on self care including making healthy food choices, increasing physical activity and focusing on stress reduction.  2. Shortness of breath on exertion Amber Parrish does feel that she gets out of breath more easily that she used to when she exercises. Amber Parrish's shortness of breath appears to be obesity related and exercise induced. She has agreed to work on weight loss and gradually increase exercise to treat her exercise induced shortness of breath. Will continue to monitor closely.  3. Pre-diabetes We will check labs today. Amber Parrish will work on weight loss, exercise, and decreasing simple carbohydrates to help decrease the risk of diabetes.   - CBC with Differential/Platelet - Comprehensive metabolic panel - Hemoglobin A1c - Insulin, random - T4, free - TSH  4. Vitamin D deficiency Low Vitamin D level contributes to fatigue and are associated with obesity, breast, and colon cancer. We will check labs today, and Amber Parrish will follow-up for routine testing of Vitamin D, at least 2-3 times per year to avoid over-replacement.  - Vitamin B12 - VITAMIN D 25 Hydroxy (Vit-D Deficiency, Fractures)  5. Hyperlipidemia, unspecified hyperlipidemia type Cardiovascular risk and specific lipid/LDL goals reviewed.  We discussed several lifestyle modifications today. We will check labs today. Amber Parrish will work on diet, exercise and weight loss efforts. Orders and follow up as documented in patient record.    Counseling Intensive lifestyle modifications are the first line treatment for this issue. Dietary changes: Increase soluble fiber. Decrease simple carbohydrates. Exercise changes: Moderate to vigorous-intensity aerobic activity 150 minutes per week if tolerated. Lipid-lowering medications: see documented in medical record.  - CBC with Differential/Platelet - Comprehensive metabolic panel - Folate - Lipid Panel With LDL/HDL Ratio  6. Mood disorder ([place]), with emotional eating We will consider medications in the future if needed. Iantha declined a referral to see Dr. Mallie Mussel, our Bariatric Psychologist. Handout was given on mindful eating habits and exercises were discussed. Orders and follow up as documented in patient record.   7. At risk for depression Skyleen was given approximately 23 minutes of depression risk counseling today. She has risk factors for depression including stress. We discussed the importance of a healthy work life balance, a healthy relationship with food and a good support system.  Repetitive spaced learning was employed today to elicit superior memory formation and behavioral change.  8. Obesity with current BMI of 42.2 Chrystian is currently in the action stage of change and her goal is to continue with weight loss efforts. I recommend Merinda begin the structured treatment plan as follows:  She has agreed to the Category 3 Plan.  Exercise goals: As  is.   Behavioral modification strategies: increasing lean protein intake, no skipping meals, meal planning and cooking strategies, and planning for success.  She was informed of the importance of frequent follow-up visits to maximize her success with intensive lifestyle modifications for her multiple health conditions. She was informed we would discuss her lab results at her next visit unless there is a critical issue that needs to be addressed sooner. Sabrinia agreed to keep her next visit at the agreed upon time to  discuss these results.  Objective:   Blood pressure 123/81, pulse 69, temperature 98.1 F (36.7 C), height 5\' 4"  (1.626 m), weight 246 lb (111.6 kg), SpO2 98 %. Body mass index is 42.23 kg/m.  EKG: Normal sinus rhythm, rate (unable to obtain).  Indirect Calorimeter completed today shows a VO2 of 300 and a REE of 2074.  Her calculated basal metabolic rate is [id number] thus her basal metabolic rate is better than expected.  General: Cooperative, alert, well developed, in no acute distress. HEENT: Conjunctivae and lids unremarkable. Cardiovascular: Regular rhythm.  Lungs: Normal work of breathing. Neurologic: No focal deficits.   Lab Results  Component Value Date   CREATININE 0.66 04/24/2021   BUN 14 04/24/2021   NA 139 04/24/2021   K 4.5 04/24/2021   CL 102 04/24/2021   CO2 24 04/24/2021   Lab Results  Component Value Date   ALT 23 04/24/2021   AST 21 04/24/2021   ALKPHOS 77 04/24/2021   BILITOT 0.4 04/24/2021   Lab Results  Component Value Date   HGBA1C 5.7 (H) 04/24/2021   HGBA1C 5.7 (H) 11/15/2019   HGBA1C 5.7 (H) 07/03/2019   Lab Results  Component Value Date   INSULIN 13.3 04/24/2021   INSULIN 17.5 11/15/2019   INSULIN 16.7 07/03/2019   Lab Results  Component Value Date   TSH 1.120 04/24/2021   Lab Results  Component Value Date   CHOL 190 04/24/2021   HDL 80 04/24/2021   LDLCALC 96 04/24/2021   TRIG 74 04/24/2021   CHOLHDL 3.4 11/15/2019   Lab Results  Component Value Date   WBC 4.4 04/24/2021   HGB 13.5 04/24/2021   HCT 41.5 04/24/2021   MCV 90 04/24/2021   PLT 270 04/24/2021   No results found for: IRON, TIBC, FERRITIN  Attestation Statements:   Reviewed by clinician on day of visit: allergies, medications, problem list, medical history, surgical history, family history, social history, and previous encounter notes.   Wilhemena Durie, am acting as transcriptionist for [employer], DO.  I have reviewed the above documentation for accuracy  and completeness, and I agree with the above. Marjory Sneddon, D.O.  The [place] was signed into law in 2016 which includes the topic of electronic health records.  This provides immediate access to information in MyChart.  This includes consultation notes, operative notes, office notes, lab results and pathology reports.  If you have any questions about what you read please let us know at your next visit so we can discuss your concerns and take corrective action if need be.  We are right here with you.

## 2021-04-29 ENCOUNTER — Ambulatory Visit (INDEPENDENT_AMBULATORY_CARE_PROVIDER_SITE_OTHER): Payer: 59 | Admitting: Family Medicine

## 2021-04-29 ENCOUNTER — Ambulatory Visit: Payer: 59 | Admitting: Neurology

## 2021-04-29 ENCOUNTER — Encounter: Payer: Self-pay | Admitting: Neurology

## 2021-04-29 ENCOUNTER — Other Ambulatory Visit: Payer: Self-pay

## 2021-04-29 VITALS — BP 155/87 | HR 84 | Ht 64.0 in | Wt 251.2 lb

## 2021-04-29 DIAGNOSIS — R519 Headache, unspecified: Secondary | ICD-10-CM | POA: Diagnosis not present

## 2021-04-29 MED ORDER — TIZANIDINE HCL 2 MG PO TABS: ORAL_TABLET | ORAL | 5 refills | Status: DC

## 2021-04-29 MED ORDER — TROKENDI XR 50 MG PO CP24: 50.0000 mg | ORAL_CAPSULE | Freq: Every day | ORAL | 5 refills | Status: DC

## 2021-04-29 NOTE — Progress Notes (Signed)
Medication Samples have been provided to the patient.  Drug name: Trokendi       Strength: 50 mg        Qty: 2  LOTTO:[id number]  Exp.Date: 04/2021  Dosing instructions: daily  The patient has been instructed regarding the correct time, dose, and frequency of taking this medication, including desired effects and most common side effects.   Venetia Night 11:42 AM 04/29/2021

## 2021-04-29 NOTE — Patient Instructions (Signed)
Start Trokendi XR 50mg  at bedtime.  If no improvement in about 6 weeks, contact me Limit use of pain relievers (ibuprofen, acetaminophen, Excedrin) to no more than 2 days out of week to prevent risk of rebound or medication-overuse headache. When you get a severe headache, may take tizanidine 2mg  as needed (up to three times daily as needed) but monitor for drowsiness Refer to the eye doctor Follow up 6 months.

## 2021-05-08 ENCOUNTER — Other Ambulatory Visit: Payer: Self-pay

## 2021-05-08 ENCOUNTER — Ambulatory Visit (INDEPENDENT_AMBULATORY_CARE_PROVIDER_SITE_OTHER): Payer: 59 | Admitting: Family Medicine

## 2021-05-08 ENCOUNTER — Encounter (INDEPENDENT_AMBULATORY_CARE_PROVIDER_SITE_OTHER): Payer: Self-pay | Admitting: Family Medicine

## 2021-05-08 VITALS — BP 132/79 | HR 69 | Temp 97.9°F | Ht 64.0 in | Wt 242.0 lb

## 2021-05-08 DIAGNOSIS — E7849 Other hyperlipidemia: Secondary | ICD-10-CM | POA: Diagnosis not present

## 2021-05-08 DIAGNOSIS — E559 Vitamin D deficiency, unspecified: Secondary | ICD-10-CM

## 2021-05-08 DIAGNOSIS — G8929 Other chronic pain: Secondary | ICD-10-CM

## 2021-05-08 DIAGNOSIS — Z6841 Body Mass Index (BMI) 40.0 and over, adult: Secondary | ICD-10-CM

## 2021-05-08 DIAGNOSIS — Z9189 Other specified personal risk factors, not elsewhere classified: Secondary | ICD-10-CM | POA: Diagnosis not present

## 2021-05-08 DIAGNOSIS — R7303 Prediabetes: Secondary | ICD-10-CM | POA: Diagnosis not present

## 2021-05-08 DIAGNOSIS — R519 Headache, unspecified: Secondary | ICD-10-CM

## 2021-05-08 DIAGNOSIS — K760 Fatty (change of) liver, not elsewhere classified: Secondary | ICD-10-CM

## 2021-05-08 DIAGNOSIS — E669 Obesity, unspecified: Secondary | ICD-10-CM

## 2021-05-13 NOTE — Patient Instructions (Signed)
The 10-year ASCVD risk score (Arnett DK, et al., 2019) is: 0.6%   Values used to calculate the score:     Age: 51 years     Sex: Female     Is Non-Hispanic African American: No     Diabetic: No     Tobacco smoker: No     Systolic Blood Pressure: 132 mmHg     Is BP treated: No     HDL Cholesterol: 80 mg/dL     Total Cholesterol: 190 mg/dL

## 2021-05-15 MED ORDER — VITAMIN D (ERGOCALCIFEROL) 1.25 MG (50000 UNIT) PO CAPS: 50000.0000 [IU] | ORAL_CAPSULE | ORAL | 0 refills | Status: DC

## 2021-05-15 NOTE — Progress Notes (Signed)
Chief Complaint:   OBESITY Amber Parrish is here to discuss her progress with her obesity treatment plan along with follow-up of her obesity related diagnoses. Amber Parrish is on the Category 3 Plan and states she is following her eating plan approximately 85% of the time. Amber Parrish states she is not currently exercising.  Today's visit was #: 2 Starting weight: 246 lbs Starting date: 04/24/2021 Today's weight: 242 lbs Today's date: 05/08/2021 Total lbs lost to date: 4 Total lbs lost since last in-office visit: 4  Interim History: Amber Parrish is here today for her first follow-up office visit since starting the program with [place].  All blood work/ lab tests that were recently ordered by myself or an outside provider were reviewed with patient today per their request.   Extended time was spent counseling her on all new disease processes that were discovered or preexisting ones that are affected by BMI.  she understands that many of these abnormalities will need to monitored regularly along with the current treatment plan of prudent dietary changes, in which we are making each and every office visit, to improve these health parameters. Amber Parrish is here for a follow up office visit.  We reviewed her meal plan and questions were answered.  Patient's food recall appears to be accurate and consistent with what is on plan when she is following it.   When eating on plan, her hunger and cravings are well controlled. She did great. Pt did have a lot of headache issues as of late and was seen by specialist.  Subjective:   1. Pre-diabetes Discussed labs with patient today. Pt's A1c is 5.7 with an insulin level of 13.3. No change in A1c from prior.  2. Hepatic steatosis Discussed labs with patient today. H/o elevated ALT and AST. Both are now within normal limits. Pt denies regular alcohol or Tylenol use.  3. Other hyperlipidemia Discussed labs with patient today. No need for meds at this  time.  The 10-year ASCVD risk score (Arnett DK, et al., 2019) is: 0.6%   Values used to calculate the score:     Age: 12 years     Sex: Female     Is Non-Hispanic African American: No     Diabetic: No     Tobacco smoker: No     Systolic Blood Pressure: 132 mmHg     Is BP treated: No     HDL Cholesterol: 80 mg/dL     Total Cholesterol: 190 mg/dL  4. Chronic nonintractable headache, unspecified headache type Discussed labs with patient today. Neurology started pt on Zanaflex and Trokendi-XR.  5. Vitamin D deficiency Discussed labs with patient today. Vit D level is sub-optimal at 31.0.  6. At risk for impaired function of liver Amber Parrish is at risk for impaired function of liver due to likely diagnosis of fatty liver as evidenced by recent elevated liver enzymes.   Assessment/Plan:  No orders of the defined types were placed in this encounter.   There are no discontinued medications.   Meds ordered this encounter  Medications   Vitamin D, Ergocalciferol, (DRISDOL) 1.25 MG (50000 UNIT) CAPS capsule    Sig: Take 1 capsule (50,000 Units total) by mouth every 7 (seven) days.    Dispense:  4 capsule    Refill:  0     1. Pre-diabetes Repeat labs in 3 months. Consider meds prn in the future. Handouts given on pre-diabetes after extensive counseling.  2. Hepatic steatosis We discussed how  this condition is obesity related. Amber Parrish was educated the importance of weight loss. Amber Parrish agreed to continue with her weight loss efforts with healthier diet and exercise as an essential part of her treatment plan.  3. Other hyperlipidemia Levels improved from prior, with decrease in LDL and increase in HDL. Pt was educated and counseled on disease. Decreased sat and trans fats.   4. Chronic nonintractable headache, unspecified headache type Walking for stress management, discussed with pt. Continue meds per neurology and discussion on increasing water intake to 100 oz per day.  5.  Vitamin D deficiency Plan: - Discussed importance of vitamin D to their health and well-being.  - possible symptoms of low Vitamin D can be low energy, depressed mood, muscle aches, joint aches, osteoporosis etc. - low Vitamin D levels may be linked to an increased risk of cardiovascular events and even increased risk of cancers- such as colon and breast.  - I recommend pt Start take a 50K IU weekly prescription vit D - see script below   - Informed patient this may be a lifelong thing, and she was encouraged to continue to take the medicine until told otherwise.   - we will need to monitor levels regularly (every 3-4 mo on average) to keep levels within normal limits.  - weight loss will likely improve availability of vitamin D, thus encouraged Amber Parrish to continue with meal plan and their weight loss efforts to further improve this condition - pt's questions and concerns regarding this condition addressed. -Repeat lab in 3 months.  Start- Ergocalciferol 50K IU weekly; Disp #4; 0 RF  6. At risk for impaired function of liver Amber Parrish was given approximately 20 minutes of counseling today regarding prevention of impaired liver function. Amber Parrish was educated about her risk of developing NASH or even liver failure and advised that the only proven treatment for NAFLD was weight loss of at least 5-10% of body weight.   7. Obesity with current BMI of 41.7 Amber Parrish is currently in the action stage of change. As such, her goal is to continue with weight loss efforts. She has agreed to the Category 3 Plan.   Exercise goals:  As is  Behavioral modification strategies: planning for success.  Amber Parrish has agreed to follow-up with our clinic in 2-3 weeks. She was informed of the importance of frequent follow-up visits to maximize her success with intensive lifestyle modifications for her multiple health conditions.   Objective:   Blood pressure 132/79, pulse 69, temperature 97.9 F (36.6 C), height 5'  4" (1.626 m), weight 242 lb (109.8 kg), SpO2 98 %. Body mass index is 41.54 kg/m.  General: Cooperative, alert, well developed, in no acute distress. HEENT: Conjunctivae and lids unremarkable. Cardiovascular: Regular rhythm.  Lungs: Normal work of breathing. Neurologic: No focal deficits.   Lab Results  Component Value Date   CREATININE 0.66 04/24/2021   BUN 14 04/24/2021   NA 139 04/24/2021   K 4.5 04/24/2021   CL 102 04/24/2021   CO2 24 04/24/2021   Lab Results  Component Value Date   ALT 23 04/24/2021   AST 21 04/24/2021   ALKPHOS 77 04/24/2021   BILITOT 0.4 04/24/2021   Lab Results  Component Value Date   HGBA1C 5.7 (H) 04/24/2021   HGBA1C 5.7 (H) 11/15/2019   HGBA1C 5.7 (H) 07/03/2019   Lab Results  Component Value Date   INSULIN 13.3 04/24/2021   INSULIN 17.5 11/15/2019   INSULIN 16.7 07/03/2019   Lab Results  Component  Value Date   TSH 1.120 04/24/2021   Lab Results  Component Value Date   CHOL 190 04/24/2021   HDL 80 04/24/2021   LDLCALC 96 04/24/2021   TRIG 74 04/24/2021   CHOLHDL 3.4 11/15/2019   Lab Results  Component Value Date   VD25OH 31.0 04/24/2021   VD25OH 39.1 11/15/2019   VD25OH 28.0 (L) 07/03/2019   Lab Results  Component Value Date   WBC 4.4 04/24/2021   HGB 13.5 04/24/2021   HCT 41.5 04/24/2021   MCV 90 04/24/2021   PLT 270 04/24/2021    Attestation Statements:   Reviewed by clinician on day of visit: allergies, medications, problem list, medical history, surgical history, family history, social history, and previous encounter notes.  Edmund HildaI, Tamesha Frazier, CMA, am acting as transcriptionist for Marsh & McLennanDeborah Aria Jarrard, DO.  I have reviewed the above documentation for accuracy and completeness, and I agree with the above. Carlye Grippe-  Natalija Mavis J Rayssa Atha, D.O.  The 21st Century Cures Act was signed into law in 2016 which includes the topic of electronic health records.  This provides immediate access to information in MyChart.  This includes  consultation notes, operative notes, office notes, lab results and pathology reports.  If you have any questions about what you read please let us know at your next visit so we can discuss your concerns and take corrective action if need be.  We are right here with you.

## 2021-05-22 ENCOUNTER — Encounter (INDEPENDENT_AMBULATORY_CARE_PROVIDER_SITE_OTHER): Payer: Self-pay | Admitting: Family Medicine

## 2021-05-22 ENCOUNTER — Other Ambulatory Visit: Payer: Self-pay

## 2021-05-22 ENCOUNTER — Ambulatory Visit (INDEPENDENT_AMBULATORY_CARE_PROVIDER_SITE_OTHER): Payer: 59 | Admitting: Family Medicine

## 2021-05-22 VITALS — BP 112/77 | HR 67 | Temp 98.2°F | Ht 64.0 in | Wt 238.0 lb

## 2021-05-22 DIAGNOSIS — E559 Vitamin D deficiency, unspecified: Secondary | ICD-10-CM | POA: Diagnosis not present

## 2021-05-22 DIAGNOSIS — Z6841 Body Mass Index (BMI) 40.0 and over, adult: Secondary | ICD-10-CM | POA: Diagnosis not present

## 2021-05-22 DIAGNOSIS — E669 Obesity, unspecified: Secondary | ICD-10-CM | POA: Diagnosis not present

## 2021-05-22 MED ORDER — VITAMIN D (ERGOCALCIFEROL) 1.25 MG (50000 UNIT) PO CAPS: 50000.0000 [IU] | ORAL_CAPSULE | ORAL | 0 refills | Status: DC

## 2021-05-22 NOTE — Progress Notes (Signed)
Chief Complaint:   OBESITY Amber Parrish is here to discuss her progress with her obesity treatment plan along with follow-up of her obesity related diagnoses. Amber Parrish is on the Category 3 Plan and states she is following her eating plan approximately 80% of the time. Amber Parrish states she is walking for 35 minutes 2 times per week.  Today's visit was #: 3 Starting weight: 246 lbs Starting date: 04/24/2021 Today's weight: 238 lbs Today's date: 05/22/2021 Total lbs lost to date: 8 Total lbs lost since last in-office visit: 4  Interim History: Amber Parrish is here for a follow up office visit. We reviewed her meal plan and questions were answered. Patient's food recall appears to be accurate and consistent with what is on plan when she is following it. When eating on plan, her hunger and cravings are well controlled.  Amber Parrish made better choices and stuck to the plan more. She did a great job with avoiding temptations.  She is still a little bored with the meal plan.  She is unsure of the exact amount of water intake that she is getting in, but she thinks it is between 80-90 oz per day.    Subjective:   1. Vitamin D deficiency Amber Parrish is currently taking prescription vitamin D 50,000 IU each week. She denies nausea, vomiting or muscle weakness. I discussed labs with the patient today.  Assessment/Plan:   Medications Discontinued During This Encounter  Medication Reason   Vitamin D, Ergocalciferol, (DRISDOL) 1.25 MG (50000 UNIT) CAPS capsule Reorder     Meds ordered this encounter  Medications   Vitamin D, Ergocalciferol, (DRISDOL) 1.25 MG (50000 UNIT) CAPS capsule    Sig: Take 1 capsule (50,000 Units total) by mouth every 7 (seven) days.    Dispense:  4 capsule    Refill:  0     1. Vitamin D deficiency We will refill prescription Vitamin D for 1 month. Amber Parrish will follow-up for routine testing of Vitamin D, at least 2-3 times per year to avoid over-replacement.  - Refill  Vitamin D, Ergocalciferol, (DRISDOL) 1.25 MG (50000 UNIT) CAPS capsule; Take 1 capsule (50,000 Units total) by mouth every 7 (seven) days.  Dispense: 4 capsule; Refill: 0  2. Obesity with current BMI of 40.9 Amber Parrish is currently in the action stage of change. As such, her goal is to continue with weight loss efforts. She has agreed to the Category 3 Plan.   Recipes were given and we discussed healthy ways to make things she loves that maybe aren't so heathy.  Pt's Exercise goals: Walk at least 30 minutes 4 days per week.  Behavioral modification strategies: increasing lean protein intake, decreasing simple carbohydrates, meal planning and cooking strategies, and planning for success.  Amber Parrish has agreed to follow-up with our clinic in 2 weeks, and in 4 weeks. She was informed of the importance of frequent follow-up visits to maximize her success with intensive lifestyle modifications for her multiple health conditions.   Objective:   Blood pressure 112/77, pulse 67, temperature 98.2 F (36.8 C), height 5\' 4"  (1.626 m), weight 238 lb (108 kg), last menstrual period 05/15/2021, SpO2 98 %. Body mass index is 40.85 kg/m.  General: Cooperative, alert, well developed, in no acute distress. HEENT: Conjunctivae and lids unremarkable. Cardiovascular: Regular rhythm.  Lungs: Normal work of breathing. Neurologic: No focal deficits.   Lab Results  Component Value Date   CREATININE 0.66 04/24/2021   BUN 14 04/24/2021   NA 139 04/24/2021  K 4.5 04/24/2021   CL 102 04/24/2021   CO2 24 04/24/2021   Lab Results  Component Value Date   ALT 23 04/24/2021   AST 21 04/24/2021   ALKPHOS 77 04/24/2021   BILITOT 0.4 04/24/2021   Lab Results  Component Value Date   HGBA1C 5.7 (H) 04/24/2021   HGBA1C 5.7 (H) 11/15/2019   HGBA1C 5.7 (H) 07/03/2019   Lab Results  Component Value Date   INSULIN 13.3 04/24/2021   INSULIN 17.5 11/15/2019   INSULIN 16.7 07/03/2019   Lab Results  Component  Value Date   TSH 1.120 04/24/2021   Lab Results  Component Value Date   CHOL 190 04/24/2021   HDL 80 04/24/2021   LDLCALC 96 04/24/2021   TRIG 74 04/24/2021   CHOLHDL 3.4 11/15/2019   Lab Results  Component Value Date   VD25OH 31.0 04/24/2021   VD25OH 39.1 11/15/2019   VD25OH 28.0 (L) 07/03/2019   Lab Results  Component Value Date   WBC 4.4 04/24/2021   HGB 13.5 04/24/2021   HCT 41.5 04/24/2021   MCV 90 04/24/2021   PLT 270 04/24/2021   No results found for: IRON, TIBC, FERRITIN  Attestation Statements:   Reviewed by clinician on day of visit: allergies, medications, problem list, medical history, surgical history, family history, social history, and previous encounter notes.  Trude Mcburney, am acting as transcriptionist for [employer], DO.  I have reviewed the above documentation for accuracy and completeness, and I agree with the above. Carlye Grippe, D.O.  The 21st Century Cures Act was signed into law in 2016 which includes the topic of electronic health records.  This provides immediate access to information in MyChart.  This includes consultation notes, operative notes, office notes, lab results and pathology reports.  If you have any questions about what you read please let us know at your next visit so we can discuss your concerns and take corrective action if need be.  We are right here with you.

## 2021-06-04 ENCOUNTER — Ambulatory Visit: Payer: 59 | Admitting: Neurology

## 2021-06-05 ENCOUNTER — Other Ambulatory Visit: Payer: Self-pay

## 2021-06-05 ENCOUNTER — Encounter (INDEPENDENT_AMBULATORY_CARE_PROVIDER_SITE_OTHER): Payer: Self-pay | Admitting: Adult Health

## 2021-06-05 ENCOUNTER — Ambulatory Visit (INDEPENDENT_AMBULATORY_CARE_PROVIDER_SITE_OTHER): Payer: 59 | Admitting: Adult Health

## 2021-06-05 ENCOUNTER — Encounter: Payer: Self-pay | Admitting: Neurology

## 2021-06-05 VITALS — BP 145/73 | HR 84 | Temp 98.2°F | Ht 64.0 in | Wt 236.0 lb

## 2021-06-05 DIAGNOSIS — G8929 Other chronic pain: Secondary | ICD-10-CM | POA: Diagnosis not present

## 2021-06-05 DIAGNOSIS — E559 Vitamin D deficiency, unspecified: Secondary | ICD-10-CM

## 2021-06-05 DIAGNOSIS — Z6841 Body Mass Index (BMI) 40.0 and over, adult: Secondary | ICD-10-CM

## 2021-06-05 DIAGNOSIS — R519 Headache, unspecified: Secondary | ICD-10-CM

## 2021-06-05 DIAGNOSIS — R030 Elevated blood-pressure reading, without diagnosis of hypertension: Secondary | ICD-10-CM | POA: Diagnosis not present

## 2021-06-05 DIAGNOSIS — E669 Obesity, unspecified: Secondary | ICD-10-CM

## 2021-06-05 NOTE — Progress Notes (Signed)
Chief Complaint:   OBESITY Amber Parrish is here to discuss her progress with her obesity treatment plan along with follow-up of her obesity related diagnoses. Amber Parrish is on the Category 3 Plan and states she is following her eating plan approximately 80-85% of the time. Amber Parrish states she is walking for 30-40 minutes 4 times per week.  Today's visit was #: 4 Starting weight: 246 lbs Starting date: 04/24/2021 Today's weight: 236 lbs Today's date: 06/05/2021 Total lbs lost to date: 10 lbs Total lbs lost since last in-office visit: 2 lbs  Interim History:  First time in [facility] 2021-2022. Father passed [id number] - complications of bladder cancer. 2022 - focused on assisting her mother during her grieving period. Her parents were together >42 years. She resumed the program [facility] on 04/24/2021.  Subjective:   1. Vitamin D deficiency She was restarted on ergocalciferol on 05/08/2021.  2. Elevated BP without diagnosis of hypertension Increased BP at office visit. She has never been on antihypertensive medication. She denies acute cardiac symptoms. She denies tobacco/vape use/  3. Chronic nonintractable headache, unspecified headache type She is on Trokendi XR 50mg  QHS- managed by Neurology/Dr. Tomi Likens. She reports reduction in severity of HA sx's, however still continues to experience daily HA pain.  Assessment/Plan:   1. Vitamin D deficiency Continue ergocalciferol.  2. Elevated BP without diagnosis of hypertension Decrease sodium, continue walking, monitor BP closely.  3. Chronic nonintractable headache, unspecified headache type Remain well hydrated with water. Send MyChart message to Dr. Tomi Likens about frequency of symptoms.  4. Obesity with current BMI of 40.6  Amber Parrish is currently in the action stage of change. As such, her goal is to continue with weight loss efforts. She has agreed to the Category 3 Plan.    Cal/protein for lunch/dinner provided to assist when she eats off  plan.  Exercise goals:  As is.  Behavioral modification strategies: increasing lean protein intake, decreasing simple carbohydrates, meal planning and cooking strategies, keeping healthy foods in the home, and planning for success.  Amber Parrish has agreed to follow-up with our clinic in 3 weeks. She was informed of the importance of frequent follow-up visits to maximize her success with intensive lifestyle modifications for her multiple health conditions.   Objective:   Blood pressure (!) 145/73, pulse 84, temperature 98.2 F (36.8 C), height 5\' 4"  (1.626 m), weight 236 lb (107 kg), last menstrual period 05/15/2021, SpO2 98 %. Body mass index is 40.51 kg/m.  General: Cooperative, alert, well developed, in no acute distress. HEENT: Conjunctivae and lids unremarkable. Cardiovascular: Regular rhythm.  Lungs: Normal work of breathing. Neurologic: No focal deficits.   Lab Results  Component Value Date   CREATININE 0.66 04/24/2021   BUN 14 04/24/2021   NA 139 04/24/2021   K 4.5 04/24/2021   CL 102 04/24/2021   CO2 24 04/24/2021   Lab Results  Component Value Date   ALT 23 04/24/2021   AST 21 04/24/2021   ALKPHOS 77 04/24/2021   BILITOT 0.4 04/24/2021   Lab Results  Component Value Date   HGBA1C 5.7 (H) 04/24/2021   HGBA1C 5.7 (H) 11/15/2019   HGBA1C 5.7 (H) 07/03/2019   Lab Results  Component Value Date   INSULIN 13.3 04/24/2021   INSULIN 17.5 11/15/2019   INSULIN 16.7 07/03/2019   Lab Results  Component Value Date   TSH 1.120 04/24/2021   Lab Results  Component Value Date   CHOL 190 04/24/2021   HDL 80 04/24/2021   LDLCALC 96  04/24/2021   TRIG 74 04/24/2021   CHOLHDL 3.4 11/15/2019   Lab Results  Component Value Date   VD25OH 31.0 04/24/2021   VD25OH 39.1 11/15/2019   VD25OH 28.0 (L) 07/03/2019   Lab Results  Component Value Date   WBC 4.4 04/24/2021   HGB 13.5 04/24/2021   HCT 41.5 04/24/2021   MCV 90 04/24/2021   PLT 270 04/24/2021   Attestation  Statements:   Reviewed by clinician on day of visit: allergies, medications, problem list, medical history, surgical history, family history, social history, and previous encounter notes.  Time spent on visit including pre-visit chart review and post-visit care and charting was 30 minutes.   I, Water quality scientist, CMA, am acting as Location manager for Mina Marble, NP.  I have reviewed the above documentation for accuracy and completeness, and I agree with the above. -  Lazaro Isenhower d. Gayla Benn, NP-C

## 2021-06-06 ENCOUNTER — Other Ambulatory Visit: Payer: Self-pay

## 2021-06-06 MED ORDER — TOPIRAMATE ER 100 MG PO CAP24: 100.0000 mg | ORAL_CAPSULE | Freq: Every day | ORAL | 5 refills | Status: DC

## 2021-06-25 ENCOUNTER — Other Ambulatory Visit: Payer: Self-pay

## 2021-06-25 DIAGNOSIS — G4486 Cervicogenic headache: Secondary | ICD-10-CM

## 2021-06-25 NOTE — Progress Notes (Signed)
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  However, if you find out there is a  prolonged wait, we can always refer to physical therapy. ?

## 2021-06-26 ENCOUNTER — Ambulatory Visit (INDEPENDENT_AMBULATORY_CARE_PROVIDER_SITE_OTHER): Payer: 59 | Admitting: Family Medicine

## 2021-06-26 ENCOUNTER — Other Ambulatory Visit: Payer: Self-pay

## 2021-06-26 ENCOUNTER — Encounter (INDEPENDENT_AMBULATORY_CARE_PROVIDER_SITE_OTHER): Payer: Self-pay | Admitting: Family Medicine

## 2021-06-26 VITALS — BP 126/76 | HR 64 | Temp 97.6°F | Ht 64.0 in | Wt 233.0 lb

## 2021-06-26 DIAGNOSIS — R519 Headache, unspecified: Secondary | ICD-10-CM | POA: Diagnosis not present

## 2021-06-26 DIAGNOSIS — E669 Obesity, unspecified: Secondary | ICD-10-CM | POA: Diagnosis not present

## 2021-06-26 DIAGNOSIS — Z6841 Body Mass Index (BMI) 40.0 and over, adult: Secondary | ICD-10-CM

## 2021-06-26 DIAGNOSIS — E559 Vitamin D deficiency, unspecified: Secondary | ICD-10-CM

## 2021-06-26 DIAGNOSIS — G8929 Other chronic pain: Secondary | ICD-10-CM | POA: Diagnosis not present

## 2021-06-26 DIAGNOSIS — Z9189 Other specified personal risk factors, not elsewhere classified: Secondary | ICD-10-CM

## 2021-06-26 MED ORDER — VITAMIN D (ERGOCALCIFEROL) 1.25 MG (50000 UNIT) PO CAPS: 50000.0000 [IU] | ORAL_CAPSULE | ORAL | 0 refills | Status: DC

## 2021-06-26 NOTE — Progress Notes (Addendum)
? ? Aleen Sells D.Judd Gaudier ?[facility] ?[address] [place] [address] ?Phone: [phone] ?  ?Assessment and Plan:   ?  ?1. Chronic nonintractable headache, unspecified headache type ?2. Neck pain ?3. Somatic dysfunction of cervical region ?4. Somatic dysfunction of thoracic region ?5. Somatic dysfunction of rib region ?-Chronic with exacerbation, initial sports medicine ?- Unclear etiology of chronic headaches.  Patient presents today for evaluation of potential cervicogenic cause.  While there may be a contributing factor from C-spine, it does not appear to be primary reason with patient having unremarkable C-spine x-ray, normal neck physical examination ?- Patient elected to trial OMT today.  Tolerated well per note below.  Suboccipital release provided most immediate relief ?- Decision today to treat with OMT was based on Physical Exam ?-X-ray obtained in clinic due to chronic nature of pain.  Medical rotation: No acute fracture or vertebral collapse.  Mild decreased lordosis of cervical spine which can be seen with muscular contractions.  Mild cortical vertebral changes ? ?After verbal consent patient was treated with HVLA (high velocity low amplitude), ME (muscle energy), FPR (flex positional release), ST (soft tissue), PC/PD (Pelvic Compression/ Pelvic Decompression) techniques in cervical, rib, thoracic, areas. Patient tolerated the procedure well with improvement in symptoms.  Patient educated on potential side effects of soreness and recommended to rest, hydrate, and use Tylenol as needed for pain control. ?  ?Pertinent previous records reviewed include MRI brain 03/04/2021, MRI MRV head 02/26/2021, neurology note 04/29/2021 ?  ?Follow Up: As needed in 2 to 3 weeks if patient found benefit from OMT, at which point we could repeat ?  ?Subjective:   ?I, Jerene Canny, am serving as a Neurosurgeon for Doctor [employer] ? ?Chief Complaint: neck pain  ? ?HPI:   ?06/27/2021 ?Patient is a 51 year old female complaining of neck pain. Patient states 04/29/2021 with dr Everlena Cooper She began having headaches around the summer 2022.   It is a unilateral pressure occipital headache (either side) that begins as 2-3/10 in the morning and gradually spreads to the forehead at a 6-7/10 intensity late in the day over the last 3-4 months .  Sometimes neck pain by end of day.  She has photosensitivity but denies nausea, vomiting, phonophobia, autonomic symptoms, dizziness, numbness or weakness.  They occur daily.  Bright light and intense smells are triggers.  Rest and hydration help relieve it.  MRI and MRV of brain with and without contrast in November personally reviewed were unremarkable.  By late November, she started noticing blurred vision whenever concentrating/reading or using computer but denies visual obscurations.  She has not recently had an eye exam.  When headaches are more severe, she may hear a whooshing in her head.  She tried treating with ibuprofen but since ineffective, she has not taken any analgesics. Is open to discussing OMT , trigger point injections  ? ?Relevant Historical Information: GERD, Chronic headaches ? ?Additional pertinent review of systems negative. ? ? ?Current Outpatient Medications:  ?  tiZANidine (ZANAFLEX) 2 MG tablet, Take 1 tablet three times daily as needed., Disp: 90 tablet, Rfl: 5 ?  Topiramate ER (TROKENDI XR) 100 MG CP24, Take 100 mg by mouth daily at 2 PM., Disp: 30 capsule, Rfl: 5 ?  Vitamin D, Ergocalciferol, (DRISDOL) 1.25 MG (50000 UNIT) CAPS capsule, Take 1 capsule (50,000 Units total) by mouth every 7 (seven) days., Disp: 4 capsule, Rfl: 0  ? ?Objective:   ?  ?Vitals:  ? 06/27/21 0800  ?  BP: 122/80  ?Pulse: 73  ?SpO2: 98%  ?Weight: 233 lb (105.7 kg)  ?Height: 5\' 4"  (1.626 m)  ?  ?  ?Body mass index is 39.99 kg/m?.  ?  ?Physical Exam:   ? ?General: Well-appearing, cooperative, sitting comfortably in no acute distress.  ? ?OMT Physical  Exam: ?  ?Cervical: Mild TTP paraspinal, C3 RR SL ?Rib: Bilateral elevated first rib with TTP ?Thoracic: TTP paraspinal, T4-7 RRSL ?  ? ?Cervical Spine: Posture normal ?Skin: normal, intact ? ?Sensation: intact to light touch in upper extremities bilaterally ? ?Spurling's:  negative bilaterally ?Neck ROM: Full active ROM ?  ? ? ?Electronically signed by:  ? D.Aleen Sells ?[facility] ?8:46 AM 06/27/21 ?

## 2021-06-27 ENCOUNTER — Ambulatory Visit: Payer: 59 | Admitting: Sports Medicine

## 2021-06-27 ENCOUNTER — Ambulatory Visit (INDEPENDENT_AMBULATORY_CARE_PROVIDER_SITE_OTHER): Payer: 59

## 2021-06-27 VITALS — BP 122/80 | HR 73 | Ht 64.0 in | Wt 233.0 lb

## 2021-06-27 DIAGNOSIS — G8929 Other chronic pain: Secondary | ICD-10-CM

## 2021-06-27 DIAGNOSIS — M9908 Segmental and somatic dysfunction of rib cage: Secondary | ICD-10-CM

## 2021-06-27 DIAGNOSIS — R519 Headache, unspecified: Secondary | ICD-10-CM

## 2021-06-27 DIAGNOSIS — M9901 Segmental and somatic dysfunction of cervical region: Secondary | ICD-10-CM

## 2021-06-27 DIAGNOSIS — M9902 Segmental and somatic dysfunction of thoracic region: Secondary | ICD-10-CM

## 2021-06-27 DIAGNOSIS — M542 Cervicalgia: Secondary | ICD-10-CM

## 2021-06-27 NOTE — Patient Instructions (Addendum)
Good to see you  ?Xray on the way out ?As needed follow up 2-3 week follow up if you saw improvement from OMT  ?

## 2021-06-29 NOTE — Progress Notes (Signed)
Chief Complaint:   OBESITY Amber Parrish is here to discuss her progress with her obesity treatment plan along with follow-up of her obesity related diagnoses. Amber Parrish is on the Category 3 Plan and states she is following her eating plan approximately 60% of the time. Amber Parrish states she is doing 0 minutes 0 times per week.  Today's visit was #: 5 Starting weight: 246 lbs Starting date: 04/24/2021 Today's weight: 233 lbs Today's date: 06/26/2021 Total lbs lost to date: 13 Total lbs lost since last in-office visit: 3  Interim History: Amber Parrish is skipping meals/food due to medications side effects. She has been dealing with terrible headache and unable to do much of anything but work.    Subjective:   1. Chronic nonintractable headache, unspecified headache type Amber Parrish notes her headaches are getting worse. She is treated by Dr. Everlena Cooper. They increased her Trokendi XR. She notes everything tastes like metal. She sees Amber Parrish tomorrow.   2. Vitamin D deficiency Amber Parrish is currently taking prescription vitamin D 50,000 IU each week. She denies nausea, vomiting or muscle weakness.  3. At risk for deficient intake of food Amber Parrish is at a higher than average risk of deficient intake of food due to skipping meals.    Assessment/Plan:   Medications Discontinued During This Encounter  Medication Reason   Vitamin D, Ergocalciferol, (DRISDOL) 1.25 MG (50000 UNIT) CAPS capsule Reorder     Meds ordered this encounter  Medications   Vitamin D, Ergocalciferol, (DRISDOL) 1.25 MG (50000 UNIT) CAPS capsule    Sig: Take 1 capsule (50,000 Units total) by mouth every 7 (seven) days.    Dispense:  4 capsule    Refill:  0     1. Chronic nonintractable headache, unspecified headache type [name] will continue her care plan per Dr. Everlena Cooper, Neurologist and sports medicine. I explained to the patient why sports medicine would be useful and recommended that she sees Amber Parrish.  2. Vitamin D  deficiency We will refill prescription Vitamin D 50,000 IU every week for 1 month. [name] will follow-up for routine testing of Vitamin D, at least 2-3 times per year to avoid over-replacement.  - Vitamin D, Ergocalciferol, (DRISDOL) 1.25 MG (50000 UNIT) CAPS capsule; Take 1 capsule (50,000 Units total) by mouth every 7 (seven) days.  Dispense: 4 capsule; Refill: 0  3. At risk for deficient intake of food Amber Parrish was given approximately 9 minutes of deficient intake of food prevention counseling today. Amber Parrish is at risk for eating too few calories based on current food recall. She was encouraged to focus on meeting caloric and protein goals according to her recommended meal plan.  4. Obesity with current BMI of 40.1 Amber Parrish is currently in the action stage of change. As such, her goal is to continue with weight loss efforts. She has agreed to the Category 3 Plan.   Exercise goals: As is.  Behavioral modification strategies: increasing lean protein intake and no skipping meals.  Amber Parrish has agreed to follow-up with our clinic in 3 weeks. She was informed of the importance of frequent follow-up visits to maximize her success with intensive lifestyle modifications for her multiple health conditions.     Objective:   Blood pressure 126/76, pulse 64, temperature 97.6 F (36.4 C), temperature source Oral, height 5\' 4"  (1.626 m), weight 233 lb (105.7 kg), last menstrual period 05/30/2021, SpO2 98 %. Body mass index is 39.99 kg/m.  General: Cooperative, alert, well developed, in no acute distress. HEENT: Conjunctivae and lids  unremarkable. Cardiovascular: Regular rhythm.  Lungs: Normal work of breathing. Neurologic: No focal deficits.   Lab Results  Component Value Date   CREATININE 0.66 04/24/2021   BUN 14 04/24/2021   NA 139 04/24/2021   K 4.5 04/24/2021   CL 102 04/24/2021   CO2 24 04/24/2021   Lab Results  Component Value Date   ALT 23 04/24/2021   AST 21 04/24/2021    ALKPHOS 77 04/24/2021   BILITOT 0.4 04/24/2021   Lab Results  Component Value Date   HGBA1C 5.7 (H) 04/24/2021   HGBA1C 5.7 (H) 11/15/2019   HGBA1C 5.7 (H) 07/03/2019   Lab Results  Component Value Date   INSULIN 13.3 04/24/2021   INSULIN 17.5 11/15/2019   INSULIN 16.7 07/03/2019   Lab Results  Component Value Date   TSH 1.120 04/24/2021   Lab Results  Component Value Date   CHOL 190 04/24/2021   HDL 80 04/24/2021   LDLCALC 96 04/24/2021   TRIG 74 04/24/2021   CHOLHDL 3.4 11/15/2019   Lab Results  Component Value Date   VD25OH 31.0 04/24/2021   VD25OH 39.1 11/15/2019   VD25OH 28.0 (L) 07/03/2019   Lab Results  Component Value Date   WBC 4.4 04/24/2021   HGB 13.5 04/24/2021   HCT 41.5 04/24/2021   MCV 90 04/24/2021   PLT 270 04/24/2021   No results found for: IRON, TIBC, FERRITIN  Attestation Statements:   Reviewed by clinician on day of visit: allergies, medications, problem list, medical history, surgical history, family history, social history, and previous encounter notes.   Trude Mcburney, am acting as transcriptionist for [employer], DO.  I have reviewed the above documentation for accuracy and completeness, and I agree with the above. Carlye Grippe, D.O.  The 21st Century Cures Act was signed into law in 2016 which includes the topic of electronic health records.  This provides immediate access to information in MyChart.  This includes consultation notes, operative notes, office notes, lab results and pathology reports.  If you have any questions about what you read please let us know at your next visit so we can discuss your concerns and take corrective action if need be.  We are right here with you.

## 2021-07-17 ENCOUNTER — Ambulatory Visit (INDEPENDENT_AMBULATORY_CARE_PROVIDER_SITE_OTHER): Payer: 59 | Admitting: Family Medicine

## 2021-07-17 ENCOUNTER — Encounter (INDEPENDENT_AMBULATORY_CARE_PROVIDER_SITE_OTHER): Payer: Self-pay | Admitting: Family Medicine

## 2021-07-17 VITALS — BP 115/79 | HR 66 | Temp 98.1°F | Ht 64.0 in | Wt 234.0 lb

## 2021-07-17 DIAGNOSIS — E669 Obesity, unspecified: Secondary | ICD-10-CM | POA: Diagnosis not present

## 2021-07-17 DIAGNOSIS — Z9189 Other specified personal risk factors, not elsewhere classified: Secondary | ICD-10-CM | POA: Diagnosis not present

## 2021-07-17 DIAGNOSIS — Z6841 Body Mass Index (BMI) 40.0 and over, adult: Secondary | ICD-10-CM

## 2021-07-17 DIAGNOSIS — E559 Vitamin D deficiency, unspecified: Secondary | ICD-10-CM

## 2021-07-23 NOTE — Progress Notes (Signed)
? ? ? ?Chief Complaint:  ? ?OBESITY ?Sherrill is here to discuss her progress with her obesity treatment plan along with follow-up of her obesity related diagnoses. Karlei is on the Category 3 Plan and states she is following her eating plan approximately 70% of the time. Theresea states she is walking for 30-40 minutes 3-4 times per week. ? ?Today's visit was #: 6 ?Starting weight: 246 lbs ?Starting date: 04/24/2021 ?Today's weight: 234 lbs ?Today's date: 07/17/2021 ?Total lbs lost to date: 12 ?Total lbs lost since last in-office visit: 0 ? ?Interim History: Stephanny is still with metallic taste in her mouth and nothing tastes good. She is skipping meals/foods and gained 1 lb since her last office visit. ? ?Subjective:  ? ?1. Vitamin D deficiency ?Fransico Him is tolerating medication(s) well without side effects.  Medication compliance is good as patient endorses taking it as prescribed.  The patient denies additional concerns regarding this condition.     ? ?2. At risk for deficient intake of food ?The patient is at a higher than average risk of deficient intake of food due to inadequate intake. ? ?Assessment/Plan:  ?No orders of the defined types were placed in this encounter. ? ? ?There are no discontinued medications.  ? ?No orders of the defined types were placed in this encounter. ?  ? ?1. Vitamin D deficiency ?Edwinna will continue prescription Vitamin D 50,000 IU every week, no need for a refill. She will follow-up for routine testing of Vitamin D, at least 2-3 times per year to avoid over-replacement. ? ?2. At risk for deficient intake of food ?Kalla was given approximately 9 minutes of deficient intake of food prevention counseling today. Soumya is at risk for eating too few calories based on current food recall. She was encouraged to focus on meeting caloric and protein goals according to her recommended meal plan. ? ?3. Obesity with current BMI of 40.2 ?Tyreshia is currently in the action stage of  change. As such, her goal is to continue with weight loss efforts. She has agreed to the Category 3 Plan.  ? ?Strategies for better plan adherence was discussed with the patient today. ? ?Exercise goals: As is, increase as tolerated.  ? ?Behavioral modification strategies: no skipping meals and planning for success. ? ?Mattingly has agreed to follow-up with our clinic in 3 to 4 weeks. She was informed of the importance of frequent follow-up visits to maximize her success with intensive lifestyle modifications for her multiple health conditions.  ? ?Objective:  ? ?Blood pressure 115/79, pulse 66, temperature 98.1 ?F (36.7 ?C), height 5\' 4"  (1.626 m), weight 234 lb (106.1 kg), SpO2 99 %. ?Body mass index is 40.17 kg/m?. ? ?General: Cooperative, alert, well developed, in no acute distress. ?HEENT: Conjunctivae and lids unremarkable. ?Cardiovascular: Regular rhythm.  ?Lungs: Normal work of breathing. ?Neurologic: No focal deficits.  ? ?Lab Results  ?Component Value Date  ? CREATININE 0.66 04/24/2021  ? BUN 14 04/24/2021  ? NA 139 04/24/2021  ? K 4.5 04/24/2021  ? CL 102 04/24/2021  ? CO2 24 04/24/2021  ? ?Lab Results  ?Component Value Date  ? ALT 23 04/24/2021  ? AST 21 04/24/2021  ? ALKPHOS 77 04/24/2021  ? BILITOT 0.4 04/24/2021  ? ?Lab Results  ?Component Value Date  ? HGBA1C 5.7 (H) 04/24/2021  ? HGBA1C 5.7 (H) 11/15/2019  ? HGBA1C 5.7 (H) 07/03/2019  ? ?Lab Results  ?Component Value Date  ? INSULIN 13.3 04/24/2021  ? INSULIN 17.5 11/15/2019  ?  INSULIN 16.7 07/03/2019  ? ?Lab Results  ?Component Value Date  ? TSH 1.120 04/24/2021  ? ?Lab Results  ?Component Value Date  ? CHOL 190 04/24/2021  ? HDL 80 04/24/2021  ? LDLCALC 96 04/24/2021  ? TRIG 74 04/24/2021  ? CHOLHDL 3.4 11/15/2019  ? ?Lab Results  ?Component Value Date  ? VD25OH 31.0 04/24/2021  ? VD25OH 39.1 11/15/2019  ? VD25OH 28.0 (L) 07/03/2019  ? ?Lab Results  ?Component Value Date  ? WBC 4.4 04/24/2021  ? HGB 13.5 04/24/2021  ? HCT 41.5 04/24/2021  ? MCV 90  04/24/2021  ? PLT 270 04/24/2021  ? ?No results found for: IRON, TIBC, FERRITIN ? ?Attestation Statements:  ? ?Reviewed by clinician on day of visit: allergies, medications, problem list, medical history, surgical history, family history, social history, and previous encounter notes. ? ? ?I, Burt Knack, am acting as transcriptionist for [employer], DO. ? ?I have reviewed the above documentation for accuracy and completeness, and I agree with the above. Carlye Grippe, D.O. ? ?The 21st Century Cures Act was signed into law in 2016 which includes the topic of electronic health records.  This provides immediate access to information in MyChart.  This includes consultation notes, operative notes, office notes, lab results and pathology reports.  If you have any questions about what you read please let us know at your next visit so we can discuss your concerns and take corrective action if need be.  We are right here with you. ? ? ?

## 2021-07-31 ENCOUNTER — Other Ambulatory Visit: Payer: Self-pay | Admitting: Neurology

## 2021-08-07 ENCOUNTER — Telehealth (INDEPENDENT_AMBULATORY_CARE_PROVIDER_SITE_OTHER): Payer: 59 | Admitting: Family Medicine

## 2021-08-07 ENCOUNTER — Encounter (INDEPENDENT_AMBULATORY_CARE_PROVIDER_SITE_OTHER): Payer: Self-pay | Admitting: Family Medicine

## 2021-08-07 DIAGNOSIS — E559 Vitamin D deficiency, unspecified: Secondary | ICD-10-CM

## 2021-08-07 DIAGNOSIS — F39 Unspecified mood [affective] disorder: Secondary | ICD-10-CM

## 2021-08-07 DIAGNOSIS — K529 Noninfective gastroenteritis and colitis, unspecified: Secondary | ICD-10-CM | POA: Diagnosis not present

## 2021-08-07 DIAGNOSIS — Z6841 Body Mass Index (BMI) 40.0 and over, adult: Secondary | ICD-10-CM

## 2021-08-07 DIAGNOSIS — E669 Obesity, unspecified: Secondary | ICD-10-CM | POA: Diagnosis not present

## 2021-08-07 MED ORDER — VITAMIN D (ERGOCALCIFEROL) 1.25 MG (50000 UNIT) PO CAPS: 50000.0000 [IU] | ORAL_CAPSULE | ORAL | 0 refills | Status: DC

## 2021-08-11 ENCOUNTER — Ambulatory Visit (INDEPENDENT_AMBULATORY_CARE_PROVIDER_SITE_OTHER): Payer: 59 | Admitting: Family Medicine

## 2021-08-25 NOTE — Progress Notes (Signed)
? ? ?TeleHealth Visit:  ?Due to the COVID-19 pandemic, this visit was completed with telemedicine (audio/video) technology to reduce patient and provider exposure as well as to preserve personal protective equipment.  ? ?Amber Parrish has verbally consented to this TeleHealth visit. The patient is located at home, the provider is located at the [facility] office. The participants in this visit include the listed provider and patient. The visit was conducted today via MyChart video. ? ? ?Chief Complaint: OBESITY ?Amber Parrish is here to discuss her progress with her obesity treatment plan along with follow-up of her obesity related diagnoses. Amber Parrish is on the Category 3 Plan and states she is following her eating plan approximately 80% of the time. Amber Parrish states she is doing 0 minutes 0 times per week. ? ?Today's visit was #: 7 ?Starting weight: 246 lbs ?Starting date: 04/24/2021 ? ?Interim History: Amber Parrish is getting used to the metallic taste and how food aren't as appealing. No issues with the plan. No concerns. Dealing with GI issues, getting better now though. Still not eating all of the food on the plan. ? ?Subjective:  ? ?1. Vitamin D deficiency ?Amber Parrish's compliance is good.  ? ?2. Gastroenteritis ?PT having GI upset for a couple of days. Some loose stools, didn't want to share it. No abdominal pain or F/C improving.  ? ?3. Mood disorder (HCC), with emotional eating ?Less stress at work now. Calming down a bit. No emotional eating persay. Headaches pretty well controlled.  ? ?Assessment/Plan:  ?No orders of the defined types were placed in this encounter. ? ? ?Medications Discontinued During This Encounter  ?Medication Reason  ? Vitamin D, Ergocalciferol, (DRISDOL) 1.25 MG (50000 UNIT) CAPS capsule Reorder  ?  ? ?Meds ordered this encounter  ?Medications  ? Vitamin D, Ergocalciferol, (DRISDOL) 1.25 MG (50000 UNIT) CAPS capsule  ?  Sig: Take 1 capsule (50,000 Units total) by mouth every 7 (seven) days.   ?  Dispense:  4 capsule  ?  Refill:  0  ?  ? ?1. Vitamin D deficiency ?Low Vitamin D level contributes to fatigue and are associated with obesity, breast, and colon cancer. Refill prescription Vitamin D 50,000 IU every week for 1 month. Amber Parrish will follow-up for routine testing of Vitamin D, at least 2-3 times per year to avoid over-replacement. ? ?- Vitamin D, Ergocalciferol, (DRISDOL) 1.25 MG (50000 UNIT) CAPS capsule; Take 1 capsule (50,000 Units total) by mouth every 7 (seven) days.  Dispense: 4 capsule; Refill: 0 ? ?2. Gastroenteritis ?Decrease fatty, fried, and spicy food. Avoid dairy until GI symptoms resolve. ? ?3. Mood disorder (HCC), with emotional eating ?Stress management strategies were discussed with the PT and making family time to enjoy activities together.  ? ?4. Class 3 severe obesity with serious comorbidity and body mass index (BMI) of 40.0 to 44.9 in adult, unspecified obesity type (HCC) ?Amber Parrish is currently in the action stage of change. As such, her goal is to continue with weight loss efforts. She has agreed to the Category 3 Plan.  ? ?Exercise goals: All adults should avoid inactivity. Some physical activity is better than none, and adults who participate in any amount of physical activity gain some health benefits. PT will increase exercise to 2 days per week. ? ?Behavioral modification strategies: increasing lean protein intake, no skipping meals, and keeping healthy foods in the home. ? ?Amber Parrish has agreed to follow-up with our clinic in 3 weeks. She was informed of the importance of frequent follow-up visits to maximize  her success with intensive lifestyle modifications for her multiple health conditions. ? ?Objective:  ? ?VITALS: Per patient if applicable, see vitals. ?GENERAL: Alert and in no acute distress. ?CARDIOPULMONARY: No increased WOB. Speaking in clear sentences.  ?PSYCH: Pleasant and cooperative. Speech normal rate and rhythm. Affect is appropriate. Insight and judgement  are appropriate. Attention is focused, linear, and appropriate.  ?NEURO: Oriented as arrived to appointment on time with no prompting.  ? ?Lab Results  ?Component Value Date  ? CREATININE 0.66 04/24/2021  ? BUN 14 04/24/2021  ? NA 139 04/24/2021  ? K 4.5 04/24/2021  ? CL 102 04/24/2021  ? CO2 24 04/24/2021  ? ?Lab Results  ?Component Value Date  ? ALT 23 04/24/2021  ? AST 21 04/24/2021  ? ALKPHOS 77 04/24/2021  ? BILITOT 0.4 04/24/2021  ? ?Lab Results  ?Component Value Date  ? HGBA1C 5.7 (H) 04/24/2021  ? HGBA1C 5.7 (H) 11/15/2019  ? HGBA1C 5.7 (H) 07/03/2019  ? ?Lab Results  ?Component Value Date  ? INSULIN 13.3 04/24/2021  ? INSULIN 17.5 11/15/2019  ? INSULIN 16.7 07/03/2019  ? ?Lab Results  ?Component Value Date  ? TSH 1.120 04/24/2021  ? ?Lab Results  ?Component Value Date  ? CHOL 190 04/24/2021  ? HDL 80 04/24/2021  ? LDLCALC 96 04/24/2021  ? TRIG 74 04/24/2021  ? CHOLHDL 3.4 11/15/2019  ? ?Lab Results  ?Component Value Date  ? VD25OH 31.0 04/24/2021  ? VD25OH 39.1 11/15/2019  ? VD25OH 28.0 (L) 07/03/2019  ? ?Lab Results  ?Component Value Date  ? WBC 4.4 04/24/2021  ? HGB 13.5 04/24/2021  ? HCT 41.5 04/24/2021  ? MCV 90 04/24/2021  ? PLT 270 04/24/2021  ? ?No results found for: IRON, TIBC, FERRITIN ? ?Attestation Statements:  ? ?Reviewed by clinician on day of visit: allergies, medications, problem list, medical history, surgical history, family history, social history, and previous encounter notes. ? ? ?I, Burt Knack, am acting as transcriptionist for [employer], DO. ? ?I have reviewed the above documentation for accuracy and completeness, and I agree with the above. Carlye Grippe, D.O. ? ?The 21st Century Cures Act was signed into law in 2016 which includes the topic of electronic health records.  This provides immediate access to information in MyChart.  This includes consultation notes, operative notes, office notes, lab results and pathology reports.  If you have any questions about what you  read please let us know at your next visit so we can discuss your concerns and take corrective action if need be.  We are right here with you. ? ? ?

## 2021-08-28 ENCOUNTER — Encounter (INDEPENDENT_AMBULATORY_CARE_PROVIDER_SITE_OTHER): Payer: Self-pay | Admitting: Family Medicine

## 2021-08-28 ENCOUNTER — Ambulatory Visit (INDEPENDENT_AMBULATORY_CARE_PROVIDER_SITE_OTHER): Payer: 59 | Admitting: Family Medicine

## 2021-08-28 VITALS — BP 122/82 | HR 79 | Temp 98.5°F | Ht 64.0 in | Wt 231.0 lb

## 2021-08-28 DIAGNOSIS — E669 Obesity, unspecified: Secondary | ICD-10-CM

## 2021-08-28 DIAGNOSIS — E559 Vitamin D deficiency, unspecified: Secondary | ICD-10-CM | POA: Diagnosis not present

## 2021-08-28 DIAGNOSIS — Z9189 Other specified personal risk factors, not elsewhere classified: Secondary | ICD-10-CM

## 2021-08-28 DIAGNOSIS — Z6839 Body mass index (BMI) 39.0-39.9, adult: Secondary | ICD-10-CM | POA: Diagnosis not present

## 2021-08-28 MED ORDER — VITAMIN D (ERGOCALCIFEROL) 1.25 MG (50000 UNIT) PO CAPS: 50000.0000 [IU] | ORAL_CAPSULE | ORAL | 0 refills | Status: DC

## 2021-09-11 NOTE — Progress Notes (Signed)
Chief Complaint:   OBESITY Amber Parrish is here to discuss her progress with her obesity treatment plan along with follow-up of her obesity related diagnoses. Amber Parrish is on the Category 3 Plan and states she is following her eating plan approximately 60% of the time. Amber Parrish states she is walking 30 minutes 2-3 times per week.  Today's visit was #: 8 Starting weight: 246 lbs Starting date: 04/24/2021 Today's weight: 231 lbs Today's date: 08/28/2021 Total lbs lost to date: 15 Total lbs lost since last in-office visit: 3  Interim History: Amber Parrish has been traveling with work lately and has been more focused on protein and drinking more water. She did not skip any meals. Pt has gained 10 lbs of muscle mass and lost 13 lbs of fat mass.  Subjective:   1. Vitamin D deficiency She is currently taking prescription vitamin D 50,000 IU each week. She denies nausea, vomiting or muscle weakness.  Lab Results  Component Value Date   VD25OH 31.0 04/24/2021   VD25OH 39.1 11/15/2019   VD25OH 28.0 (L) 07/03/2019   2. At risk of diabetes mellitus Amber Parrish is at higher than average risk for developing diabetes due to her prediabetes.  Assessment/Plan:  No orders of the defined types were placed in this encounter.   Medications Discontinued During This Encounter  Medication Reason   Vitamin D, Ergocalciferol, (DRISDOL) 1.25 MG (50000 UNIT) CAPS capsule Reorder     Meds ordered this encounter  Medications   Vitamin D, Ergocalciferol, (DRISDOL) 1.25 MG (50000 UNIT) CAPS capsule    Sig: Take 1 capsule (50,000 Units total) by mouth every 7 (seven) days.    Dispense:  4 capsule    Refill:  0     1. Vitamin D deficiency Low Vitamin D level contributes to fatigue and are associated with obesity, breast, and colon cancer. She agrees to continue to take prescription Vitamin D @50 ,000 IU every week and will follow-up for routine testing of Vitamin D, at least 2-3 times per year to avoid  over-replacement.  Refill- Vitamin D, Ergocalciferol, (DRISDOL) 1.25 MG (50000 UNIT) CAPS capsule; Take 1 capsule (50,000 Units total) by mouth every 7 (seven) days.  Dispense: 4 capsule; Refill: 0  2. At risk of diabetes mellitus Amber Parrish was given approximately 9 minutes of diabetic education and counseling today. We discussed intensive lifestyle modifications today with an emphasis on weight loss as well as increasing exercise and decreasing simple carbohydrates in her diet. We also reviewed medication options with an emphasis on risk versus benefits of those discussed.  3. Obesity with current with BMI of 39.8  Amber Parrish is currently in the action stage of change. As such, her goal is to continue with weight loss efforts. She has agreed to the Category 3 Plan.   Pt will reflect on the past several weeks to help identify why she was so successful.  Exercise goals:  As is.  Behavioral modification strategies: increasing lean protein intake, decreasing simple carbohydrates, and planning for success.  Amber Parrish has agreed to follow-up with our clinic in 3-4 weeks. She was informed of the importance of frequent follow-up visits to maximize her success with intensive lifestyle modifications for her multiple health conditions.   Objective:   Blood pressure 122/82, pulse 79, temperature 98.5 F (36.9 C), height 5\' 4"  (1.626 m), weight 231 lb (104.8 kg), SpO2 98 %. Body mass index is 39.65 kg/m.  General: Cooperative, alert, well developed, in no acute distress. HEENT: Conjunctivae and lids unremarkable. Cardiovascular:  Regular rhythm.  Lungs: Normal work of breathing. Neurologic: No focal deficits.   Lab Results  Component Value Date   CREATININE 0.66 04/24/2021   BUN 14 04/24/2021   NA 139 04/24/2021   K 4.5 04/24/2021   CL 102 04/24/2021   CO2 24 04/24/2021   Lab Results  Component Value Date   ALT 23 04/24/2021   AST 21 04/24/2021   ALKPHOS 77 04/24/2021   BILITOT 0.4  04/24/2021   Lab Results  Component Value Date   HGBA1C 5.7 (H) 04/24/2021   HGBA1C 5.7 (H) 11/15/2019   HGBA1C 5.7 (H) 07/03/2019   Lab Results  Component Value Date   INSULIN 13.3 04/24/2021   INSULIN 17.5 11/15/2019   INSULIN 16.7 07/03/2019   Lab Results  Component Value Date   TSH 1.120 04/24/2021   Lab Results  Component Value Date   CHOL 190 04/24/2021   HDL 80 04/24/2021   LDLCALC 96 04/24/2021   TRIG 74 04/24/2021   CHOLHDL 3.4 11/15/2019   Lab Results  Component Value Date   VD25OH 31.0 04/24/2021   VD25OH 39.1 11/15/2019   VD25OH 28.0 (L) 07/03/2019   Lab Results  Component Value Date   WBC 4.4 04/24/2021   HGB 13.5 04/24/2021   HCT 41.5 04/24/2021   MCV 90 04/24/2021   PLT 270 04/24/2021    Attestation Statements:   Reviewed by clinician on day of visit: allergies, medications, problem list, medical history, surgical history, family history, social history, and previous encounter notes.  I, Amber Parrish, BS, CMA, am acting as transcriptionist for [employer], DO.  I have reviewed the above documentation for accuracy and completeness, and I agree with the above. Amber Parrish, D.O.  The 21st Century Cures Act was signed into law in 2016 which includes the topic of electronic health records.  This provides immediate access to information in MyChart.  This includes consultation notes, operative notes, office notes, lab results and pathology reports.  If you have any questions about what you read please let us know at your next visit so we can discuss your concerns and take corrective action if need be.  We are right here with you.

## 2021-09-18 ENCOUNTER — Ambulatory Visit (INDEPENDENT_AMBULATORY_CARE_PROVIDER_SITE_OTHER): Payer: 59 | Admitting: Family Medicine

## 2021-09-18 ENCOUNTER — Encounter (INDEPENDENT_AMBULATORY_CARE_PROVIDER_SITE_OTHER): Payer: Self-pay | Admitting: Family Medicine

## 2021-09-18 VITALS — BP 113/60 | HR 71 | Temp 98.4°F | Ht 64.0 in | Wt 233.0 lb

## 2021-09-18 DIAGNOSIS — Z7985 Long-term (current) use of injectable non-insulin antidiabetic drugs: Secondary | ICD-10-CM

## 2021-09-18 DIAGNOSIS — E669 Obesity, unspecified: Secondary | ICD-10-CM | POA: Diagnosis not present

## 2021-09-18 DIAGNOSIS — E559 Vitamin D deficiency, unspecified: Secondary | ICD-10-CM

## 2021-09-18 DIAGNOSIS — R7303 Prediabetes: Secondary | ICD-10-CM | POA: Diagnosis not present

## 2021-09-18 DIAGNOSIS — R632 Polyphagia: Secondary | ICD-10-CM

## 2021-09-18 DIAGNOSIS — Z9189 Other specified personal risk factors, not elsewhere classified: Secondary | ICD-10-CM

## 2021-09-18 DIAGNOSIS — Z6841 Body Mass Index (BMI) 40.0 and over, adult: Secondary | ICD-10-CM

## 2021-09-18 MED ORDER — SEMAGLUTIDE-WEIGHT MANAGEMENT 0.25 MG/0.5ML ~~LOC~~ SOAJ: SUBCUTANEOUS | 0 refills | Status: DC

## 2021-09-18 MED ORDER — VITAMIN D (ERGOCALCIFEROL) 1.25 MG (50000 UNIT) PO CAPS: 50000.0000 [IU] | ORAL_CAPSULE | ORAL | 0 refills | Status: DC

## 2021-09-19 LAB — HEMOGLOBIN A1C
Est. average glucose Bld gHb Est-mCnc: 111 mg/dL
Hgb A1c MFr Bld: 5.5 % (ref 4.8–5.6)

## 2021-09-19 LAB — VITAMIN D 25 HYDROXY (VIT D DEFICIENCY, FRACTURES): Vit D, 25-Hydroxy: 42.9 ng/mL (ref 30.0–100.0)

## 2021-09-19 LAB — INSULIN, RANDOM: INSULIN: 11.4 u[IU]/mL (ref 2.6–24.9)

## 2021-09-24 ENCOUNTER — Telehealth (INDEPENDENT_AMBULATORY_CARE_PROVIDER_SITE_OTHER): Payer: Self-pay | Admitting: Family Medicine

## 2021-09-24 ENCOUNTER — Encounter (INDEPENDENT_AMBULATORY_CARE_PROVIDER_SITE_OTHER): Payer: Self-pay

## 2021-09-24 NOTE — Telephone Encounter (Signed)
Dr. Opalski - Prior authorization denied for Wegovy. Per insurance: Drug not covered/plan exclusion. Patient sent denial message via mychart.  

## 2021-09-25 NOTE — Progress Notes (Unsigned)
Chief Complaint:   OBESITY Amber Parrish is here to discuss her progress with her obesity treatment plan along with follow-up of her obesity related diagnoses. [name] is on the Category 3 Plan and states she is following her eating plan approximately 80% of the time. [name] states she is not currently exercising.  Today's visit was #: 9 Starting weight: 246 lbs Starting date: 04/24/2021 Today's weight: 233 lbs Today's date: 09/18/2021 Total lbs lost to date: 13 Total lbs lost since last in-office visit: +2  Interim History: Amber Parrish had her menses recently and was with increased hunger and cravings.  Subjective:   1. Pre-diabetes Amber Parrish reports having no cravings, except for emotional cravings. Her hunger is well controlled, except when she has her menstrual cycle.  2. Polyphagia Pt has no history of pancreatitis on MEN I or II. Amber Parrish endorses excessive hunger.   3. Vitamin D deficiency She is currently taking prescription vitamin D 50,000 IU each week. She denies nausea, vomiting or muscle weakness.  4. At risk for side effect of medication Amber Parrish is at risk for side effects of medication due to starting a new medication.  Assessment/Plan:   Orders Placed This Encounter  Procedures   Insulin, random   Hemoglobin A1c   VITAMIN D 25 Hydroxy (Vit-D Deficiency, Fractures)    Medications Discontinued During This Encounter  Medication Reason   Vitamin D, Ergocalciferol, (DRISDOL) 1.25 MG (50000 UNIT) CAPS capsule Reorder     Meds ordered this encounter  Medications   Vitamin D, Ergocalciferol, (DRISDOL) 1.25 MG (50000 UNIT) CAPS capsule    Sig: Take 1 capsule (50,000 Units total) by mouth every 7 (seven) days.    Dispense:  4 capsule    Refill:  0   Semaglutide-Weight Management 0.25 MG/0.5ML SOAJ    Sig: 0.25mg  sq once wkly    Dispense:  2 mL    Refill:  0     1. Pre-diabetes Amber Parrish will continue to work on weight loss, exercise, and decreasing simple  carbohydrates to help decrease the risk of diabetes. Obtain labs today.  - Insulin, random - Hemoglobin A1c  2. Polyphagia Intensive lifestyle modifications are the first line treatment for this issue. We discussed several lifestyle modifications today and she will continue to work on diet, exercise and weight loss efforts. Orders and follow up as documented in patient record. Start Wegovy 0.25 mg weekly. Risks and benefits discussed with pt.  Counseling Polyphagia is excessive hunger. Causes can include: low blood sugars, hypERthyroidism, PMS, lack of sleep, stress, insulin resistance, diabetes, certain medications, and diets that are deficient in protein and fiber.   Start- Semaglutide-Weight Management 0.25 MG/0.5ML SOAJ; 0.25mg  sq once wkly  Dispense: 2 mL; Refill: 0  3. Vitamin D deficiency Low Vitamin D level contributes to fatigue and are associated with obesity, breast, and colon cancer. She agrees to continue to take prescription Vitamin D @50 ,000 IU every week and will follow-up for routine testing of Vitamin D, at least 2-3 times per year to avoid over-replacement. Check labs today.  Refill- Vitamin D, Ergocalciferol, (DRISDOL) 1.25 MG (50000 UNIT) CAPS capsule; Take 1 capsule (50,000 Units total) by mouth every 7 (seven) days.  Dispense: 4 capsule; Refill: 0  - VITAMIN D 25 Hydroxy (Vit-D Deficiency, Fractures)  4. At risk for side effect of medication Due to Annete's current conditions and medications, she is at a higher risk for drug side effect. At least 9 minutes was spent on counseling her about these concerns today.  We discussed the benefits and potential risks of these medications, and all of patient's concerns were addressed and questions were answered. She will call us, or their PCP or other specialists who treat their conditions with medications, with any questions or concerns that may develop.    5. Obesity, current BMI 40 [name] is currently in the action stage of  change. As such, her goal is to continue with weight loss efforts. She has agreed to the Category 3 Plan.   We discussed various medication options to help Majestic with her weight loss efforts and we both agreed to starting [facility] as per below.  Start- Semaglutide-Weight Management 0.25 MG/0.5ML SOAJ; 0.25mg  sq once wkly  Dispense: 2 mL; Refill: 0  Exercise goals: All adults should avoid inactivity. Some physical activity is better than none, and adults who participate in any amount of physical activity gain some health benefits.  Behavioral modification strategies: increasing lean protein intake, decreasing simple carbohydrates, and no skipping meals.  Amber Parrish has agreed to follow-up with our clinic in 2-3 weeks. She was informed of the importance of frequent follow-up visits to maximize her success with intensive lifestyle modifications for her multiple health conditions.   Amber Parrish was informed we would discuss her lab results at her next visit unless there is a critical issue that needs to be addressed sooner. Amber Parrish agreed to keep her next visit at the agreed upon time to discuss these results.  Objective:   Blood pressure 113/60, pulse 71, temperature 98.4 F (36.9 C), height 5\' 4"  (1.626 m), weight 233 lb (105.7 kg), SpO2 98 %. Body mass index is 39.99 kg/m.  General: Cooperative, alert, well developed, in no acute distress. HEENT: Conjunctivae and lids unremarkable. Cardiovascular: Regular rhythm.  Lungs: Normal work of breathing. Neurologic: No focal deficits.   Lab Results  Component Value Date   CREATININE 0.66 04/24/2021   BUN 14 04/24/2021   NA 139 04/24/2021   K 4.5 04/24/2021   CL 102 04/24/2021   CO2 24 04/24/2021   Lab Results  Component Value Date   ALT 23 04/24/2021   AST 21 04/24/2021   ALKPHOS 77 04/24/2021   BILITOT 0.4 04/24/2021   Lab Results  Component Value Date   HGBA1C 5.5 09/18/2021   HGBA1C 5.7 (H) 04/24/2021   HGBA1C 5.7 (H) 11/15/2019    HGBA1C 5.7 (H) 07/03/2019   Lab Results  Component Value Date   INSULIN 11.4 09/18/2021   INSULIN 13.3 04/24/2021   INSULIN 17.5 11/15/2019   INSULIN 16.7 07/03/2019   Lab Results  Component Value Date   TSH 1.120 04/24/2021   Lab Results  Component Value Date   CHOL 190 04/24/2021   HDL 80 04/24/2021   LDLCALC 96 04/24/2021   TRIG 74 04/24/2021   CHOLHDL 3.4 11/15/2019   Lab Results  Component Value Date   VD25OH 42.9 09/18/2021   VD25OH 31.0 04/24/2021   VD25OH 39.1 11/15/2019   Lab Results  Component Value Date   WBC 4.4 04/24/2021   HGB 13.5 04/24/2021   HCT 41.5 04/24/2021   MCV 90 04/24/2021   PLT 270 04/24/2021    Attestation Statements:   Reviewed by clinician on day of visit: allergies, medications, problem list, medical history, surgical history, family history, social history, and previous encounter notes.  I, 06/22/2021, BS, CMA, am acting as transcriptionist for Kyung Rudd, DO.  I have reviewed the above documentation for accuracy and completeness, and I agree with the above. [employer]  Jaylyne Breese, D.O.  The [place] was signed into law in 2016 which includes the topic of electronic health records.  This provides immediate access to information in MyChart.  This includes consultation notes, operative notes, office notes, lab results and pathology reports.  If you have any questions about what you read please let us know at your next visit so we can discuss your concerns and take corrective action if need be.  We are right here with you.

## 2021-10-09 ENCOUNTER — Ambulatory Visit (INDEPENDENT_AMBULATORY_CARE_PROVIDER_SITE_OTHER): Payer: 59 | Admitting: Family Medicine

## 2021-10-09 ENCOUNTER — Encounter (INDEPENDENT_AMBULATORY_CARE_PROVIDER_SITE_OTHER): Payer: Self-pay | Admitting: Family Medicine

## 2021-10-09 VITALS — BP 115/68 | HR 69 | Temp 97.8°F | Ht 64.0 in | Wt 232.0 lb

## 2021-10-09 DIAGNOSIS — E669 Obesity, unspecified: Secondary | ICD-10-CM

## 2021-10-09 DIAGNOSIS — Z9189 Other specified personal risk factors, not elsewhere classified: Secondary | ICD-10-CM

## 2021-10-09 DIAGNOSIS — Z6839 Body mass index (BMI) 39.0-39.9, adult: Secondary | ICD-10-CM

## 2021-10-09 DIAGNOSIS — R7303 Prediabetes: Secondary | ICD-10-CM | POA: Diagnosis not present

## 2021-10-09 DIAGNOSIS — Z6841 Body Mass Index (BMI) 40.0 and over, adult: Secondary | ICD-10-CM

## 2021-10-09 DIAGNOSIS — E559 Vitamin D deficiency, unspecified: Secondary | ICD-10-CM | POA: Diagnosis not present

## 2021-10-09 MED ORDER — VITAMIN D (ERGOCALCIFEROL) 1.25 MG (50000 UNIT) PO CAPS: 50000.0000 [IU] | ORAL_CAPSULE | ORAL | 0 refills | Status: DC

## 2021-10-14 DIAGNOSIS — Z9189 Other specified personal risk factors, not elsewhere classified: Secondary | ICD-10-CM | POA: Insufficient documentation

## 2021-10-14 NOTE — Progress Notes (Signed)
Chief Complaint:   OBESITY Amber Parrish is here to discuss her progress with her obesity treatment plan along with follow-up of her obesity related diagnoses. [name] is on the Category 3 Plan and states she is following her eating plan approximately 85% of the time. [name] states she is walking 30 minutes 3 times per week.  Today's visit was #: 10 Starting weight: 246 lbs Starting date: 04/24/2021 Today's weight: 232 lbs Today's date: 10/09/2021 Total lbs lost to date: 14 lbs Total lbs lost since last in-office visit: 1 lb  Interim History: No issues with meal plan.   Subjective:   1. Pre-diabetes Amber Parrish has been informed that Reginal Lutes is not covered by her insurance.  She has hunger 1-2 times after eating especially after lunch, she is eating microwave meals.  2. Vitamin D deficiency Amber Parrish's energy has been good.  Her compliance of medications has been excellent.    3. At risk for diabetes mellitus Amber Parrish is at higher than average risk for developing diabetes due to her obesity and prediabetes.  Assessment/Plan:  No orders of the defined types were placed in this encounter.   Medications Discontinued During This Encounter  Medication Reason   Semaglutide-Weight Management 0.25 MG/0.5ML SOAJ    Vitamin D, Ergocalciferol, (DRISDOL) 1.25 MG (50000 UNIT) CAPS capsule Reorder     Meds ordered this encounter  Medications   Vitamin D, Ergocalciferol, (DRISDOL) 1.25 MG (50000 UNIT) CAPS capsule    Sig: Take 1 capsule (50,000 Units total) by mouth every 7 (seven) days.    Dispense:  4 capsule    Refill:  0     1. Pre-diabetes Improving fasting insulin down to 11.4.  A1c is down from 5.7 to 5.5.  Gave Jessilyn handouts on insulin resistance, prediabetes, and metformin.    2. Vitamin D deficiency Vitamin D is at goal, 42.9.  [name] will continue weekly  ergocalciferol.  Refill - Vitamin D, Ergocalciferol, (DRISDOL) 1.25 MG (50000 UNIT) CAPS capsule; Take 1 capsule  (50,000 Units total) by mouth every 7 (seven) days.  Dispense: 4 capsule; Refill: 0  3. At risk for diabetes mellitus Amber Parrish was given approximately 15 minutes of diabetic education and counseling today. We discussed intensive lifestyle modifications today with an emphasis on weight loss as well as increasing exercise and decreasing simple carbohydrates in her diet. We also reviewed medication options with an emphasis on risk versus benefits of those discussed.  Repetitive spaced learning was employed today to elicit superior memory formation and behavioral change.   4. Obesity, current BMI 39.9 Change lunch from microwave meals to 2 wraps with 3 oz each.  Increase protein and fiber at lunch.    Amber Parrish is currently in the action stage of change. As such, her goal is to continue with weight loss efforts. She has agreed to the Category 3 Plan with breakfast and lunch options.   Exercise goals: For substantial health benefits, adults should do at least 150 minutes (2 hours and 30 minutes) a week of moderate-intensity, or 75 minutes (1 hour and 15 minutes) a week of vigorous-intensity aerobic physical activity, or an equivalent combination of moderate- and vigorous-intensity aerobic activity. Aerobic activity should be performed in episodes of at least 10 minutes, and preferably, it should be spread throughout the week.  Behavioral modification strategies: increasing lean protein intake and decreasing simple carbohydrates.  Amber Parrish has agreed to follow-up with our clinic in 3 weeks. She was informed of the importance of frequent follow-up visits to maximize  her success with intensive lifestyle modifications for her multiple health conditions.   Objective:   Blood pressure 115/68, pulse 69, temperature 97.8 F (36.6 C), height 5\' 4"  (1.626 m), weight 232 lb (105.2 kg), SpO2 98 %. Body mass index is 39.82 kg/m.  General: Cooperative, alert, well developed, in no acute distress. HEENT:  Conjunctivae and lids unremarkable. Cardiovascular: Regular rhythm.  Lungs: Normal work of breathing. Neurologic: No focal deficits.   Lab Results  Component Value Date   CREATININE 0.66 04/24/2021   BUN 14 04/24/2021   NA 139 04/24/2021   K 4.5 04/24/2021   CL 102 04/24/2021   CO2 24 04/24/2021   Lab Results  Component Value Date   ALT 23 04/24/2021   AST 21 04/24/2021   ALKPHOS 77 04/24/2021   BILITOT 0.4 04/24/2021   Lab Results  Component Value Date   HGBA1C 5.5 09/18/2021   HGBA1C 5.7 (H) 04/24/2021   HGBA1C 5.7 (H) 11/15/2019   HGBA1C 5.7 (H) 07/03/2019   Lab Results  Component Value Date   INSULIN 11.4 09/18/2021   INSULIN 13.3 04/24/2021   INSULIN 17.5 11/15/2019   INSULIN 16.7 07/03/2019   Lab Results  Component Value Date   TSH 1.120 04/24/2021   Lab Results  Component Value Date   CHOL 190 04/24/2021   HDL 80 04/24/2021   LDLCALC 96 04/24/2021   TRIG 74 04/24/2021   CHOLHDL 3.4 11/15/2019   Lab Results  Component Value Date   VD25OH 42.9 09/18/2021   VD25OH 31.0 04/24/2021   VD25OH 39.1 11/15/2019   Lab Results  Component Value Date   WBC 4.4 04/24/2021   HGB 13.5 04/24/2021   HCT 41.5 04/24/2021   MCV 90 04/24/2021   PLT 270 04/24/2021   No results found for: "IRON", "TIBC", "FERRITIN"  Attestation Statements:   Reviewed by clinician on day of visit: allergies, medications, problem list, medical history, surgical history, family history, social history, and previous encounter notes.  I, Malcolm Metro, RMA, am acting as Energy manager for [employer], DO.   I have reviewed the above documentation for accuracy and completeness, and I agree with the above. -   I have reviewed the above documentation for accuracy and completeness, and I agree with the above. Carlye Grippe, D.O.  The 21st Century Cures Act was signed into law in 2016 which includes the topic of electronic health records.  This provides immediate access to  information in MyChart.  This includes consultation notes, operative notes, office notes, lab results and pathology reports.  If you have any questions about what you read please let us know at your next visit so we can discuss your concerns and take corrective action if need be.  We are right here with you.

## 2021-10-29 ENCOUNTER — Other Ambulatory Visit: Payer: Self-pay | Admitting: Neurology

## 2021-11-05 NOTE — Progress Notes (Signed)
NEUROLOGY FOLLOW UP OFFICE NOTE  Amber Parrish [id number]  Assessment/Plan:   Chronic daily headaches- semiology at this time seems consistent with tension-type headaches   Start sertraline 50mg  daily - contact me in 6 weeks with update.  She would like to avoid medications that may cause weight gain, such as nortriptyline. Take Trokendi XR 50mg  daily for one week, then STOP For rescue, she will try Excedrin or Aleve.  Limit use of pain relievers to no more than 2 days out of week to prevent risk of rebound or medication-overuse headache. Tizanidine 2mg  as needed for acute treatment.   Keep headache diary Follow up 6 months.     Subjective:  Amber Parrish is a 51 year old right-handed female who follows up for headaches.  UPDATE: Started Trokendi XR in January.  Titrated up to 100mg  daily.  She had a dilated eye exam in January, which was negative for papilledema.  Headaches remained constant.  Referred to Sports Medicine for management of neck pain which may be a contributing factor to her headaches.  She saw Dr. 52 in March.  X-ray of cervical spine was actually unremarkable but she seemed to have responded to OMT.     No change in headaches.  At first may have been less intense but still daily.  More recently they are more severe again.  Not taking OTC NSAIDs or analgesics.  Unable to take tizanidine during the day because it makes her sleepy but helpful at night to fall asleep (makes her sleepy and also may reduce intensity of headache).  Prior occipital pressure now diffuse.  Light activity such as conversation aggravates the headache.     Current NSAIDS/analgesics:  none Current triptans:  none Current ergotamine:  none Current anti-emetic:  none Current muscle relaxants:  none Current Antihypertensive medications:  none Current Antidepressant medications:  none Current Anticonvulsant medications:  Trokendi XR 100mg  at bedtime Current anti-CGRP:   none Current Vitamins/Herbal/Supplements:  none Current Antihistamines/Decongestants:  none Other therapy:  none Hormone/birth control:  none  Caffeine:  occasional coffee Diet:  Hydrates.  Watches her diet.  No soda.  [facility]. Exercise:  not routine Depression:  no; Anxiety: stable  Other pain:  no Sleep hygiene:  wakes up more often in the middle of the night. Maybe 6 hours of sleep.  No daytime sleepiness.    HISTORY:  She began having headaches around the summer 2022.   It is a unilateral pressure occipital headache (either side) that begins as 2-3/10 in the morning and gradually spreads to the forehead at a 6-7/10 intensity late in the day.  Sometimes neck pain by end of day.  She has photosensitivity but denies nausea, vomiting, phonophobia, autonomic symptoms, dizziness, numbness or weakness.  They occur daily.  Bright light and intense smells are triggers.  Rest and hydration help relieve it.  MRI and MRV of brain with and without contrast in November personally reviewed were unremarkable.  By late November, she started noticing blurred vision whenever concentrating/reading or using computer but denies visual obscurations.  She has not recently had an eye exam.  When headaches are more severe, she may hear a whooshing in her head.  She tried treating with ibuprofen but since ineffective, she has not taken any analgesics.   Her father passed away in early Aug 18, 2020 but no new stressors by the time headaches started.  She works as an 12-28-[employer].  Work and home  life is the same.  No injuries or illness.      Past NSAIDS/analgesics:  ibuprofen Past abortive triptans:  none Past abortive ergotamine:  none Past muscle relaxants:  none Past anti-emetic:  none Past antihypertensive medications:  furosemide Past antidepressant medications:  Wellbutrin Past anticonvulsant medications:  none Past anti-CGRP:  none Past vitamins/Herbal/Supplements:   none Past antihistamines/decongestants:  Benadryl Other past therapies:  none    Family history of headache:  not that she knows.   PAST MEDICAL HISTORY: Past Medical History:  Diagnosis Date   Bilateral hand swelling    Headache    Heartburn    Joint pain    Muscle pain    Other fatigue    Overweight    Shortness of breath on exertion     MEDICATIONS: Current Outpatient Medications on File Prior to Visit  Medication Sig Dispense Refill   tiZANidine (ZANAFLEX) 2 MG tablet TAKE 1 TABLET BY MOUTH 3 TIMES A DAY AS NEEDED 90 tablet 0   Topiramate ER (TROKENDI XR) 100 MG CP24 TAKE 100 MG BY MOUTH DAILY AT 2 PM. 90 capsule 0   Vitamin D, Ergocalciferol, (DRISDOL) 1.25 MG (50000 UNIT) CAPS capsule Take 1 capsule (50,000 Units total) by mouth every 7 (seven) days. 4 capsule 0   No current facility-administered medications on file prior to visit.    ALLERGIES: Allergies  Allergen Reactions   Penicillins     FAMILY HISTORY: Family History  Problem Relation Age of Onset   Diabetes Mother    Hypertension Mother    Obesity Mother    Diabetes Father    Alcohol abuse Father    Arthritis Maternal Grandmother    Cancer Maternal Grandmother    Diabetes Maternal Grandmother    Hypertension Maternal Grandmother       Objective:  Blood pressure 140/74, pulse 71, resp. rate 18, height 5\' 4"  (1.626 m), weight 233 lb (105.7 kg), SpO2 97 %. General: No acute distress.  Patient appears well-groomed.   , DO  CC: Shon Millet, MD

## 2021-11-06 ENCOUNTER — Encounter (INDEPENDENT_AMBULATORY_CARE_PROVIDER_SITE_OTHER): Payer: Self-pay | Admitting: Family Medicine

## 2021-11-06 ENCOUNTER — Ambulatory Visit: Payer: 59 | Admitting: Neurology

## 2021-11-06 ENCOUNTER — Encounter: Payer: Self-pay | Admitting: Neurology

## 2021-11-06 ENCOUNTER — Ambulatory Visit (INDEPENDENT_AMBULATORY_CARE_PROVIDER_SITE_OTHER): Payer: 59 | Admitting: Family Medicine

## 2021-11-06 VITALS — BP 140/74 | HR 71 | Resp 18 | Ht 64.0 in | Wt 233.0 lb

## 2021-11-06 VITALS — BP 116/74 | HR 70 | Temp 97.5°F | Ht 64.0 in | Wt 229.0 lb

## 2021-11-06 DIAGNOSIS — E559 Vitamin D deficiency, unspecified: Secondary | ICD-10-CM

## 2021-11-06 DIAGNOSIS — G44221 Chronic tension-type headache, intractable: Secondary | ICD-10-CM | POA: Diagnosis not present

## 2021-11-06 DIAGNOSIS — Z6839 Body mass index (BMI) 39.0-39.9, adult: Secondary | ICD-10-CM

## 2021-11-06 DIAGNOSIS — R7303 Prediabetes: Secondary | ICD-10-CM | POA: Diagnosis not present

## 2021-11-06 DIAGNOSIS — G43809 Other migraine, not intractable, without status migrainosus: Secondary | ICD-10-CM

## 2021-11-06 DIAGNOSIS — Z9189 Other specified personal risk factors, not elsewhere classified: Secondary | ICD-10-CM

## 2021-11-06 DIAGNOSIS — E669 Obesity, unspecified: Secondary | ICD-10-CM | POA: Diagnosis not present

## 2021-11-06 MED ORDER — METFORMIN HCL 500 MG PO TABS: ORAL_TABLET | ORAL | 0 refills | Status: DC

## 2021-11-06 MED ORDER — TOPIRAMATE ER 50 MG PO CAP24
50.0000 mg | ORAL_CAPSULE | Freq: Every day | ORAL | 0 refills | Status: DC
Start: 2021-11-06 — End: 2021-11-13

## 2021-11-06 MED ORDER — VITAMIN D (ERGOCALCIFEROL) 1.25 MG (50000 UNIT) PO CAPS: 50000.0000 [IU] | ORAL_CAPSULE | ORAL | 0 refills | Status: DC

## 2021-11-06 MED ORDER — SERTRALINE HCL 50 MG PO TABS: 50.0000 mg | ORAL_TABLET | Freq: Every day | ORAL | 5 refills | Status: DC

## 2021-11-06 NOTE — Patient Instructions (Signed)
Start sertraline 50mg  daily.  Give me update in 6 weeks Stop Trokendi XR 100mg .  Take 50mg  daily for one week, then stop Take Excedrin Migraine or Aleve but limit to no more than 2 to 3 days a week Keep headache diary Follow up 4 months.

## 2021-11-10 NOTE — Progress Notes (Signed)
Chief Complaint:   OBESITY Amber Parrish is here to discuss her progress with her obesity treatment plan along with follow-up of her obesity related diagnoses. Amber Parrish is on the Category 3 Plan with breakfast and lunch options and states she is following her eating plan approximately 85-90% of the time. Amber Parrish states she is walking 30-40 minutes 5 times per week.  Today's visit was #: 11 Starting weight: 246 lbs Starting date: 04/24/2021 Today's weight: 229 lbs Today's date: 11/06/2021 Total lbs lost to date: 17 Total lbs lost since last in-office visit: 3  Interim History: Amber Parrish has no issues with meal plan. She has lost 3 lbs and is doing well.  Subjective:   1. Other migraine without status migrainosus, not intractable Amber Parrish saw Dr. Everlena Cooper this morning and he decreased her Trokendi and added Zoloft. She is still having daily headaches.  2. Vitamin D deficiency She is currently taking prescription vitamin D 50,000 IU each week. She denies nausea, vomiting or muscle weakness.  3. Pre-diabetes Amber Parrish has a diagnosis of prediabetes based on her elevated HgA1c and was informed this puts her at greater risk of developing diabetes. She continues to work on diet and exercise to decrease her risk of diabetes. She denies nausea or hypoglycemia.  4. At risk for side effect of medication Amber Parrish is at risk for side effects of medication due to starting a new medication.  Assessment/Plan:  No orders of the defined types were placed in this encounter.   Medications Discontinued During This Encounter  Medication Reason   Vitamin D, Ergocalciferol, (DRISDOL) 1.25 MG (50000 UNIT) CAPS capsule Reorder     Meds ordered this encounter  Medications   Vitamin D, Ergocalciferol, (DRISDOL) 1.25 MG (50000 UNIT) CAPS capsule    Sig: Take 1 capsule (50,000 Units total) by mouth every 7 (seven) days.    Dispense:  4 capsule    Refill:  0   metFORMIN (GLUCOPHAGE) 500 MG tablet    Sig: 1 po  with lunch daily    Dispense:  30 tablet    Refill:  0    30 d supply;  ** OV for RF **   Do not send RF request     1. Other migraine without status migrainosus, not intractable Continue treatment per neurology. Increase water intake to 115 oz per day plus extra for exercise or working outside.  2. Vitamin D deficiency Low Vitamin D level contributes to fatigue and are associated with obesity, breast, and colon cancer. She agrees to continue to take prescription Vitamin D @50 ,000 IU every week and will follow-up for routine testing of Vitamin D, at least 2-3 times per year to avoid over-replacement.  Refill- Vitamin D, Ergocalciferol, (DRISDOL) 1.25 MG (50000 UNIT) CAPS capsule; Take 1 capsule (50,000 Units total) by mouth every 7 (seven) days.  Dispense: 4 capsule; Refill: 0  3. Pre-diabetes Amber Parrish will continue to work on weight loss, exercise, and decreasing simple carbohydrates to help decrease the risk of diabetes. Start Metformin 250 mg  daily for 4 days, then is tolerable, increase to 1 full tab by mouth daily at lunch.  Start- metFORMIN (GLUCOPHAGE) 500 MG tablet; 1 po with lunch daily  Dispense: 30 tablet; Refill: 0  4. At risk for side effect of medication Due to Amber Parrish's current conditions and medications, she is at a higher risk for drug side effect.  At least 9 minutes was spent on counseling her about these concerns today.  We discussed the benefits and  potential risks of these medications, and all of patient's concerns were addressed and questions were answered.  she will call us, or their PCP or other specialists who treat their conditions with medications, with any questions or concerns that may develop.    5. Obesity, current BMI 39.4 Amber Parrish is currently in the action stage of change. As such, her goal is to continue with weight loss efforts. She has agreed to the Category 3 Plan with breakfast and lunch options.   Exercise goals:  As is  Behavioral modification  strategies: increasing lean protein intake, decreasing simple carbohydrates, and planning for success.  Amber Parrish has agreed to follow-up with our clinic in 3 weeks. She was informed of the importance of frequent follow-up visits to maximize her success with intensive lifestyle modifications for her multiple health conditions.   Objective:   Blood pressure 116/74, pulse 70, temperature (!) 97.5 F (36.4 C), height 5\' 4"  (1.626 m), weight 229 lb (103.9 kg), SpO2 98 %. Body mass index is 39.31 kg/m.  General: Cooperative, alert, well developed, in no acute distress. HEENT: Conjunctivae and lids unremarkable. Cardiovascular: Regular rhythm.  Lungs: Normal work of breathing. Neurologic: No focal deficits.   Lab Results  Component Value Date   CREATININE 0.66 04/24/2021   BUN 14 04/24/2021   NA 139 04/24/2021   K 4.5 04/24/2021   CL 102 04/24/2021   CO2 24 04/24/2021   Lab Results  Component Value Date   ALT 23 04/24/2021   AST 21 04/24/2021   ALKPHOS 77 04/24/2021   BILITOT 0.4 04/24/2021   Lab Results  Component Value Date   HGBA1C 5.5 09/18/2021   HGBA1C 5.7 (H) 04/24/2021   HGBA1C 5.7 (H) 11/15/2019   HGBA1C 5.7 (H) 07/03/2019   Lab Results  Component Value Date   INSULIN 11.4 09/18/2021   INSULIN 13.3 04/24/2021   INSULIN 17.5 11/15/2019   INSULIN 16.7 07/03/2019   Lab Results  Component Value Date   TSH 1.120 04/24/2021   Lab Results  Component Value Date   CHOL 190 04/24/2021   HDL 80 04/24/2021   LDLCALC 96 04/24/2021   TRIG 74 04/24/2021   CHOLHDL 3.4 11/15/2019   Lab Results  Component Value Date   VD25OH 42.9 09/18/2021   VD25OH 31.0 04/24/2021   VD25OH 39.1 11/15/2019   Lab Results  Component Value Date   WBC 4.4 04/24/2021   HGB 13.5 04/24/2021   HCT 41.5 04/24/2021   MCV 90 04/24/2021   PLT 270 04/24/2021    Attestation Statements:   Reviewed by clinician on day of visit: allergies, medications, problem list, medical history, surgical  history, family history, social history, and previous encounter notes.  I, 06/22/2021, BS, CMA, am acting as transcriptionist for Kyung Rudd, DO.   I have reviewed the above documentation for accuracy and completeness, and I agree with the above. [employer], D.O.  The 21st Century Cures Act was signed into law in 2016 which includes the topic of electronic health records.  This provides immediate access to information in MyChart.  This includes consultation notes, operative notes, office notes, lab results and pathology reports.  If you have any questions about what you read please let 2017 know at your next visit so we can discuss your concerns and take corrective action if need be.  We are right here with you.

## 2021-11-12 ENCOUNTER — Other Ambulatory Visit: Payer: Self-pay | Admitting: Neurology

## 2021-11-25 ENCOUNTER — Other Ambulatory Visit: Payer: Self-pay | Admitting: Neurology

## 2021-11-26 ENCOUNTER — Encounter (INDEPENDENT_AMBULATORY_CARE_PROVIDER_SITE_OTHER): Payer: Self-pay

## 2021-11-28 ENCOUNTER — Other Ambulatory Visit: Payer: Self-pay | Admitting: Neurology

## 2021-11-28 ENCOUNTER — Other Ambulatory Visit (INDEPENDENT_AMBULATORY_CARE_PROVIDER_SITE_OTHER): Payer: Self-pay | Admitting: Family Medicine

## 2021-11-28 DIAGNOSIS — R7303 Prediabetes: Secondary | ICD-10-CM

## 2021-12-01 NOTE — Telephone Encounter (Signed)
Left message to call office to give update, okay to refill per Dr. Shon Millet

## 2021-12-03 ENCOUNTER — Other Ambulatory Visit (INDEPENDENT_AMBULATORY_CARE_PROVIDER_SITE_OTHER): Payer: Self-pay | Admitting: Family Medicine

## 2021-12-03 DIAGNOSIS — R7303 Prediabetes: Secondary | ICD-10-CM

## 2021-12-11 ENCOUNTER — Ambulatory Visit (INDEPENDENT_AMBULATORY_CARE_PROVIDER_SITE_OTHER): Payer: 59 | Admitting: Family Medicine

## 2021-12-16 ENCOUNTER — Ambulatory Visit (INDEPENDENT_AMBULATORY_CARE_PROVIDER_SITE_OTHER): Payer: 59 | Admitting: Family Medicine

## 2021-12-16 ENCOUNTER — Encounter (INDEPENDENT_AMBULATORY_CARE_PROVIDER_SITE_OTHER): Payer: Self-pay | Admitting: Family Medicine

## 2021-12-16 VITALS — BP 123/76 | HR 64 | Temp 98.5°F | Ht 64.0 in | Wt 230.0 lb

## 2021-12-16 DIAGNOSIS — E7849 Other hyperlipidemia: Secondary | ICD-10-CM | POA: Diagnosis not present

## 2021-12-16 DIAGNOSIS — E559 Vitamin D deficiency, unspecified: Secondary | ICD-10-CM

## 2021-12-16 DIAGNOSIS — E669 Obesity, unspecified: Secondary | ICD-10-CM

## 2021-12-16 DIAGNOSIS — R7303 Prediabetes: Secondary | ICD-10-CM

## 2021-12-16 DIAGNOSIS — Z6839 Body mass index (BMI) 39.0-39.9, adult: Secondary | ICD-10-CM

## 2021-12-16 DIAGNOSIS — Z9189 Other specified personal risk factors, not elsewhere classified: Secondary | ICD-10-CM

## 2021-12-16 MED ORDER — METFORMIN HCL 500 MG PO TABS: ORAL_TABLET | ORAL | 0 refills | Status: DC

## 2021-12-16 MED ORDER — VITAMIN D (ERGOCALCIFEROL) 1.25 MG (50000 UNIT) PO CAPS: 50000.0000 [IU] | ORAL_CAPSULE | ORAL | 0 refills | Status: DC

## 2021-12-24 ENCOUNTER — Encounter: Payer: Self-pay | Admitting: Internal Medicine

## 2021-12-24 NOTE — Progress Notes (Signed)
Chief Complaint:   OBESITY Amber Parrish is here to discuss her progress with her obesity treatment plan along with follow-up of her obesity related diagnoses. Amber Parrish is on the Category 3 Plan and states she is following her eating plan approximately 85% of the time. Amber Parrish states she is cardio 30 minutes 3 times per week.  Today's visit was #: 12 Starting weight: 246 lbs Starting date: 04/24/2021 Today's weight: 230 lbs Today's date: 12/16/2021 Total lbs lost to date: 16 Total lbs lost since last in-office visit: +1  Interim History: [place] in [place] over the [place] and didn't have the healthiest choices. She has no issues with the plan but still skips meals at times. Pt is more consistent with going to the gym.  Subjective:   1. Pre-diabetes Amber Parrish reports that she has less food noise in her head and less hunger. Metformin was started at her last OV and she is tolerating to well. She did have GI issues initially for 3-4 days and is now symptom free.  2. Vitamin D deficiency She is currently taking prescription vitamin D 50,000 IU each week. She denies nausea, vomiting or muscle weakness.  3. Other hyperlipidemia Amber Parrish has a history of elevated LD on last check 7 months ago. She is not on medication.  4. At risk for side effect of medication Amber Parrish is at risk for side effects of medication due to increasing meds.  Assessment/Plan:   Orders Placed This Encounter  Procedures   Hemoglobin A1c   Insulin, random   Lipid Panel With LDL/HDL Ratio   VITAMIN D 25 Hydroxy (Vit-D Deficiency, Fractures)   Vitamin B12    Medications Discontinued During This Encounter  Medication Reason   Vitamin D, Ergocalciferol, (DRISDOL) 1.25 MG (50000 UNIT) CAPS capsule Reorder   metFORMIN (GLUCOPHAGE) 500 MG tablet Reorder     Meds ordered this encounter  Medications   Vitamin D, Ergocalciferol, (DRISDOL) 1.25 MG (50000 UNIT) CAPS capsule    Sig: Take 1 capsule  (50,000 Units total) by mouth every 7 (seven) days.    Dispense:  4 capsule    Refill:  0   metFORMIN (GLUCOPHAGE) 500 MG tablet    Sig: 1 po with lunch  and dinner daily    Dispense:  60 tablet    Refill:  0    30 d supply;  ** OV for RF **   Do not send RF request     1. Pre-diabetes Keven will continue to work on weight loss, exercise, and decreasing simple carbohydrates to help decrease the risk of diabetes. Increase Metformin to 1 tab at lunch and 1 tab at dinner.   Increase & Refill- metFORMIN (GLUCOPHAGE) 500 MG tablet; 1 po with lunch  and dinner daily  Dispense: 60 tablet; Refill: 0  Check labs at next OV. - Hemoglobin A1c; Future - Insulin, random - Vitamin B12  2. Vitamin D deficiency Not at goal. Low Vitamin D level contributes to fatigue and are associated with obesity, breast, and colon cancer. She agrees to continue to take prescription Vitamin D @50 ,000 IU every week and will follow-up for routine testing of Vitamin D, at least 2-3 times per year to avoid over-replacement.  Refill- Vitamin D, Ergocalciferol, (DRISDOL) 1.25 MG (50000 UNIT) CAPS capsule; Take 1 capsule (50,000 Units total) by mouth every 7 (seven) days.  Dispense: 4 capsule; Refill: 0  Check labs at next OV. - VITAMIN D 25 Hydroxy (Vit-D Deficiency, Fractures)  3. Other hyperlipidemia Cardiovascular risk and specific lipid/LDL goals reviewed.  We discussed several lifestyle modifications today and Samanthia will continue to work on diet, exercise and weight loss efforts. Orders and follow up as documented in patient record.   Counseling Intensive lifestyle modifications are the first line treatment for this issue. Dietary changes: Increase soluble fiber. Decrease simple carbohydrates. Exercise changes: Moderate to vigorous-intensity aerobic activity 150 minutes per week if tolerated. Lipid-lowering medications: see documented in medical record.  Check fasting labs at next OV. - Lipid Panel With  LDL/HDL Ratio  4. At risk for side effect of medication Due to Amber Parrish's current conditions and medications, she is at a higher risk for drug side effect.  At least 9 minutes was spent on counseling her about these concerns today.  We discussed the benefits and potential risks of these medications, and all of patient's concerns were addressed and questions were answered.  she will call us, or their PCP or other specialists who treat their conditions with medications, with any questions or concerns that may develop.    5. Obesity, current BMI 39.6 Charlotte is currently in the action stage of change. As such, her goal is to continue with weight loss efforts. She has agreed to the Category 3 Plan.   Exercise goals:  As is  Behavioral modification strategies: increasing lean protein intake, decreasing simple carbohydrates, and planning for success.  Holley has agreed to follow-up with our clinic in 4 weeks, fasting for blood work. She was informed of the importance of frequent follow-up visits to maximize her success with intensive lifestyle modifications for her multiple health conditions.   Objective:   Blood pressure 123/76, pulse 64, temperature 98.5 F (36.9 C), height 5\' 4"  (1.626 m), weight 230 lb (104.3 kg), SpO2 98 %. Body mass index is 39.48 kg/m.  General: Cooperative, alert, well developed, in no acute distress. HEENT: Conjunctivae and lids unremarkable. Cardiovascular: Regular rhythm.  Lungs: Normal work of breathing. Neurologic: No focal deficits.   Lab Results  Component Value Date   CREATININE 0.66 04/24/2021   BUN 14 04/24/2021   NA 139 04/24/2021   K 4.5 04/24/2021   CL 102 04/24/2021   CO2 24 04/24/2021   Lab Results  Component Value Date   ALT 23 04/24/2021   AST 21 04/24/2021   ALKPHOS 77 04/24/2021   BILITOT 0.4 04/24/2021   Lab Results  Component Value Date   HGBA1C 5.5 09/18/2021   HGBA1C 5.7 (H) 04/24/2021   HGBA1C 5.7 (H) 11/15/2019   HGBA1C 5.7  (H) 07/03/2019   Lab Results  Component Value Date   INSULIN 11.4 09/18/2021   INSULIN 13.3 04/24/2021   INSULIN 17.5 11/15/2019   INSULIN 16.7 07/03/2019   Lab Results  Component Value Date   TSH 1.120 04/24/2021   Lab Results  Component Value Date   CHOL 190 04/24/2021   HDL 80 04/24/2021   LDLCALC 96 04/24/2021   TRIG 74 04/24/2021   CHOLHDL 3.4 11/15/2019   Lab Results  Component Value Date   VD25OH 42.9 09/18/2021   VD25OH 31.0 04/24/2021   VD25OH 39.1 11/15/2019   Lab Results  Component Value Date   WBC 4.4 04/24/2021   HGB 13.5 04/24/2021   HCT 41.5 04/24/2021   MCV 90 04/24/2021   PLT 270 04/24/2021    Attestation Statements:   Reviewed by clinician on day of visit: allergies, medications, problem list, medical history, surgical history, family history, social history, and previous encounter notes.  I, Kathlene November, BS, CMA, am acting as transcriptionist for [employer], DO.   I have reviewed the above documentation for accuracy and completeness, and I agree with the above. Marjory Sneddon, D.O.  The [place] was signed into law in 2016 which includes the topic of electronic health records.  This provides immediate access to information in MyChart.  This includes consultation notes, operative notes, office notes, lab results and pathology reports.  If you have any questions about what you read please let us know at your next visit so we can discuss your concerns and take corrective action if need be.  We are right here with you.

## 2021-12-25 ENCOUNTER — Other Ambulatory Visit: Payer: Self-pay | Admitting: Neurology

## 2021-12-26 ENCOUNTER — Other Ambulatory Visit: Payer: Self-pay | Admitting: Neurology

## 2022-01-06 ENCOUNTER — Ambulatory Visit: Payer: 59 | Admitting: Family Medicine

## 2022-01-06 VITALS — BP 134/82 | HR 76 | Temp 98.2°F | Resp 97 | Wt 229.0 lb

## 2022-01-06 DIAGNOSIS — M25522 Pain in left elbow: Secondary | ICD-10-CM

## 2022-01-06 NOTE — Progress Notes (Signed)
   Subjective:    Patient ID: Amber Parrish, female    DOB: [birth year], 51 y.o.   MRN: [id number]  HPI She states that yesterday while working with her dog he moved quickly causing her left hand to ulnarly deviate very quickly in the prone position causing pain in the medial epicondyle area.  She said she felt a popping sensation there.  She also noted some swelling in her wrist area.  This is interfering with her ability to do her ADLs.  She is having difficulty with movement elbow wrist especially unable to type.   Review of Systems     Objective:   Physical Exam Exam of the left elbow shows tenderness to palpation over the medial epicondyle defect noted.  Normal motion of fingers.  Slight discomfort in the carpal area normal motion       Assessment & Plan:  Left elbow pain - Plan: Ambulatory referral to Orthopedic Surgery As to options with her concerning conservative care of ice rest and NSAID versus x-ray/ultrasound of that area to more fully evaluate the extensor mechanism.

## 2022-01-07 ENCOUNTER — Other Ambulatory Visit (INDEPENDENT_AMBULATORY_CARE_PROVIDER_SITE_OTHER): Payer: Self-pay | Admitting: Family Medicine

## 2022-01-07 DIAGNOSIS — R7303 Prediabetes: Secondary | ICD-10-CM

## 2022-01-14 ENCOUNTER — Other Ambulatory Visit: Payer: Self-pay

## 2022-01-14 DIAGNOSIS — M25522 Pain in left elbow: Secondary | ICD-10-CM

## 2022-01-15 ENCOUNTER — Encounter (INDEPENDENT_AMBULATORY_CARE_PROVIDER_SITE_OTHER): Payer: Self-pay | Admitting: Family Medicine

## 2022-01-15 ENCOUNTER — Ambulatory Visit (INDEPENDENT_AMBULATORY_CARE_PROVIDER_SITE_OTHER): Payer: 59 | Admitting: Family Medicine

## 2022-01-15 VITALS — BP 111/65 | HR 75 | Temp 97.9°F | Ht 64.0 in | Wt 223.0 lb

## 2022-01-15 DIAGNOSIS — Z6838 Body mass index (BMI) 38.0-38.9, adult: Secondary | ICD-10-CM

## 2022-01-15 DIAGNOSIS — Z9189 Other specified personal risk factors, not elsewhere classified: Secondary | ICD-10-CM

## 2022-01-15 DIAGNOSIS — E559 Vitamin D deficiency, unspecified: Secondary | ICD-10-CM

## 2022-01-15 DIAGNOSIS — E669 Obesity, unspecified: Secondary | ICD-10-CM

## 2022-01-15 DIAGNOSIS — R7303 Prediabetes: Secondary | ICD-10-CM | POA: Diagnosis not present

## 2022-01-15 DIAGNOSIS — E66813 Obesity, class 3: Secondary | ICD-10-CM

## 2022-01-15 MED ORDER — VITAMIN D (ERGOCALCIFEROL) 1.25 MG (50000 UNIT) PO CAPS: 50000.0000 [IU] | ORAL_CAPSULE | ORAL | 0 refills | Status: DC

## 2022-01-15 MED ORDER — METFORMIN HCL 500 MG PO TABS: ORAL_TABLET | ORAL | 0 refills | Status: DC

## 2022-01-16 ENCOUNTER — Ambulatory Visit: Payer: Self-pay

## 2022-01-16 ENCOUNTER — Ambulatory Visit (INDEPENDENT_AMBULATORY_CARE_PROVIDER_SITE_OTHER): Payer: 59 | Admitting: Sports Medicine

## 2022-01-16 ENCOUNTER — Ambulatory Visit (INDEPENDENT_AMBULATORY_CARE_PROVIDER_SITE_OTHER): Payer: 59

## 2022-01-16 ENCOUNTER — Encounter: Payer: Self-pay | Admitting: Sports Medicine

## 2022-01-16 VITALS — Ht 68.0 in | Wt 223.0 lb

## 2022-01-16 DIAGNOSIS — S56512A Strain of other extensor muscle, fascia and tendon at forearm level, left arm, initial encounter: Secondary | ICD-10-CM | POA: Diagnosis not present

## 2022-01-16 DIAGNOSIS — M25522 Pain in left elbow: Secondary | ICD-10-CM

## 2022-01-16 IMAGING — MR MR HEAD WO/W CM
12 series · 48 of 48 positions shown · IV contrast (multihance)
Comparison: Head MRV 02/24/2021.

CLINICAL DATA: Chronic headaches for 2 months.

EXAM:
MRI HEAD WITHOUT AND WITH CONTRAST
TECHNIQUE: Multiplanar, multiecho pulse sequences of the brain and surrounding
structures were obtained without and with intravenous contrast.
CONTRAST:  20mL MULTIHANCE GADOBENATE DIMEGLUMINE 529 MG/ML IV SOLN

[Series 2: T1 · sagittal · 5.0mm · 0.45mm/px · 1 of 25 slices shown]
[im 1/25]
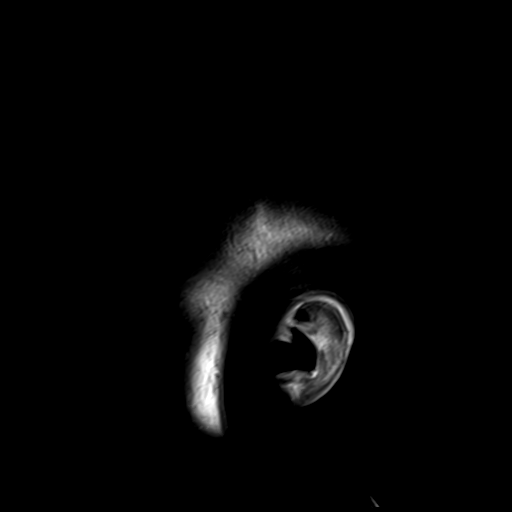

[Series 3: ax ep2d_diff_3 · axial · 3.0mm · 1.80mm/px · z∈[-94,+68]mm · 5 of 108 slices shown]
[im 1/108]
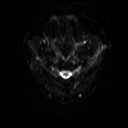
[im 27/108]
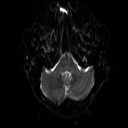
[im 54/108]
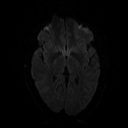
[im 81/108]
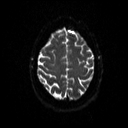
[im 108/108]
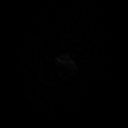

[Series 4: ax ep2d_diff_3_adc · axial · 3.0mm · 1.80mm/px · z∈[-94,+68]mm · 3 of 55 slices shown]
[im 1/55]
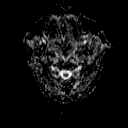
[im 28/55]
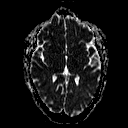
[im 55/55]
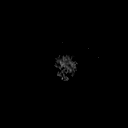

[Series 5: cor ep2d_diff · coronal · 5.0mm · 1.77mm/px · 4 of 59 slices shown]
[im 1/59]
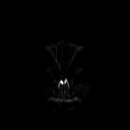
[im 20/59]
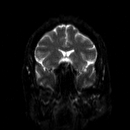
[im 39/59]
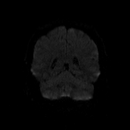
[im 59/59]
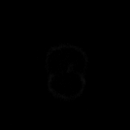

[Series 6: cor ep2d_diff_adc · coronal · 5.0mm · 1.77mm/px · 2 of 30 slices shown]
[im 1/30]
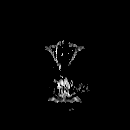
[im 30/30]
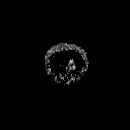

[Series 8: swi_images · axial · 2.0mm · 0.98mm/px · z∈[-90,+68]mm · 5 of 80 slices shown]
[im 1/80]
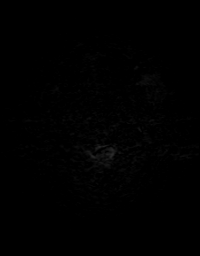
[im 20/80]
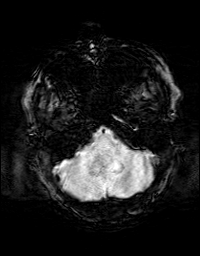
[im 40/80]
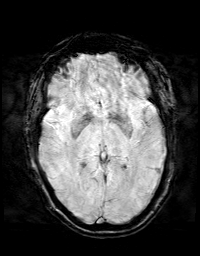
[im 60/80]
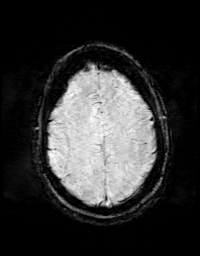
[im 80/80]
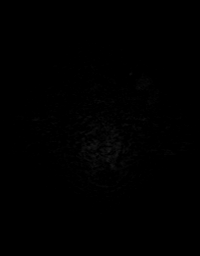

[Series 9: FLAIR · axial · 3.0mm · 0.43mm/px · z∈[-87,+65]mm · 2 of 40 slices shown]
[im 1/40]
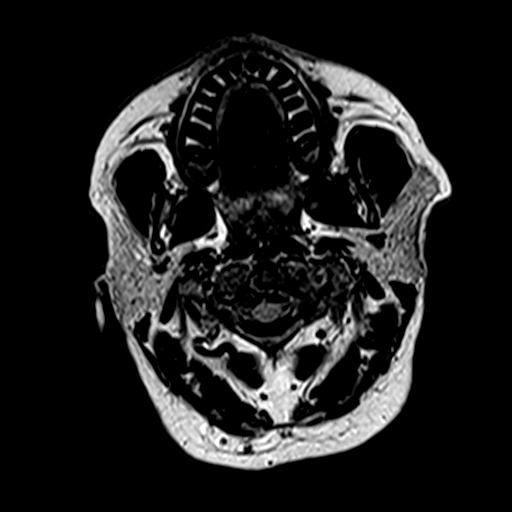
[im 40/40]
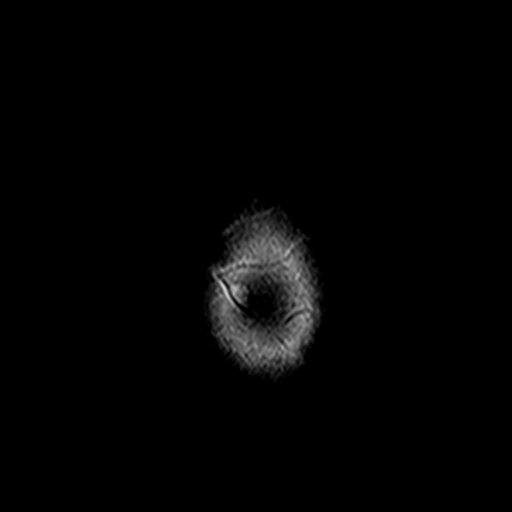

[Series 10: T2 · axial · 5.0mm · 0.65mm/px · z∈[-95,+73]mm · 2 of 29 slices shown (1 of 2)]
[im 1/29]
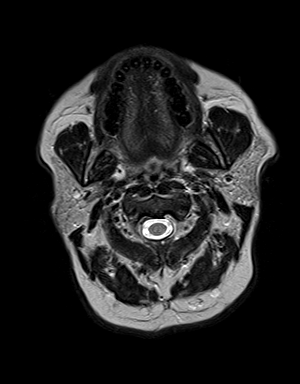
[im 29/29]
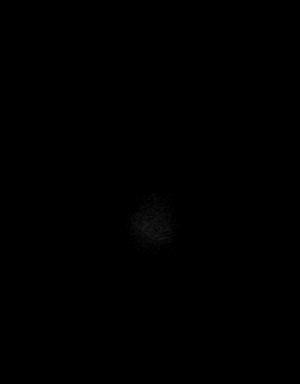

[Series 11: t1_mpr_tra · axial · 1.0mm · 0.72mm/px · z∈[-91,+68]mm · 10 of 160 slices shown]
[im 1/160]
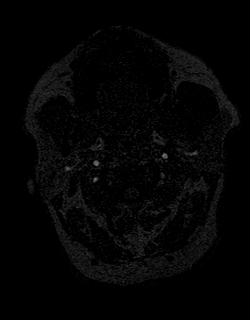
[im 18/160]
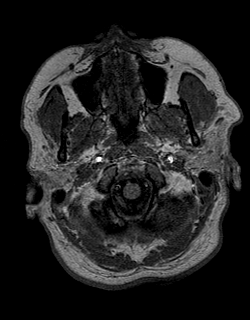
[im 36/160]
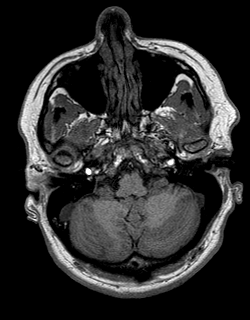
[im 54/160]
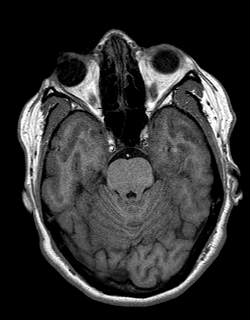
[im 71/160]
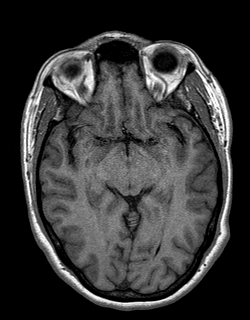
[im 89/160]
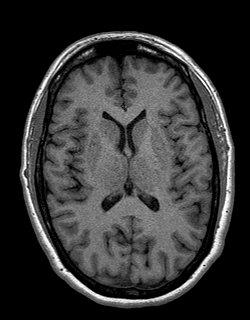
[im 107/160]
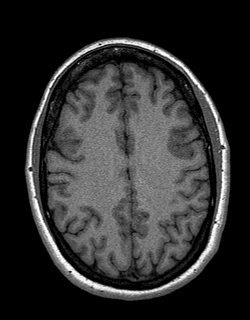
[im 124/160]
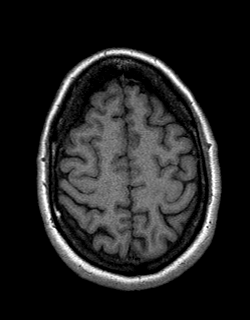
[im 142/160]
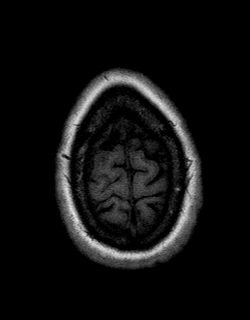
[im 160/160]
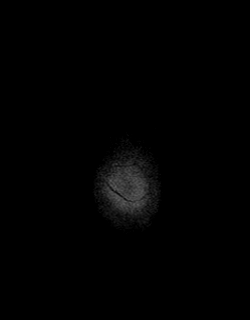

[Series 12: T2 · coronal · 5.0mm · 0.43mm/px · 2 of 28 slices shown (2 of 2)]
[im 1/28]
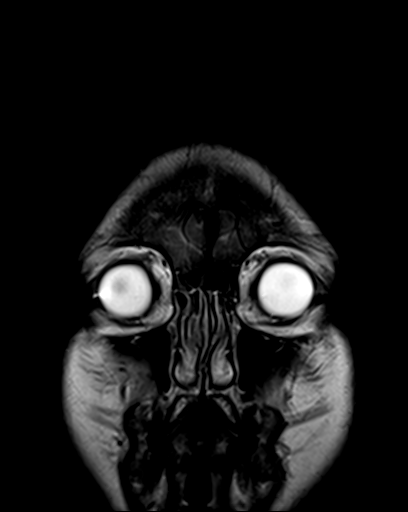
[im 28/28]
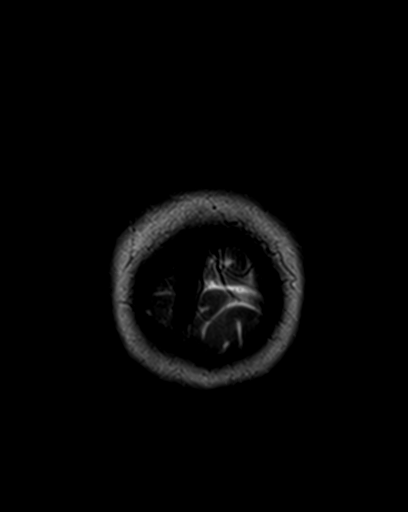

[Series 13: post t1_mpr_tra · axial · 1.0mm · 0.72mm/px · z∈[-91,+68]mm · 10 of 160 slices shown]
[im 1/160]
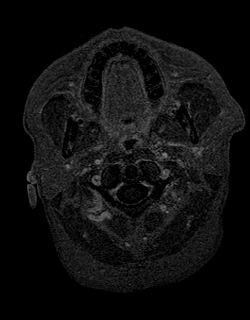
[im 18/160]
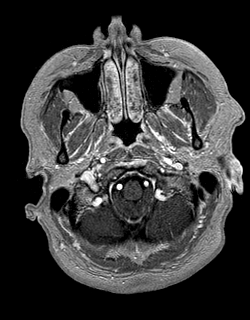
[im 36/160]
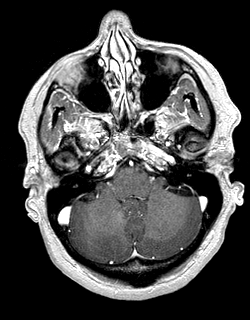
[im 54/160]
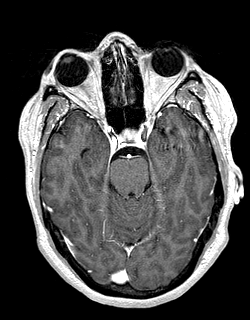
[im 71/160]
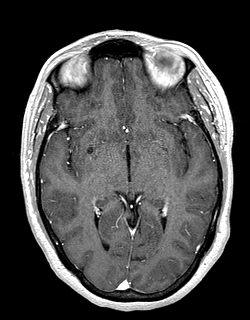
[im 89/160]
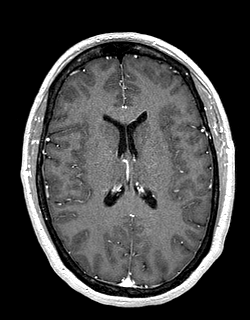
[im 107/160]
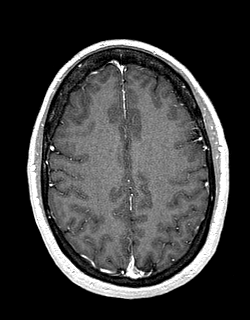
[im 124/160]
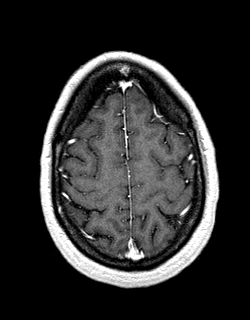
[im 142/160]
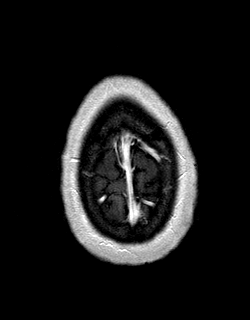
[im 160/160]
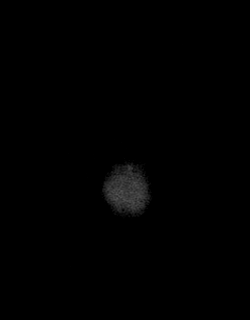

[Series 14: T1 post-contrast · coronal · 5.0mm · 0.72mm/px · 2 of 27 slices shown]
[im 1/27]
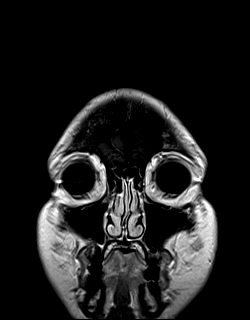
[im 27/27]
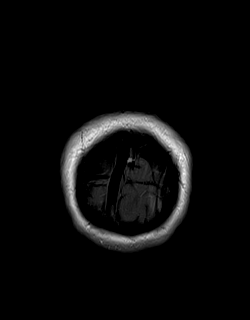

[48 of 48 positions shown; findings below may reference images not displayed]

FINDINGS: Brain: There is no evidence of an acute infarct, intracranial
hemorrhage, mass, midline shift, or extra-axial fluid collection.
The ventricles and sulci are normal. The cerebellar tonsils are
normally position. The brain is normal in signal. No abnormal
enhancement is identified.

Vascular: Major intracranial vascular flow voids are preserved.

Skull and upper cervical spine: Unremarkable bone marrow signal.

Sinuses/Orbits: Unremarkable orbits. Mild mucosal thickening in the
paranasal sinuses. Clear mastoid air cells.

Other: None.
IMPRESSION: Unremarkable appearance of the brain.

## 2022-01-16 NOTE — Progress Notes (Signed)
Amber Parrish - 51 y.o. female MRN [id number]  Date of birth: January 07, 1971  Office Visit Note: Visit Date: 01/16/2022 PCP: Denita Lung, MD Referred by: Denita Lung, MD  Subjective: Chief Complaint  Patient presents with   Left Elbow - Pain   HPI: Amber Parrish is a very pleasant 51 y.o. female who presents today for left elbow pain and injury.  Seeing her at the request of Dr. Jill Alexanders.  Patient was walking her dog about 2 weeks ago when the dog torqued her she felt a sharp pain that extended from the forearm into the lateral elbow.  Felt like she heard a pop.  She denied any bruising or swelling, but states the lateral elbow feels somewhat somewhat swollen. She has rested, taken over-the-counter anti-inflammatories and her pain has improved at least 50%.  She denies any numbness or tingling.  Pain is located over the extensor compartment of the elbow and radiates into the wrist.  Some pain with grip strength as well as extension of the wrist and pronation/supination.  No previous elbow injuries.  Pertinent ROS were reviewed with the patient and found to be negative unless otherwise specified above in HPI.   Assessment & Plan: Visit Diagnoses:  1. Partial tear of common extensor tendon of left elbow   2. Pain in left elbow    Plan: Had a discussion with Amber Parrish today regarding her elbow injury, which does show a partial tear of the common extensor tendon.  She has had improvement over the last 2 weeks with rest and over-the-counter anti-inflammatories. Discussed treatment options such as low-dose oral prednisone versus oral NSAIDs to help with inflammation but she would like to hold off on this for now as her pain is improving.  She may use ice and/or topical Voltaren gel around the painful area.  We will get her started on a home rehab protocol for the lateral epicondyle and common extensor origin.  Did recommend a counterforce strap to be used with activity.  We will see  how she does over the next 6 weeks, if she is not finding further benefit she may present for reevaluation. Additional treatment options that could be consideration of extracorporeal shockwave therapy, nitropatch protocol, injection therapy, or PRP although I am hopeful she will improve with conservative treatment and rehab.  Follow-up: PRN --> if not improving in 4-6 weeks, will follow-up    Meds & Orders: No orders of the defined types were placed in this encounter.   Orders Placed This Encounter  Procedures   XR Elbow Complete Left (3+View)   [place] Extrem Up Left Ltd     Procedures: No procedures performed      Clinical History: No specialty comments available.  She reports that she has never smoked. She has never used smokeless tobacco.  Recent Labs    04/24/21 0906 09/18/21 0848  HGBA1C 5.7* 5.5    Objective:   Vital Signs: Ht 5\' 8"  (1.727 m)   Wt 223 lb (101.2 kg)   BMI 33.91 kg/m   Physical Exam  Gen: Well-appearing, in no acute distress; non-toxic CV: Regular Rate. Well-perfused. Warm.  Resp: Breathing unlabored on room air; no wheezing. Psych: Fluid speech in conversation; appropriate affect; normal thought process Neuro: Sensation intact throughout. No gross coordination deficits.   Ortho Exam - Left elbow: Positive TTP over the lateral epicondyle and just proximal to the bony insertion.  Examination shows some very mild soft tissue swelling around this area, no  joint effusion or ecchymosis.  There is full active and passive range of motion about the elbow.  Full range of motion about the wrist, although pain with extension and resisted wrist extension. Full grip strength. + Pain with resisted third finger extension, resisted supination, pronation intact.  No varus or valgus instability.  Neurovascular intact distally.  Imaging:  [place] Extrem Up Left Ltd  Result Date: 01/16/2022 [facility] Limited elbow ultrasound performed, left Short and long axis evaluation of the  lateral epicondyle and common extensor extension tendon origin was evaluated.  Long axis evaluation shows a partial tear that spans about 50% of the tendon width at the insertion about 1 cm proximal to the lateral epicondyle.  There is associated hyperemia and hypoechoic fluid surrounding this tear.  No cortical irregularity of the lateral epicondyles or radial head.  Scanning proximally up the common extensor tendon shows some mild inflammatory change without evidence of tearing in the belly of the tendon.   XR Elbow Complete Left (3+View)  Result Date: 01/16/2022 3 views of the left elbow including AP, oblique and lateral films were ordered and reviewed by myself.  X-rays show elbow joint with proper alignment.  There is a small spur off the sublime tubercle, otherwise no acute fracture or bony abnormality noted.   Past Medical/Family/Surgical/Social History: Medications & Allergies reviewed per EMR, new medications updated. Patient Active Problem List   Diagnosis Date Noted   At risk for activity intolerance 01/15/2022   At risk for diabetes mellitus 10/14/2021   Elevated BP without diagnosis of hypertension 06/05/2021   Chronic nonintractable headache 06/05/2021   At risk for heart disease 05/22/2020   Mood disorder (HCC), with emotional eating 05/07/2020   Depression 02/21/2020   At risk for impaired metabolic function 02/21/2020   Vitamin D deficiency 10/12/2019   Prediabetes 10/12/2019   Gastroesophageal reflux disease 02/08/2019   Class 3 severe obesity with serious comorbidity and body mass index (BMI) of 40.0 to 44.9 in adult [facility]) 04/06/2018   Family history of diabetes mellitus 04/06/2018   Past Medical History:  Diagnosis Date   Bilateral hand swelling    Headache    Heartburn    Joint pain    Muscle pain    Other fatigue    Overweight    Shortness of breath on exertion    Family History  Problem Relation Age of Onset   Diabetes Mother    Hypertension Mother     Obesity Mother    Diabetes Father    Alcohol abuse Father    Arthritis Maternal Grandmother    Cancer Maternal Grandmother    Diabetes Maternal Grandmother    Hypertension Maternal Grandmother    Past Surgical History:  Procedure Laterality Date   ANTERIOR CRUCIATE LIGAMENT REPAIR  08/1998   CESAREAN SECTION  08/18/2001   Social History   Occupational History   Occupation: Agricultural engineer: [employer]  Tobacco Use   Smoking status: Never   Smokeless tobacco: Never  Vaping Use   Vaping Use: Never used  Substance and Sexual Activity   Alcohol use: Yes    Alcohol/week: 7.0 standard drinks of alcohol    Types: 7 Glasses of wine per week   Drug use: Never   Sexual activity: Yes    Partners: Male    Birth control/protection: Surgical    Comment: Husband had vasectomy

## 2022-01-16 NOTE — Progress Notes (Signed)
Incident with dog; trying to give the dog medicine and the dog jerked her arm and she heard and felt a loud pop.  This happened 2 weeks ago; pain has subsided some; has some weakness in the arm.

## 2022-01-16 NOTE — Patient Instructions (Addendum)
Shayley, it was great to meet you today, be sure to give Jenny Reichmann a hard time for me when you see him next.  Today, we discussed your aggravation/partial tear of the common extensor tendon near the elbow. This is a form of lateral epicondylitis (or tennis elbow).  Things for you to do:  - Ice, Topical voltaren gel at the painful area near elbow - You will perform the home exercises, shoot for once a day every day for the next month  -You can try a counterforce strap ("tennis elbow strap") which should be placed around the arm 2 inches lower than where your pain is near the bone.  Wear this if you are getting back into physical activity like volleyball or even when you are typing in the office. Jerilynn Som or [employer]  https://www.googleadservices.com/pagead/aclk?[email or web address]=DChcSEwjOo-[email or web address]&ae=2&gclid=[email or web address]_BwE&ohost=www.google.com&cid=CAASJeRoQtx7nkekHiRh_gXmlwqxoOiEe0rWmyyabgU1hMcS_LblfI0&sig=[email or web address]_[id number]&ctype=46&q=&ved=[email or web address]&adurl=  You will follow-up with me in 6 weeks if you are not finding benefit.  If you have any further questions, please give the clinic a call [phone].  Elba Barman, DO

## 2022-01-17 LAB — LIPID PANEL WITH LDL/HDL RATIO
Cholesterol, Total: 186 mg/dL (ref 100–199)
HDL: 70 mg/dL (ref >39)
LDL Chol Calc (NIH): 102 mg/dL — ABNORMAL HIGH (ref 0–99)
LDL/HDL Ratio: 1.5 ratio (ref 0.0–3.2)
Triglycerides: 78 mg/dL (ref 0–149)
VLDL Cholesterol Cal: 14 mg/dL (ref 5–40)

## 2022-01-17 LAB — VITAMIN B12: Vitamin B-12: 816 pg/mL (ref 232–1245)

## 2022-01-17 LAB — VITAMIN D 25 HYDROXY (VIT D DEFICIENCY, FRACTURES): Vit D, 25-Hydroxy: 46.4 ng/mL (ref 30.0–100.0)

## 2022-01-17 LAB — INSULIN, RANDOM: INSULIN: 15 u[IU]/mL (ref 2.6–24.9)

## 2022-01-21 NOTE — Progress Notes (Signed)
Chief Complaint:   OBESITY Amber Parrish is here to discuss her progress with her obesity treatment plan along with follow-up of her obesity related diagnoses. Amber Parrish is on the Category 3 Plan and states she is following her eating plan approximately 75% of the time. Amber Parrish states she is not currently exercising.  Today's visit was #: 13 Starting weight: 246 lbs Starting date: 04/24/2021 Today's weight: 223 lbs Today's date: 01/15/2022 Total lbs lost to date: 23 Total lbs lost since last in-office visit: 7  Interim History: Amber Parrish says, "I'm surprised I lost weight. I haven't been able to eat healthy because I injured my left elbow." Pt has been eating more proteins and less snacking. She lost 8 lbs in fat mass and gained in muscle mass. She is really focusing on drinking her water.  Subjective:   1. Pre-diabetes We increased Amber Parrish's Metformin to twice a day at last OV. She feels she has done significantly less snacking and is able to eat all foods on plan.  2. Vitamin D deficiency She is currently taking prescription vitamin D 50,000 IU each week. She denies nausea, vomiting or muscle weakness.  3. At risk for activity intolerance Amber Parrish is at risk for exercise intolerance due to current activity.  Assessment/Plan:  No orders of the defined types were placed in this encounter.   Medications Discontinued During This Encounter  Medication Reason   Vitamin D, Ergocalciferol, (DRISDOL) 1.25 MG (50000 UNIT) CAPS capsule Reorder   metFORMIN (GLUCOPHAGE) 500 MG tablet Reorder     Meds ordered this encounter  Medications   metFORMIN (GLUCOPHAGE) 500 MG tablet    Sig: 1 po with lunch  and dinner daily    Dispense:  60 tablet    Refill:  0    30 d supply;  ** OV for RF **   Do not send RF request   Vitamin D, Ergocalciferol, (DRISDOL) 1.25 MG (50000 UNIT) CAPS capsule    Sig: Take 1 capsule (50,000 Units total) by mouth every 7 (seven) days.    Dispense:  4 capsule     Refill:  0     1. Pre-diabetes Amber Parrish will continue to work on weight loss, exercise, and decreasing simple carbohydrates to help decrease the risk of diabetes. Obtain fasting labs today.  Refill- metFORMIN (GLUCOPHAGE) 500 MG tablet; 1 po with lunch  and dinner daily  Dispense: 60 tablet; Refill: 0  2. Vitamin D deficiency Low Vitamin D level contributes to fatigue and are associated with obesity, breast, and colon cancer. She agrees to continue to take prescription Vitamin D @50 ,000 IU every week and will follow-up for routine testing of Vitamin D, at least 2-3 times per year to avoid over-replacement.  Refill- Vitamin D, Ergocalciferol, (DRISDOL) 1.25 MG (50000 UNIT) CAPS capsule; Take 1 capsule (50,000 Units total) by mouth every 7 (seven) days.  Dispense: 4 capsule; Refill: 0  3. At risk for activity intolerance Amber Parrish was given approximately 9 minutes of counseling today regarding her increased risk for exercise intolerance.  We discussed patient's specific personal and medical issues that raise our concern.  She was advised of strategies to prevent injury and ways to improve her cardiopulmonary fitness levels slowly over time.  We additionally discussed various fitness trackers and smart phone apps to help motivate the patient to stay on track.   4. Obesity, current BMI 38.3 Amber Parrish is currently in the action stage of change. As such, her goal is to continue with weight loss efforts.  She has agreed to the Category 3 Plan.   Exercise goals:  As is  Behavioral modification strategies: increasing lean protein intake, decreasing simple carbohydrates, and planning for success.  Amber Parrish has agreed to follow-up with our clinic in 2 weeks. She was informed of the importance of frequent follow-up visits to maximize her success with intensive lifestyle modifications for her multiple health conditions.   Obtained fasting labs today, and we will review them in full at next OV.  Objective:    Blood pressure 111/65, pulse 75, temperature 97.9 F (36.6 C), height 5\' 4"  (1.626 m), weight 223 lb (101.2 kg), SpO2 100 %. Body mass index is 38.28 kg/m.  General: Cooperative, alert, well developed, in no acute distress. HEENT: Conjunctivae and lids unremarkable. Cardiovascular: Regular rhythm.  Lungs: Normal work of breathing. Neurologic: No focal deficits.   Lab Results  Component Value Date   CREATININE 0.66 04/24/2021   BUN 14 04/24/2021   NA 139 04/24/2021   K 4.5 04/24/2021   CL 102 04/24/2021   CO2 24 04/24/2021   Lab Results  Component Value Date   ALT 23 04/24/2021   AST 21 04/24/2021   ALKPHOS 77 04/24/2021   BILITOT 0.4 04/24/2021   Lab Results  Component Value Date   HGBA1C 5.5 09/18/2021   HGBA1C 5.7 (H) 04/24/2021   HGBA1C 5.7 (H) 11/15/2019   HGBA1C 5.7 (H) 07/03/2019   Lab Results  Component Value Date   INSULIN 15.0 01/15/2022   INSULIN 11.4 09/18/2021   INSULIN 13.3 04/24/2021   INSULIN 17.5 11/15/2019   INSULIN 16.7 07/03/2019   Lab Results  Component Value Date   TSH 1.120 04/24/2021   Lab Results  Component Value Date   CHOL 186 01/15/2022   HDL 70 01/15/2022   LDLCALC 102 (H) 01/15/2022   TRIG 78 01/15/2022   CHOLHDL 3.4 11/15/2019   Lab Results  Component Value Date   VD25OH 46.4 01/15/2022   VD25OH 42.9 09/18/2021   VD25OH 31.0 04/24/2021   Lab Results  Component Value Date   WBC 4.4 04/24/2021   HGB 13.5 04/24/2021   HCT 41.5 04/24/2021   MCV 90 04/24/2021   PLT 270 04/24/2021    Attestation Statements:   Reviewed by clinician on day of visit: allergies, medications, problem list, medical history, surgical history, family history, social history, and previous encounter notes.  I, 06/22/2021, BS, CMA, am acting as transcriptionist for Kyung Rudd, DO.   I have reviewed the above documentation for accuracy and completeness, and I agree with the above. [employer], D.O.  The 21st Century Cures  Act was signed into law in 2016 which includes the topic of electronic health records.  This provides immediate access to information in MyChart.  This includes consultation notes, operative notes, office notes, lab results and pathology reports.  If you have any questions about what you read please let 2017 know at your next visit so we can discuss your concerns and take corrective action if need be.  We are right here with you.

## 2022-01-25 ENCOUNTER — Other Ambulatory Visit: Payer: Self-pay | Admitting: Neurology

## 2022-01-27 ENCOUNTER — Encounter: Payer: Self-pay | Admitting: Internal Medicine

## 2022-01-27 ENCOUNTER — Encounter (INDEPENDENT_AMBULATORY_CARE_PROVIDER_SITE_OTHER): Payer: Self-pay | Admitting: Family Medicine

## 2022-01-27 ENCOUNTER — Other Ambulatory Visit (INDEPENDENT_AMBULATORY_CARE_PROVIDER_SITE_OTHER): Payer: Self-pay | Admitting: Family Medicine

## 2022-01-27 DIAGNOSIS — R7303 Prediabetes: Secondary | ICD-10-CM

## 2022-01-28 ENCOUNTER — Other Ambulatory Visit: Payer: Self-pay | Admitting: Neurology

## 2022-02-09 ENCOUNTER — Encounter: Payer: Self-pay | Admitting: Internal Medicine

## 2022-02-10 ENCOUNTER — Encounter: Payer: 59 | Admitting: Family Medicine

## 2022-02-12 ENCOUNTER — Ambulatory Visit (INDEPENDENT_AMBULATORY_CARE_PROVIDER_SITE_OTHER): Payer: 59 | Admitting: Family Medicine

## 2022-02-12 ENCOUNTER — Encounter (INDEPENDENT_AMBULATORY_CARE_PROVIDER_SITE_OTHER): Payer: Self-pay | Admitting: Family Medicine

## 2022-02-12 VITALS — BP 128/76 | HR 74 | Temp 98.2°F | Ht 64.0 in | Wt 222.0 lb

## 2022-02-12 DIAGNOSIS — Z6841 Body Mass Index (BMI) 40.0 and over, adult: Secondary | ICD-10-CM

## 2022-02-12 DIAGNOSIS — E669 Obesity, unspecified: Secondary | ICD-10-CM | POA: Diagnosis not present

## 2022-02-12 DIAGNOSIS — Z6838 Body mass index (BMI) 38.0-38.9, adult: Secondary | ICD-10-CM

## 2022-02-12 DIAGNOSIS — R7303 Prediabetes: Secondary | ICD-10-CM | POA: Diagnosis not present

## 2022-02-12 DIAGNOSIS — E559 Vitamin D deficiency, unspecified: Secondary | ICD-10-CM

## 2022-02-12 MED ORDER — METFORMIN HCL 500 MG PO TABS: ORAL_TABLET | ORAL | 0 refills | Status: DC

## 2022-02-12 MED ORDER — VITAMIN D (ERGOCALCIFEROL) 1.25 MG (50000 UNIT) PO CAPS: 50000.0000 [IU] | ORAL_CAPSULE | ORAL | 0 refills | Status: DC

## 2022-02-24 NOTE — Progress Notes (Signed)
Chief Complaint:   OBESITY Amber Parrish is here to discuss her progress with her obesity treatment plan along with follow-up of her obesity related diagnoses. Amber Parrish is on the Category 3 Plan and states she is following her eating plan approximately 80% of the time. Amber Parrish states she is walking/biking 30-35 minutes 32 times per week.  Today's visit was #: 14 Starting weight: 246 lbs Starting date: 04/24/2021 Today's weight: 222 lbs Today's date: 02/12/2022 Total lbs lost to date: 24 lbs Total lbs lost since last in-office visit: 1  Interim History: Amber Parrish's son was here for a week. She ate out more but made healthier choices and practiced portion control. She is back to walking now as well.   Subjective:   1. Pre-diabetes Amber Parrish has a diagnosis of prediabetes based on her elevated HgA1c and was informed this puts her at greater risk of developing diabetes. She continues to work on diet and exercise to decrease her risk of diabetes. She denies nausea or hypoglycemia.  2. Vitamin D deficiency She is currently taking prescription vitamin D 50,000 IU each week. She denies nausea, vomiting or muscle weakness.  Assessment/Plan:  No orders of the defined types were placed in this encounter.   Medications Discontinued During This Encounter  Medication Reason   metFORMIN (GLUCOPHAGE) 500 MG tablet Reorder   Vitamin D, Ergocalciferol, (DRISDOL) 1.25 MG (50000 UNIT) CAPS capsule Reorder     Meds ordered this encounter  Medications   Vitamin D, Ergocalciferol, (DRISDOL) 1.25 MG (50000 UNIT) CAPS capsule    Sig: Take 1 capsule (50,000 Units total) by mouth every 7 (seven) days.    Dispense:  4 capsule    Refill:  0   metFORMIN (GLUCOPHAGE) 500 MG tablet    Sig: 2 po with lunch and 1 with dinner daily    Dispense:  90 tablet    Refill:  0    30 d supply;  ** OV for RF **   Do not send RF request     1. Pre-diabetes We will refill Metformin 500 mg daily for 1 month with 0  refills.  -Refill metFORMIN (GLUCOPHAGE) 500 MG tablet; 2 po with lunch and 1 with dinner daily  Dispense: 90 tablet; Refill: 0  2. Vitamin D deficiency We will refill ergocalciferol 50,000 IU once a week for 1 month with 0 refills. Low Vitamin D level contributes to fatigue and are associated with obesity, breast, and colon cancer. She agrees to continue to take prescription Vitamin D @50 ,000 IU every week and will follow-up for routine testing of Vitamin D, at least 2-3 times per year to avoid over-replacement.   -Refill Vitamin D, Ergocalciferol, (DRISDOL) 1.25 MG (50000 UNIT) CAPS capsule; Take 1 capsule (50,000 Units total) by mouth every 7 (seven) days.  Dispense: 4 capsule; Refill: 0  3. Obesity, current BMI 38.2 Amber Parrish is currently in the action stage of change. As such, her goal is to continue with weight loss efforts. She has agreed to the Category 3 Plan.   Exercise goals: As is.  Behavioral modification strategies: better snacking choices and avoiding temptations. Holiday eating strategies discussed with patient and increasing protein and fiber intake.  Amber Parrish has agreed to follow-up with our clinic in 5 weeks. She was informed of the importance of frequent follow-up visits to maximize her success with intensive lifestyle modifications for her multiple health conditions.   Objective:   Blood pressure 128/76, pulse 74, temperature 98.2 F (36.8 C), height 5\' 4"  (1.626  m), weight 222 lb (100.7 kg), SpO2 100 %. Body mass index is 38.11 kg/m.  General: Cooperative, alert, well developed, in no acute distress. HEENT: Conjunctivae and lids unremarkable. Cardiovascular: Regular rhythm.  Lungs: Normal work of breathing. Neurologic: No focal deficits.   Lab Results  Component Value Date   CREATININE 0.66 04/24/2021   BUN 14 04/24/2021   NA 139 04/24/2021   K 4.5 04/24/2021   CL 102 04/24/2021   CO2 24 04/24/2021   Lab Results  Component Value Date   ALT 23 04/24/2021    AST 21 04/24/2021   ALKPHOS 77 04/24/2021   BILITOT 0.4 04/24/2021   Lab Results  Component Value Date   HGBA1C 5.5 09/18/2021   HGBA1C 5.7 (H) 04/24/2021   HGBA1C 5.7 (H) 11/15/2019   HGBA1C 5.7 (H) 07/03/2019   Lab Results  Component Value Date   INSULIN 15.0 01/15/2022   INSULIN 11.4 09/18/2021   INSULIN 13.3 04/24/2021   INSULIN 17.5 11/15/2019   INSULIN 16.7 07/03/2019   Lab Results  Component Value Date   TSH 1.120 04/24/2021   Lab Results  Component Value Date   CHOL 186 01/15/2022   HDL 70 01/15/2022   LDLCALC 102 (H) 01/15/2022   TRIG 78 01/15/2022   CHOLHDL 3.4 11/15/2019   Lab Results  Component Value Date   VD25OH 46.4 01/15/2022   VD25OH 42.9 09/18/2021   VD25OH 31.0 04/24/2021   Lab Results  Component Value Date   WBC 4.4 04/24/2021   HGB 13.5 04/24/2021   HCT 41.5 04/24/2021   MCV 90 04/24/2021   PLT 270 04/24/2021   No results found for: "IRON", "TIBC", "FERRITIN"  Attestation Statements:   Reviewed by clinician on day of visit: allergies, medications, problem list, medical history, surgical history, family history, social history, and previous encounter notes.  I, Brendell Tyus, am acting as Energy manager for [employer], DO.   I have reviewed the above documentation for accuracy and completeness, and I agree with the above. Carlye Grippe, D.O.  The 21st Century Cures Act was signed into law in 2016 which includes the topic of electronic health records.  This provides immediate access to information in MyChart.  This includes consultation notes, operative notes, office notes, lab results and pathology reports.  If you have any questions about what you read please let us know at your next visit so we can discuss your concerns and take corrective action if need be.  We are right here with you.

## 2022-03-10 ENCOUNTER — Ambulatory Visit (INDEPENDENT_AMBULATORY_CARE_PROVIDER_SITE_OTHER): Payer: 59 | Admitting: Family Medicine

## 2022-03-12 ENCOUNTER — Other Ambulatory Visit (INDEPENDENT_AMBULATORY_CARE_PROVIDER_SITE_OTHER): Payer: Self-pay | Admitting: Family Medicine

## 2022-03-12 DIAGNOSIS — R7303 Prediabetes: Secondary | ICD-10-CM

## 2022-03-17 NOTE — Progress Notes (Unsigned)
NEUROLOGY FOLLOW UP OFFICE NOTE  VIRJEAN BRECKER GQ:[id number]  Assessment/Plan:   Chronic daily headaches- semiology at this time seems consistent with tension-type headaches   Start sertraline 50mg  daily - contact me in 6 weeks with update.  She would like to avoid medications that may cause weight gain, such as nortriptyline. Take Trokendi XR 50mg  daily for one week, then STOP For rescue, she will try Excedrin or Aleve.  Limit use of pain relievers to no more than 2 days out of week to prevent risk of rebound or medication-overuse headache. Tizanidine 2mg  as needed for acute treatment.   Keep headache diary Follow up 6 months.     Subjective:  MEGHA OHLE is a 51 year old right-handed female who follows up for headaches.   UPDATE: Last visit, switched from Trokendi to sertraline. Intensity:  *** Duration:  *** Frequency:  ***  Current NSAIDS/analgesics:  none Current triptans:  none Current ergotamine:  none Current anti-emetic:  none Current muscle relaxants:  none Current Antihypertensive medications:  none Current Antidepressant medications:  sertraline 50mg  daily Current Anticonvulsant medications: none Current anti-CGRP:  none Current Vitamins/Herbal/Supplements:  none Current Antihistamines/Decongestants:  none Other therapy:  none Hormone/birth control:  none   Caffeine:  occasional coffee Diet:  Hydrates.  Watches her diet.  No soda.  She goes to the [employer] [facility] [employer]. Exercise:  not routine Depression:  no; Anxiety: stable  Other pain:  no Sleep hygiene:  wakes up more often in the middle of the night. Maybe 6 hours of sleep.  No daytime sleepiness.     HISTORY:  She began having headaches around the summer 2022.   It is a unilateral pressure occipital headache (either side) that begins as 2-3/10 in the morning and gradually spreads to the forehead at a 6-7/10 intensity late in the day.  Sometimes neck pain by end of day.  She has  photosensitivity but denies nausea, vomiting, phonophobia, autonomic symptoms, dizziness, numbness or weakness.  They occur daily.  Bright light and intense smells are triggers.  Rest and hydration help relieve it.  MRI and MRV of brain with and without contrast in November personally reviewed were unremarkable.  By late November, she started noticing blurred vision whenever concentrating/reading or using computer but denies visual obscurations.  She has not recently had an eye exam.  When headaches are more severe, she may hear a whooshing in her head.  She tried treating with ibuprofen but since ineffective, she has not taken any analgesics.  Also with some neck pain.  X-ray of cervical spine was unremarkable     Her father passed away in early Aug 09, 2020 but no new stressors by the time headaches started.  She works as an Optometrist.  Work and home life is the same.  No injuries or illness.       Past NSAIDS/analgesics:  ibuprofen Past abortive triptans:  none Past abortive ergotamine:  none Past muscle relaxants:  none Past anti-emetic:  none Past antihypertensive medications:  furosemide Past antidepressant medications:  Wellbutrin Past anticonvulsant medications:  topiramate ER Past anti-CGRP:  none Past vitamins/Herbal/Supplements:  none Past antihistamines/decongestants:  Benadryl Other past therapies:  [facility] (Dr. Glennon Mac - effective)     Family history of headache:  not that she knows.   PAST MEDICAL HISTORY: Past Medical History:  Diagnosis Date   Bilateral hand swelling    Headache    Heartburn    Joint pain    Muscle pain  Other fatigue    Overweight    Shortness of breath on exertion     MEDICATIONS: Current Outpatient Medications on File Prior to Visit  Medication Sig Dispense Refill   metFORMIN (GLUCOPHAGE) 500 MG tablet 2 po with lunch and 1 with dinner daily 90 tablet 0   sertraline (ZOLOFT) 50 MG tablet TAKE 1 TABLET BY MOUTH EVERY DAY 90 tablet 1    tiZANidine (ZANAFLEX) 2 MG tablet TAKE 1 TABLET BY MOUTH THREE TIMES A DAY AS NEEDED 270 tablet 1   Topiramate ER (TROKENDI XR) 50 MG CP24 TAKE 50 MG BY MOUTH AT BEDTIME. 90 capsule 0   Vitamin D, Ergocalciferol, (DRISDOL) 1.25 MG (50000 UNIT) CAPS capsule Take 1 capsule (50,000 Units total) by mouth every 7 (seven) days. 4 capsule 0   No current facility-administered medications on file prior to visit.    ALLERGIES: Allergies  Allergen Reactions   Penicillins     FAMILY HISTORY: Family History  Problem Relation Age of Onset   Diabetes Mother    Hypertension Mother    Obesity Mother    Diabetes Father    Alcohol abuse Father    Arthritis Maternal Grandmother    Cancer Maternal Grandmother    Diabetes Maternal Grandmother    Hypertension Maternal Grandmother       Objective:  *** General: No acute distress.  Patient appears well-groomed.   Head:  Normocephalic/atraumatic Eyes:  Fundi examined but not visualized Neck: supple, no paraspinal tenderness, full range of motion Heart:  Regular rate and rhythm Neurological Exam: alert and oriented to person, place, and time.  Speech fluent and not dysarthric, language intact.  CN II-XII intact. Bulk and tone normal, muscle strength 5/5 throughout.  Sensation to light touch intact.  Deep tendon reflexes 2+ throughout, toes downgoing.  Finger to nose testing intact.  Gait normal, Romberg negative.   Shon Millet, DO  CC: ***

## 2022-03-19 ENCOUNTER — Ambulatory Visit: Payer: 59 | Admitting: Neurology

## 2022-03-19 ENCOUNTER — Encounter: Payer: Self-pay | Admitting: Neurology

## 2022-03-19 VITALS — BP 135/74 | HR 76 | Ht 64.0 in | Wt 229.2 lb

## 2022-03-19 DIAGNOSIS — R519 Headache, unspecified: Secondary | ICD-10-CM

## 2022-03-19 MED ORDER — VENLAFAXINE HCL ER 37.5 MG PO CP24: 37.5000 mg | ORAL_CAPSULE | Freq: Every day | ORAL | 5 refills | Status: DC

## 2022-03-19 MED ORDER — RIZATRIPTAN BENZOATE 10 MG PO TABS: ORAL_TABLET | ORAL | 5 refills | Status: DC

## 2022-03-19 NOTE — Patient Instructions (Signed)
Take 1/2 tablet of sertraline daily for one week, then stop Then start venlafaxine ER 37.5mg  every morning with breakfast. If no improvement in 4 weeks, contact me and I will increase dose Take rizatriptan earliest onset of headache.  May repeat after 2 hours.  Maximum 2 tablets in 24 hours.  Limit use of pain relievers to no more than 2 days out of week to prevent risk of rebound or medication-overuse headache. Follow up in 4-5 months.

## 2022-04-08 ENCOUNTER — Ambulatory Visit (INDEPENDENT_AMBULATORY_CARE_PROVIDER_SITE_OTHER): Payer: 59 | Admitting: Family Medicine

## 2022-04-08 ENCOUNTER — Encounter (INDEPENDENT_AMBULATORY_CARE_PROVIDER_SITE_OTHER): Payer: Self-pay | Admitting: Family Medicine

## 2022-04-08 VITALS — BP 140/78 | HR 72 | Temp 98.2°F | Ht 64.0 in | Wt 223.4 lb

## 2022-04-08 DIAGNOSIS — R7303 Prediabetes: Secondary | ICD-10-CM | POA: Diagnosis not present

## 2022-04-08 DIAGNOSIS — Z6841 Body Mass Index (BMI) 40.0 and over, adult: Secondary | ICD-10-CM

## 2022-04-08 DIAGNOSIS — E669 Obesity, unspecified: Secondary | ICD-10-CM | POA: Diagnosis not present

## 2022-04-08 DIAGNOSIS — Z6838 Body mass index (BMI) 38.0-38.9, adult: Secondary | ICD-10-CM

## 2022-04-08 DIAGNOSIS — E559 Vitamin D deficiency, unspecified: Secondary | ICD-10-CM

## 2022-04-08 MED ORDER — VITAMIN D (ERGOCALCIFEROL) 1.25 MG (50000 UNIT) PO CAPS: 50000.0000 [IU] | ORAL_CAPSULE | ORAL | 0 refills | Status: DC

## 2022-04-08 MED ORDER — METFORMIN HCL 500 MG PO TABS: ORAL_TABLET | ORAL | 0 refills | Status: DC

## 2022-04-12 ENCOUNTER — Other Ambulatory Visit: Payer: Self-pay | Admitting: Neurology

## 2022-04-23 NOTE — Progress Notes (Signed)
Chief Complaint:   OBESITY Amber Parrish is here to discuss her progress with her obesity treatment plan along with follow-up of her obesity related diagnoses. Amber Parrish is on the Category 3 Plan and states she is following her eating plan approximately 65% of the time. Amber Parrish states she is not exercising.  Today's visit was #: 15 Starting weight: 246 lbs Starting date: 04/24/2021 Today's weight: 223 lbs Today's date: 04/08/2022 Total lbs lost to date: 23 lbs Total lbs lost since last in-office visit: +1 lb  Interim History: Patient is with only a 1 pound weight gain.  She fell at work in [facility] on Monday after Thanksgiving.  She has decreased exercise because of it.  Also a lot of [place] parties, but make good choices.  Subjective:   1. Pre-diabetes Metformin has been working well to decrease hunger and cravings.  2. Vitamin D deficiency She is currently taking prescription vitamin D 50,000 IU each week. She denies nausea, vomiting or muscle weakness.  Assessment/Plan:  No orders of the defined types were placed in this encounter.   Medications Discontinued During This Encounter  Medication Reason   Vitamin D, Ergocalciferol, (DRISDOL) 1.25 MG (50000 UNIT) CAPS capsule Reorder   metFORMIN (GLUCOPHAGE) 500 MG tablet Reorder     Meds ordered this encounter  Medications   metFORMIN (GLUCOPHAGE) 500 MG tablet    Sig: 2 po with lunch and 1 with dinner daily    Dispense:  90 tablet    Refill:  0    30 d supply;  ** OV for RF **   Do not send RF request   Vitamin D, Ergocalciferol, (DRISDOL) 1.25 MG (50000 UNIT) CAPS capsule    Sig: Take 1 capsule (50,000 Units total) by mouth every 7 (seven) days.    Dispense:  4 capsule    Refill:  0     1. Pre-diabetes Amber Parrish will continue to work on weight loss, exercise, and decreasing simple carbohydrates to help decrease the risk of diabetes. No change in dose needed.  Refill- metFORMIN (GLUCOPHAGE) 500 MG tablet; 2 po  with lunch and 1 with dinner daily  Dispense: 90 tablet; Refill: 0  2. Vitamin D deficiency Low Vitamin D level contributes to fatigue and are associated with obesity, breast, and colon cancer. She agrees to continue to take prescription Vitamin D @50 ,000 IU every week and will follow-up for routine testing of Vitamin D, at least 2-3 times per year to avoid over-replacement.  Refill- Vitamin D, Ergocalciferol, (DRISDOL) 1.25 MG (50000 UNIT) CAPS capsule; Take 1 capsule (50,000 Units total) by mouth every 7 (seven) days.  Dispense: 4 capsule; Refill: 0  3. Obesity, current BMI 38.3 Start with some exercise in the new year.  Holiday recipes and eating strategies handouts given to patient today.  Amber Parrish is currently in the action stage of change. As such, her goal is to continue with weight loss efforts. She has agreed to the Category 3 Plan.   Exercise goals: All adults should avoid inactivity. Some physical activity is better than none, and adults who participate in any amount of physical activity gain some health benefits.  Behavioral modification strategies: holiday eating strategies  and celebration eating strategies.  Amber Parrish has agreed to follow-up with our clinic in 4 weeks. She was informed of the importance of frequent follow-up visits to maximize her success with intensive lifestyle modifications for her multiple health conditions.   Objective:   Blood pressure (!) 140/78, pulse 72, temperature 98.2  F (36.8 C), height 5\' 4"  (1.626 m), weight 223 lb 6.4 oz (101.3 kg), SpO2 98 %. Body mass index is 38.35 kg/m.  General: Cooperative, alert, well developed, in no acute distress. HEENT: Conjunctivae and lids unremarkable. Cardiovascular: Regular rhythm.  Lungs: Normal work of breathing. Neurologic: No focal deficits.   Lab Results  Component Value Date   CREATININE 0.66 04/24/2021   BUN 14 04/24/2021   NA 139 04/24/2021   K 4.5 04/24/2021   CL 102 04/24/2021   CO2 24  04/24/2021   Lab Results  Component Value Date   ALT 23 04/24/2021   AST 21 04/24/2021   ALKPHOS 77 04/24/2021   BILITOT 0.4 04/24/2021   Lab Results  Component Value Date   HGBA1C 5.5 09/18/2021   HGBA1C 5.7 (H) 04/24/2021   HGBA1C 5.7 (H) 11/15/2019   HGBA1C 5.7 (H) 07/03/2019   Lab Results  Component Value Date   INSULIN 15.0 01/15/2022   INSULIN 11.4 09/18/2021   INSULIN 13.3 04/24/2021   INSULIN 17.5 11/15/2019   INSULIN 16.7 07/03/2019   Lab Results  Component Value Date   TSH 1.120 04/24/2021   Lab Results  Component Value Date   CHOL 186 01/15/2022   HDL 70 01/15/2022   LDLCALC 102 (H) 01/15/2022   TRIG 78 01/15/2022   CHOLHDL 3.4 11/15/2019   Lab Results  Component Value Date   VD25OH 46.4 01/15/2022   VD25OH 42.9 09/18/2021   VD25OH 31.0 04/24/2021   Lab Results  Component Value Date   WBC 4.4 04/24/2021   HGB 13.5 04/24/2021   HCT 41.5 04/24/2021   MCV 90 04/24/2021   PLT 270 04/24/2021   No results found for: "IRON", "TIBC", "FERRITIN"  Attestation Statements:   Reviewed by clinician on day of visit: allergies, medications, problem list, medical history, surgical history, family history, social history, and previous encounter notes.  I, Davy Pique, RMA, am acting as Location manager for [employer], DO.  I have reviewed the above documentation for accuracy and completeness, and I agree with the above. Marjory Sneddon, D.O.  The [place] was signed into law in 2016 which includes the topic of electronic health records.  This provides immediate access to information in MyChart.  This includes consultation notes, operative notes, office notes, lab results and pathology reports.  If you have any questions about what you read please let us know at your next visit so we can discuss your concerns and take corrective action if need be.  We are right here with you.

## 2022-05-13 ENCOUNTER — Other Ambulatory Visit: Payer: Self-pay | Admitting: Neurology

## 2022-05-14 ENCOUNTER — Ambulatory Visit (INDEPENDENT_AMBULATORY_CARE_PROVIDER_SITE_OTHER): Payer: 59 | Admitting: Family Medicine

## 2022-05-14 ENCOUNTER — Encounter (INDEPENDENT_AMBULATORY_CARE_PROVIDER_SITE_OTHER): Payer: Self-pay | Admitting: Family Medicine

## 2022-05-14 VITALS — BP 93/66 | HR 69 | Temp 98.2°F | Ht 64.0 in | Wt 221.4 lb

## 2022-05-14 DIAGNOSIS — E669 Obesity, unspecified: Secondary | ICD-10-CM | POA: Diagnosis not present

## 2022-05-14 DIAGNOSIS — R7303 Prediabetes: Secondary | ICD-10-CM | POA: Diagnosis not present

## 2022-05-14 DIAGNOSIS — Z6838 Body mass index (BMI) 38.0-38.9, adult: Secondary | ICD-10-CM

## 2022-05-14 DIAGNOSIS — E7849 Other hyperlipidemia: Secondary | ICD-10-CM

## 2022-05-14 DIAGNOSIS — E559 Vitamin D deficiency, unspecified: Secondary | ICD-10-CM

## 2022-05-14 DIAGNOSIS — E66813 Obesity, class 3: Secondary | ICD-10-CM

## 2022-05-14 MED ORDER — VITAMIN D (ERGOCALCIFEROL) 1.25 MG (50000 UNIT) PO CAPS: 50000.0000 [IU] | ORAL_CAPSULE | ORAL | 0 refills | Status: DC

## 2022-05-14 MED ORDER — METFORMIN HCL 500 MG PO TABS: ORAL_TABLET | ORAL | 0 refills | Status: DC

## 2022-05-15 LAB — BASIC METABOLIC PANEL
BUN/Creatinine Ratio: 25 — ABNORMAL HIGH (ref 9–23)
BUN: 18 mg/dL (ref 6–24)
CO2: 22 mmol/L (ref 20–29)
Calcium: 10.4 mg/dL — ABNORMAL HIGH (ref 8.7–10.2)
Chloride: 103 mmol/L (ref 96–106)
Creatinine, Ser: 0.73 mg/dL (ref 0.57–1.00)
Glucose: 90 mg/dL (ref 70–99)
Potassium: 4.9 mmol/L (ref 3.5–5.2)
Sodium: 140 mmol/L (ref 134–144)
eGFR: 101 mL/min/{1.73_m2} (ref >59)

## 2022-05-15 LAB — MAGNESIUM: Magnesium: 2.1 mg/dL (ref 1.6–2.3)

## 2022-05-15 LAB — HEMOGLOBIN A1C
Est. average glucose Bld gHb Est-mCnc: 111 mg/dL
Hgb A1c MFr Bld: 5.5 % (ref 4.8–5.6)

## 2022-05-15 LAB — VITAMIN D 25 HYDROXY (VIT D DEFICIENCY, FRACTURES): Vit D, 25-Hydroxy: 48.5 ng/mL (ref 30.0–100.0)

## 2022-06-01 NOTE — Progress Notes (Unsigned)
Chief Complaint:   OBESITY Amber Parrish is here to discuss her progress with her obesity treatment plan along with follow-up of her obesity related diagnoses. Lillyauna is on the Category 3 Plan and states she is following her eating plan approximately 80% of the time. [name] states she is not exercising.  Today's visit was #: 74 Starting weight: 246 lbs Starting date: 04/24/2021 Today's weight: 221 lbs Today's date: 05/14/2022 Total lbs lost to date: 25 lbs Total lbs lost since last in-office visit: 2 lbs  Interim History: Amber Parrish is here for a follow up office visit.  We reviewed her meal plan and all questions were answered.  Patient's food recall appears to be accurate and consistent with what is on plan when she is following it.   When eating on plan, her hunger and cravings are well controlled.     Subjective:   1. Other hyperlipidemia Last labs checked 3 months ago.  Patient has improving LDL at 102 and HDL at 70.  2. Vitamin D deficiency Amber Parrish is tolerating medication(s) well without side effects.  Medication compliance is good as patient endorses taking it as prescribed.  Symptoms are stable and the patient denies additional concerns regarding this condition.    3. Pre-diabetes Patient is on metformin.  Last A1c was 7 to 8 months ago and it was 5.5, previously it was 5.7.  Patient is positive for cramps.  Patient is tolerating medication well.  Assessment/Plan:   Orders Placed This Encounter  Procedures   VITAMIN D 25 Hydroxy (Vit-D Deficiency, Fractures)   Magnesium   Basic metabolic panel   Hemoglobin A1c    Medications Discontinued During This Encounter  Medication Reason   metFORMIN (GLUCOPHAGE) 500 MG tablet Reorder   Vitamin D, Ergocalciferol, (DRISDOL) 1.25 MG (50000 UNIT) CAPS capsule Reorder     Meds ordered this encounter  Medications   metFORMIN (GLUCOPHAGE) 500 MG tablet    Sig: 2 po with lunch and 1 with dinner daily     Dispense:  90 tablet    Refill:  0    30 d supply;  ** OV for RF **   Do not send RF request   Vitamin D, Ergocalciferol, (DRISDOL) 1.25 MG (50000 UNIT) CAPS capsule    Sig: Take 1 capsule (50,000 Units total) by mouth every 7 (seven) days.    Dispense:  4 capsule    Refill:  0     1. Other hyperlipidemia Continue PNP and low saturated and trans fat diet.  2. Vitamin D deficiency Check labs today.  Refill- Vitamin D, Ergocalciferol, (DRISDOL) 1.25 MG (50000 UNIT) CAPS capsule; Take 1 capsule (50,000 Units total) by mouth every 7 (seven) days.  Dispense: 4 capsule; Refill: 0  - VITAMIN D 25 Hydroxy (Vit-D Deficiency, Fractures)  3. Pre-diabetes Check labs today.  Refill- metFORMIN (GLUCOPHAGE) 500 MG tablet; 2 po with lunch and 1 with dinner daily  Dispense: 90 tablet; Refill: 0  - Magnesium - Basic metabolic panel - Hemoglobin A1c  4. Obesity, current BMI 38.0 Focus on mindfulness and water intake and move more.  [name] is currently in the action stage of change. As such, her goal is to continue with weight loss efforts. She has agreed to the Category 3 Plan.   Exercise goals: All adults should avoid inactivity. Some physical activity is better than none, and adults who participate in any amount of physical activity gain some health benefits.  Behavioral modification strategies: increasing  lean protein intake, increasing water intake, no skipping meals, and avoiding temptations.  Bristyn has agreed to follow-up with our clinic in 3-5 weeks. She was informed of the importance of frequent follow-up visits to maximize her success with intensive lifestyle modifications for her multiple health conditions.   Ankita was informed we would discuss her lab results at her next visit unless there is a critical issue that needs to be addressed sooner. Desaree agreed to keep her next visit at the agreed upon time to discuss these results.  Objective:   Blood pressure 93/66, pulse 69,  temperature 98.2 F (36.8 C), height 5' 4"$  (1.626 m), weight 221 lb 6.4 oz (100.4 kg), SpO2 99 %. Body mass index is 38 kg/m.  General: Cooperative, alert, well developed, in no acute distress. HEENT: Conjunctivae and lids unremarkable. Cardiovascular: Regular rhythm.  Lungs: Normal work of breathing. Neurologic: No focal deficits.   Lab Results  Component Value Date   CREATININE 0.73 05/14/2022   BUN 18 05/14/2022   NA 140 05/14/2022   K 4.9 05/14/2022   CL 103 05/14/2022   CO2 22 05/14/2022   Lab Results  Component Value Date   ALT 23 04/24/2021   AST 21 04/24/2021   ALKPHOS 77 04/24/2021   BILITOT 0.4 04/24/2021   Lab Results  Component Value Date   HGBA1C 5.5 05/14/2022   HGBA1C 5.5 09/18/2021   HGBA1C 5.7 (H) 04/24/2021   HGBA1C 5.7 (H) 11/15/2019   HGBA1C 5.7 (H) 07/03/2019   Lab Results  Component Value Date   INSULIN 15.0 01/15/2022   INSULIN 11.4 09/18/2021   INSULIN 13.3 04/24/2021   INSULIN 17.5 11/15/2019   INSULIN 16.7 07/03/2019   Lab Results  Component Value Date   TSH 1.120 04/24/2021   Lab Results  Component Value Date   CHOL 186 01/15/2022   HDL 70 01/15/2022   LDLCALC 102 (H) 01/15/2022   TRIG 78 01/15/2022   CHOLHDL 3.4 11/15/2019   Lab Results  Component Value Date   VD25OH 48.5 05/14/2022   VD25OH 46.4 01/15/2022   VD25OH 42.9 09/18/2021   Lab Results  Component Value Date   WBC 4.4 04/24/2021   HGB 13.5 04/24/2021   HCT 41.5 04/24/2021   MCV 90 04/24/2021   PLT 270 04/24/2021   No results found for: "IRON", "TIBC", "FERRITIN"  Attestation Statements:   Reviewed by clinician on day of visit: allergies, medications, problem list, medical history, surgical history, family history, social history, and previous encounter notes.  I, Davy Pique, RMA, am acting as Location manager for [employer], DO.   I have reviewed the above documentation for accuracy and completeness, and I agree with the above. Marjory Sneddon,  D.O.  The [place] was signed into law in 2016 which includes the topic of electronic health records.  This provides immediate access to information in MyChart.  This includes consultation notes, operative notes, office notes, lab results and pathology reports.  If you have any questions about what you read please let us know at your next visit so we can discuss your concerns and take corrective action if need be.  We are right here with you.

## 2022-06-13 ENCOUNTER — Other Ambulatory Visit (INDEPENDENT_AMBULATORY_CARE_PROVIDER_SITE_OTHER): Payer: Self-pay | Admitting: Family Medicine

## 2022-06-13 DIAGNOSIS — R7303 Prediabetes: Secondary | ICD-10-CM

## 2022-06-15 ENCOUNTER — Telehealth (INDEPENDENT_AMBULATORY_CARE_PROVIDER_SITE_OTHER): Payer: Self-pay | Admitting: Family Medicine

## 2022-06-15 ENCOUNTER — Other Ambulatory Visit (INDEPENDENT_AMBULATORY_CARE_PROVIDER_SITE_OTHER): Payer: Self-pay | Admitting: Bariatrics

## 2022-06-15 ENCOUNTER — Other Ambulatory Visit (INDEPENDENT_AMBULATORY_CARE_PROVIDER_SITE_OTHER): Payer: Self-pay | Admitting: Family Medicine

## 2022-06-15 DIAGNOSIS — R7303 Prediabetes: Secondary | ICD-10-CM

## 2022-06-15 MED ORDER — METFORMIN HCL 500 MG PO TABS: ORAL_TABLET | ORAL | 0 refills | Status: DC

## 2022-06-15 NOTE — Telephone Encounter (Signed)
I sent the prescription.

## 2022-06-15 NOTE — Telephone Encounter (Signed)
[facility] [place] [address] called 06/15/22 needing a new prescription for Metformin sent in for 90 day supply. there was one sent in for 30 day supply.The insurance will only pay for a 90 day supply.other wise the patient would have to pay full price for the 30 day supply.

## 2022-06-24 ENCOUNTER — Other Ambulatory Visit: Payer: Self-pay | Admitting: Neurology

## 2022-06-25 ENCOUNTER — Ambulatory Visit (INDEPENDENT_AMBULATORY_CARE_PROVIDER_SITE_OTHER): Payer: 59 | Admitting: Family Medicine

## 2022-07-02 ENCOUNTER — Encounter (INDEPENDENT_AMBULATORY_CARE_PROVIDER_SITE_OTHER): Payer: Self-pay | Admitting: Family Medicine

## 2022-07-02 ENCOUNTER — Ambulatory Visit (INDEPENDENT_AMBULATORY_CARE_PROVIDER_SITE_OTHER): Payer: 59 | Admitting: Family Medicine

## 2022-07-02 VITALS — BP 145/81 | HR 81 | Temp 98.4°F | Ht 64.0 in | Wt 224.4 lb

## 2022-07-02 DIAGNOSIS — R7303 Prediabetes: Secondary | ICD-10-CM | POA: Diagnosis not present

## 2022-07-02 DIAGNOSIS — E7849 Other hyperlipidemia: Secondary | ICD-10-CM

## 2022-07-02 DIAGNOSIS — Z6838 Body mass index (BMI) 38.0-38.9, adult: Secondary | ICD-10-CM

## 2022-07-02 DIAGNOSIS — E559 Vitamin D deficiency, unspecified: Secondary | ICD-10-CM | POA: Diagnosis not present

## 2022-07-02 MED ORDER — VITAMIN D (ERGOCALCIFEROL) 1.25 MG (50000 UNIT) PO CAPS: 50000.0000 [IU] | ORAL_CAPSULE | ORAL | 0 refills | Status: DC

## 2022-07-02 MED ORDER — METFORMIN HCL 500 MG PO TABS: ORAL_TABLET | ORAL | 0 refills | Status: DC

## 2022-07-02 NOTE — Progress Notes (Signed)
Amber Parrish, D.O.  ABFM, ABOM Specializing in Clinical Bariatric Medicine  Office located at: [address], [place]  [id number]     Assessment and Plan:   No orders of the defined types were placed in this encounter.   Medications Discontinued During This Encounter  Medication Reason   Vitamin D, Ergocalciferol, (DRISDOL) 1.25 MG (50000 UNIT) CAPS capsule Reorder   metFORMIN (GLUCOPHAGE) 500 MG tablet Reorder     Meds ordered this encounter  Medications   Vitamin D, Ergocalciferol, (DRISDOL) 1.25 MG (50000 UNIT) CAPS capsule    Sig: Take 1 capsule (50,000 Units total) by mouth every 7 (seven) days.    Dispense:  4 capsule    Refill:  0   metFORMIN (GLUCOPHAGE) 500 MG tablet    Sig: 2 po with lunch and 1 with dinner daily    Dispense:  90 tablet    Refill:  0    30 d supply;  ** OV for RF **   Do not send RF request     Morbid obesity (HCC)-start bmi 42.23/start 04/24/21 Assessment: Condition is Improving, but not optimized.. Biometric data collected today, was reviewed with patient.  Fat mass has increased by 1.8lb. Muscle mass has increased by .8lb. Total body water has increased by 1.8lb.   Plan: Continue the category 3 meal plan . She would like to work to improve her adherence to the meal plan as she is able with her current life stressors. Continue walking regimen.    Pre-diabetes Assessment: Condition is At goal.. Labs were reviewed.   Lab Results  Component Value Date   HGBA1C 5.5 05/14/2022   HGBA1C 5.5 09/18/2021   HGBA1C 5.7 (H) 04/24/2021   INSULIN 15.0 01/15/2022   INSULIN 11.4 09/18/2021   INSULIN 13.3 04/24/2021  She has been compliance with Metformin 1000mg  at lunch and 500mg  at dinner- she is tolerating this well.     Latest Ref Rng & Units 05/14/2022   10:00 AM 04/24/2021    9:06 AM 07/03/2019   11:56 AM  BMP  Glucose 70 - 99 mg/dL 90  100  99   BUN 6 - 24 mg/dL 18  14  13    Creatinine 0.57 - 1.00 mg/dL 0.73  0.66  0.67    BUN/Creat Ratio 9 - 23 25  21  19    Sodium 134 - 144 mmol/L 140  139  140   Potassium 3.5 - 5.2 mmol/L 4.9  4.5  4.5   Chloride 96 - 106 mmol/L 103  102  105   CO2 20 - 29 mmol/L 22  24  20    Calcium 8.7 - 10.2 mg/dL 10.4  9.8  9.5   She states that she is likely not drinking enough water.  Plan:Continue Metformin at current dose- Will refill this medication today.   - Amber Parrish will continue to work on weight loss, exercise, via their meal plan we devised to help decrease the risk of progressing to diabetes.  - We will recheck A1c and fasting insulin level in approximately 3 months from last check, or as deemed appropriate.   In regards to her elevated BUN/Creatinine ratio, I encouraged her to increase her water intake.   Vitamin D deficiency Assessment: Condition is Improving, but not optimized.. Labs were reviewed.  Lab Results  Component Value Date   VD25OH 48.5 05/14/2022   VD25OH 46.4 01/15/2022   VD25OH 42.9 09/18/2021  She reports compliance and tolerance of Ergocalciferol 50K IU  once weekly.  Plan: Continue Ergocalciferol 50K IU weekly- Will refill this today.  - weight loss will likely improve availability of vitamin D, thus encouraged Amber Parrish to continue with meal plan and their weight loss efforts to further improve this condition.  Thus, we will need to monitor levels regularly (every 3-4 mo on average) to keep levels within normal limits and prevent over supplementation.   Other hyperlipidemia Assessment: Condition is At goal.. Labs were reviewed. Lab Results  Component Value Date   CHOL 186 01/15/2022   HDL 70 01/15/2022   LDLCALC 102 (H) 01/15/2022   TRIG 78 01/15/2022   CHOLHDL 3.4 11/15/2019  This is diet controlled at this time- she is not on any cholesterol lowering medications.  Plan:  Amber Parrish agrees to continue with our treatment plan of a heart-heathy, low cholesterol meal plan - I stressed the importance that patient continue with our prudent  nutritional plan that is low in saturated and trans fats, and low in fatty carbs to improve these numbers.   - We will continue routine screening as patient continues to achieve health goals along their weight loss journey Last lipid panel as above.     TREATMENT PLAN FOR OBESITY:  Recommended Dietary Goals Amber Parrish is currently in the action stage of change. As such, her goal is to continue weight management plan. She has agreed to continue the Category 3 Plan.  Behavioral Intervention Additional resources provided today:  patient declined Evidence-based interventions for health behavior change were utilized today including the discussion of self monitoring techniques, problem-solving barriers and SMART goal setting techniques.   Regarding patient's less desirable eating habits and patterns, we employed the technique of small changes.  Pt will specifically work on: Continue walking regimen for weight loss and stress management- improve adherence to meal plan as she is able to with her busy schedule for next visit.    Recommended Physical Activity Goals Amber Parrish has been advised to work up to 150 minutes of moderate intensity aerobic activity a week and strengthening exercises 2-3 times per week for cardiovascular health, weight loss maintenance and preservation of muscle mass.  She has agreed to Continue current level of physical activity    FOLLOW UP: Return in about 4 weeks (around 07/30/2022). She was informed of the importance of frequent follow up visits to maximize her success with intensive lifestyle modifications for her multiple health conditions.  Subjective:   Chief complaint: Obesity Amber Parrish is here to discuss her progress with her obesity treatment plan. She is on the the Category 3 Plan and states she is following her eating plan approximately 60% of the time. She states she is walking 30-40 minutes 3-4 days per week.  Interval History:  Amber Parrish is here for a  follow up office visit. We reviewed her meal plan and all questions were answered. Patient's food recall appears to be accurate and consistent with what is on plan when she is following it. When eating on plan, her hunger and cravings are well controlled.     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 She is not sleeping well. She states that she is not snacking but she is not eating as many healthy options as she did prior to the increased stress.   Review of Systems:  Pertinent positives were addressed with patient today.  Weight Summary and Biometrics   Weight Lost Since Last Visit: +  3lb   Vitals Temp: 98.4 F (36.9 C) BP: (!) 145/81 Pulse Rate: 81 SpO2: 99 %   Anthropometric Measurements Height: 5\' 4"  (1.626 m) Weight: 224 lb 6.4 oz (101.8 kg) BMI (Calculated): 38.5 Weight at Last Visit: 221lb Weight Lost Since Last Visit: +3lb Starting Weight: 246lb Total Weight Loss (lbs): 22 lb (9.979 kg) Peak Weight: 258lb   Body Composition  Body Fat %: 43.8 % Fat Mass (lbs): 98.2 lbs Muscle Mass (lbs): 119.8 lbs Total Body Water (lbs): 86.2 lbs Visceral Fat Rating : 12   Other Clinical Data Fasting: yes Labs: no Today's Visit #: 17 Starting Date: 04/24/21    Objective:   PHYSICAL EXAM:  Blood pressure (!) 145/81, pulse 81, temperature 98.4 F (36.9 C), height 5\' 4"  (1.626 m), weight 224 lb 6.4 oz (101.8 kg), SpO2 99 %. Body mass index is 38.52 kg/m.  General: Well Developed, well nourished, and in no acute distress.  HEENT: Normocephalic, atraumatic Skin: Warm and dry, cap RF less 2 sec, good turgor Chest:  Normal excursion, shape, no gross abn Respiratory: speaking in full sentences, no conversational dyspnea NeuroM-Sk: Ambulates w/o assistance, moves * 4 Psych: A and O *3, insight good, mood-full  DIAGNOSTIC DATA REVIEWED:  BMET    Component Value Date/Time    NA 140 05/14/2022 1000   K 4.9 05/14/2022 1000   CL 103 05/14/2022 1000   CO2 22 05/14/2022 1000   GLUCOSE 90 05/14/2022 1000   GLUCOSE 82 01/26/2011 1450   BUN 18 05/14/2022 1000   CREATININE 0.73 05/14/2022 1000   CREATININE 0.61 01/26/2011 1450   CALCIUM 10.4 (H) 05/14/2022 1000   GFRNONAA 106 07/03/2019 1156   GFRAA 122 07/03/2019 1156   Lab Results  Component Value Date   HGBA1C 5.5 05/14/2022   HGBA1C 5.7 (H) 07/03/2019   Lab Results  Component Value Date   INSULIN 15.0 01/15/2022   INSULIN 16.7 07/03/2019   Lab Results  Component Value Date   TSH 1.120 04/24/2021   CBC    Component Value Date/Time   WBC 4.4 04/24/2021 0906   WBC 5.8 01/26/2011 1450   RBC 4.63 04/24/2021 0906   RBC 4.17 01/26/2011 1450   HGB 13.5 04/24/2021 0906   HCT 41.5 04/24/2021 0906   PLT 270 04/24/2021 0906   MCV 90 04/24/2021 0906   MCH 29.2 04/24/2021 0906   MCH 30.5 01/26/2011 1450   MCHC 32.5 04/24/2021 0906   MCHC 33.1 01/26/2011 1450   RDW 13.1 04/24/2021 0906   Iron Studies No results found for: "IRON", "TIBC", "FERRITIN", "IRONPCTSAT" Lipid Panel     Component Value Date/Time   CHOL 186 01/15/2022 0855   TRIG 78 01/15/2022 0855   HDL 70 01/15/2022 0855   CHOLHDL 3.4 11/15/2019 1124   LDLCALC 102 (H) 01/15/2022 0855   Hepatic Function Panel     Component Value Date/Time   PROT 7.1 04/24/2021 0906   ALBUMIN 4.7 04/24/2021 0906   AST 21 04/24/2021 0906   ALT 23 04/24/2021 0906   ALKPHOS 77 04/24/2021 0906   BILITOT 0.4 04/24/2021 0906      Component Value Date/Time   TSH 1.120 04/24/2021 0906   Nutritional Lab Results  Component Value Date   VD25OH 48.5 05/14/2022   VD25OH 46.4 01/15/2022   VD25OH 42.9 09/18/2021    Attestations:   Reviewed by clinician on day of visit: allergies, medications, problem list, medical history, surgical history, family history, social history, and previous encounter notes.  I,Special Puri,acting  as a Education administrator for [employer], DO.,have documented all relevant documentation on the behalf of Mellody Dance, DO,as directed by  Mellody Dance, DO while in the presence of Mellody Dance, DO.   I, Mellody Dance, DO, have reviewed all documentation for this visit. The documentation on 07/08/22 for the exam, diagnosis, procedures, and orders are all accurate and complete.

## 2022-07-09 ENCOUNTER — Other Ambulatory Visit (INDEPENDENT_AMBULATORY_CARE_PROVIDER_SITE_OTHER): Payer: Self-pay | Admitting: Family Medicine

## 2022-07-09 DIAGNOSIS — R7303 Prediabetes: Secondary | ICD-10-CM

## 2022-07-21 ENCOUNTER — Other Ambulatory Visit: Payer: Self-pay | Admitting: Neurology

## 2022-07-22 NOTE — Progress Notes (Signed)
NEUROLOGY FOLLOW UP OFFICE NOTE  Amber Parrish [id number]  Assessment/Plan:   Chronic migraines.  Despite negative eye exam for papilledema, query idiopathic intracranial hypertension   Will check CT head followed by LP to assess opening pressure.  If elevated, would start acetazolamide 500mg  twice daily Headache prevention:  venlafaxine XR 75mg  daily Headache rescue:  Rizatriptan 10mg   Limit use of pain relievers to no more than 2 days out of week to prevent risk of rebound or medication-overuse headache. Tizanidine 2mg  at bedtime   Keep headache diary Follow up 6 months.     Subjective:  Amber Parrish is a 52 year old right-handed female who follows up for headaches.   UPDATE: Last visit on 11/30, tapered off sertraline and then started venlafaxine. Duration:  1 hour with rizatriptan Frequency:  Still daily but less severity.  More severe requiring rizatriptan 2-3 days a week.   Still with blurred vision.  Recent eye exam last month revealed no papilledema.  She did hear a whooshing sound in head when headaches are more severe, so that has decreased.     Current NSAIDS/analgesics:  ibuprofen (rare) Current triptans:  rizatriptan 10mg  Current ergotamine:  none Current anti-emetic:  none Current muscle relaxants:  tizanidine 2mg   at bedtime (cannot tolerate taking three times daily) Current Antihypertensive medications:  none Current Antidepressant medications:  venlafaxine ER 37.5mg  daily Current Anticonvulsant medications: none Current anti-CGRP:  none Current Vitamins/Herbal/Supplements:  none Current Antihistamines/Decongestants:  none Other therapy:  none Hormone/birth control:  none   Caffeine:  occasional coffee Diet:  Hydrates.  Watches her diet.  No soda.  [facility]. Exercise:  not routine Depression:  no; Anxiety: stable  Other pain:  no Sleep hygiene:  wakes up more often in the middle of the night. Maybe 6  hours of sleep.  No daytime sleepiness.     HISTORY:  She began having headaches around the summer 2022.   It is a unilateral pressure occipital headache (either side) that begins as 2-3/10 in the morning and gradually spreads to the forehead at a 6-7/10 intensity late in the day.  Sometimes neck pain and radiates into both temples by end of day.  She has photosensitivity but denies nausea, vomiting, phonophobia, autonomic symptoms, dizziness, numbness or weakness.  They occur daily.  Bright light and intense smells are triggers.  Rest and hydration help relieve it.  MRI and MRV of brain with and without contrast in November 2022 personally reviewed were unremarkable.  Later that month, she started noticing blurred vision whenever concentrating/reading or using computer but denies visual obscurations.  She has not recently had an eye exam.  When headaches are more severe, she may hear a whooshing in her head.  She tried treating with ibuprofen but since ineffective, she has not taken any analgesics.  Also with some neck pain.  X-ray of cervical spine was unremarkable     Her father passed away in early 2020/08/11 but no new stressors by the time headaches started.  She works as an Airline pilot.  Work and home life is the same.  No injuries or illness.       Past NSAIDS/analgesics:  ibuprofen Past abortive triptans:  none Past abortive ergotamine:  none Past muscle relaxants:  none Past anti-emetic:  none Past antihypertensive medications:  furosemide Past antidepressant medications:  Sertraline, Wellbutrin Past anticonvulsant medications:  topiramate ER Past anti-CGRP:  none Past vitamins/Herbal/Supplements:  none Past antihistamines/decongestants:  Benadryl Other past therapies:  [facility] (Dr. Richardean Sale - effective)     Family history of headache:  not that she knows.   PAST MEDICAL HISTORY: Past Medical History:  Diagnosis Date   Bilateral hand swelling    Headache    Heartburn    Joint  pain    Muscle pain    Other fatigue    Overweight    Shortness of breath on exertion     MEDICATIONS: Current Outpatient Medications on File Prior to Visit  Medication Sig Dispense Refill   metFORMIN (GLUCOPHAGE) 500 MG tablet 2 po with lunch and 1 with dinner daily 90 tablet 0   rizatriptan (MAXALT) 10 MG tablet TAKE 1 TABLET BY MOUTH AS NEEDED. REPEAT IN 2 HOURS IF NEEDED. MAX 2TABS/24HRS 18 tablet 1   tiZANidine (ZANAFLEX) 2 MG tablet TAKE 1 TABLET BY MOUTH THREE TIMES A DAY AS NEEDED 270 tablet 0   venlafaxine XR (EFFEXOR XR) 37.5 MG 24 hr capsule Take 1 capsule (37.5 mg total) by mouth daily with breakfast. 30 capsule 5   Vitamin D, Ergocalciferol, (DRISDOL) 1.25 MG (50000 UNIT) CAPS capsule Take 1 capsule (50,000 Units total) by mouth every 7 (seven) days. 4 capsule 0   No current facility-administered medications on file prior to visit.    ALLERGIES: Allergies  Allergen Reactions   Penicillins     FAMILY HISTORY: Family History  Problem Relation Age of Onset   Diabetes Mother    Hypertension Mother    Obesity Mother    Diabetes Father    Alcohol abuse Father    Arthritis Maternal Grandmother    Cancer Maternal Grandmother    Diabetes Maternal Grandmother    Hypertension Maternal Grandmother       Objective:  Blood pressure 129/89, pulse 77, height  (1.626 m), weight 232 lb 3.2 oz (105.3 kg), SpO2 98 %. General: No acute distress.  Patient appears well-groomed.   Head:  Normocephalic/atraumatic Eyes:  Fundi examined but not visualized Neck: supple, no paraspinal tenderness, full range of motion Heart:  Regular rate and rhythm Neurological Exam: alert and oriented to person, place, and time.  Speech fluent and not dysarthric, language intact.  CN II-XII intact. Bulk and tone normal, muscle strength 5/5 throughout.  Sensation to light touch intact.  Deep tendon reflexes 2+ throughout.  Finger to nose testing intact.  Gait normal, Romberg negative.   Shon Millet, DO  CC: Sharlot Gowda, MD

## 2022-07-23 ENCOUNTER — Ambulatory Visit: Payer: 59 | Admitting: Neurology

## 2022-07-23 VITALS — BP 129/89 | HR 77 | Ht 64.0 in | Wt 232.2 lb

## 2022-07-23 DIAGNOSIS — G932 Benign intracranial hypertension: Secondary | ICD-10-CM

## 2022-07-23 DIAGNOSIS — G44221 Chronic tension-type headache, intractable: Secondary | ICD-10-CM

## 2022-07-23 DIAGNOSIS — R519 Headache, unspecified: Secondary | ICD-10-CM | POA: Diagnosis not present

## 2022-07-23 MED ORDER — RIZATRIPTAN BENZOATE 10 MG PO TABS: ORAL_TABLET | ORAL | 5 refills | Status: DC

## 2022-07-23 MED ORDER — TIZANIDINE HCL 2 MG PO TABS: 2.0000 mg | ORAL_TABLET | Freq: Three times a day (TID) | ORAL | 1 refills | Status: DC | PRN

## 2022-07-23 MED ORDER — VENLAFAXINE HCL ER 75 MG PO CP24
75.0000 mg | ORAL_CAPSULE | Freq: Every day | ORAL | 5 refills | Status: DC
Start: 2022-07-23 — End: 2022-08-18

## 2022-07-23 NOTE — Patient Instructions (Signed)
Venlafaxine XR 75mg  daily.  Refilled rizatriptan and tizanidine Order CT head followed by spinal tap to measure opening pressure (check cell count, protein, glucose, cytology and gram stain/culture) Further recommendations pending results Follow up 6 months.

## 2022-07-24 ENCOUNTER — Encounter: Payer: Self-pay | Admitting: Neurology

## 2022-07-27 ENCOUNTER — Telehealth: Payer: Self-pay

## 2022-07-27 NOTE — Telephone Encounter (Signed)
Patient states that she was supposed to be contacted by two different radiology departments one for imaging and the other for the spinal tap. Wondering if she needs to contact someone or if she needs to continue waiting.

## 2022-07-28 NOTE — Telephone Encounter (Signed)
Advised patient the CT has be done before the LP since she hasn't had a imaging of the Brain in a year.   Once that is done they will contact patient to schedule the LP.  Advised patient if she has any further please give [place] or [facility] imaging a call. Per patient she has their number.

## 2022-07-30 ENCOUNTER — Ambulatory Visit (INDEPENDENT_AMBULATORY_CARE_PROVIDER_SITE_OTHER): Payer: 59 | Admitting: Family Medicine

## 2022-07-30 ENCOUNTER — Encounter (INDEPENDENT_AMBULATORY_CARE_PROVIDER_SITE_OTHER): Payer: Self-pay | Admitting: Family Medicine

## 2022-07-30 DIAGNOSIS — Z6838 Body mass index (BMI) 38.0-38.9, adult: Secondary | ICD-10-CM | POA: Diagnosis not present

## 2022-07-30 DIAGNOSIS — R7303 Prediabetes: Secondary | ICD-10-CM

## 2022-07-30 DIAGNOSIS — E559 Vitamin D deficiency, unspecified: Secondary | ICD-10-CM | POA: Diagnosis not present

## 2022-07-30 DIAGNOSIS — E669 Obesity, unspecified: Secondary | ICD-10-CM | POA: Diagnosis not present

## 2022-07-30 NOTE — Progress Notes (Signed)
Amber Parrish, D.O.  ABFM, ABOM Specializing in Clinical Bariatric Medicine  Office located at: [address]  [place], [place]  [id number]     Assessment and Plan:   No orders of the defined types were placed in this encounter.   There are no discontinued medications.   No orders of the defined types were placed in this encounter.   Pre-diabetes Assessment: Condition is stable. Lab Results  Component Value Date   HGBA1C 5.5 05/14/2022   HGBA1C 5.5 09/18/2021   HGBA1C 5.7 (H) 04/24/2021   INSULIN 15.0 01/15/2022   INSULIN 11.4 09/18/2021   INSULIN 13.3 04/24/2021  No issues with Metformin 500 mg TID. Denies any side effects.  Plan:Continue with med.  - Continue to decrease simple carbs/ sugars; increase fiber and proteins -> follow her meal plan.   - Amber Parrish will continue to work on weight loss, exercise, via their meal plan we devised to help decrease the risk of progressing to diabetes.    Vitamin D deficiency Assessment: Condition is stable. Lab Results  Component Value Date   VD25OH 48.5 05/14/2022   VD25OH 46.4 01/15/2022   VD25OH 42.9 09/18/2021  She reports good compliance with Ergocalciferol 50K IU weekly. Denies any side effects.  Plan: Continue with med. - weight loss will likely improve availability of vitamin D, thus encouraged Amber Parrish to continue with meal plan and their weight loss efforts to further improve this condition.  Thus, we will need to monitor levels regularly (every 3-4 mo on average) to keep levels within normal limits and prevent over supplementation.  TREATMENT PLAN FOR OBESITY: BMI 38.0-38.9,adult-current 38.5 Morbid obesity (HCC)-start bmi 42.23/start 04/24/21 Assessment: Condition is stable. Biometric data collected today, was reviewed with patient.  Fat mass has increased by .6lb. Muscle mass has decreased by 1lb. Total body water has increased by .6lb.   Plan:  Amber Parrish is currently in the action stage of change. As such,  her goal is to continue weight management plan. Patient was switched -just until next OV- from Category 3 meal plan to Portion Control/Smart Choices   Behavioral Intervention Additional resources provided today:  Portion control and smart choices handout Evidence-based interventions for health behavior change were utilized today including the discussion of self monitoring techniques, problem-solving barriers and SMART goal setting techniques.   Regarding patient's less desirable eating habits and patterns, we employed the technique of small changes.  Pt will specifically work on: 10-15 minutes of Migraine Meditation daily for next visit.    Recommended Physical Activity Goals She has agreed to Think about ways to increase physical activity  FOLLOW UP: Return in about 3 weeks (around 08/20/2022). She was informed of the importance of frequent follow up visits to maximize her success with intensive lifestyle modifications for her multiple health conditions.   Subjective:   Chief complaint: Obesity Amber Parrish is here to discuss her progress with her obesity treatment plan. She is on the the Category 3 Plan and states she is following her eating plan approximately 80% of the time. She states she is not exercising.  Interval History:  Amber Parrish is here for a follow up office visit. Since her last office visit, she meet with her neurologist for her chronic daily headaches and endorses feeling stressed. She states she is not getting restful sleep and  is taking naps throughout the day. She also endorses wanting to eat more because of her stress.For stress management and mindful breathing, I encouraged patient to try meditation techniques.  She endorses she has not been exercising, apart from walking in her office.   Pharmacotherapy for weight loss: She is currently taking  Metformin  for medical weight loss.  Denies side effects.    Review of Systems:  Pertinent positives were addressed with  patient today.   Weight Summary and Biometrics   No data recorded  No data recorded    No data recorded  No data recorded  No data recorded  No data recorded   Objective:   PHYSICAL EXAM: Blood pressure 136/64, pulse 76, temperature 98.4 F (36.9 C), height 5\' 4"  (1.626 m), weight 224 lb 6.4 oz (101.8 kg), SpO2 98 %. Body mass index is 38.52 kg/m.  General: Well Developed, well nourished, and in no acute distress.  HEENT: Normocephalic, atraumatic Skin: Warm and dry, cap RF less 2 sec, good turgor Chest:  Normal excursion, shape, no gross abn Respiratory: speaking in full sentences, no conversational dyspnea NeuroM-Sk: Ambulates w/o assistance, moves * 4 Psych: A and O *3, insight good, mood-full  DIAGNOSTIC DATA REVIEWED:  BMET    Component Value Date/Time   NA 140 05/14/2022 1000   K 4.9 05/14/2022 1000   CL 103 05/14/2022 1000   CO2 22 05/14/2022 1000   GLUCOSE 90 05/14/2022 1000   GLUCOSE 82 01/26/2011 1450   BUN 18 05/14/2022 1000   CREATININE 0.73 05/14/2022 1000   CREATININE 0.61 01/26/2011 1450   CALCIUM 10.4 (H) 05/14/2022 1000   GFRNONAA 106 07/03/2019 1156   GFRAA 122 07/03/2019 1156   Lab Results  Component Value Date   HGBA1C 5.5 05/14/2022   HGBA1C 5.7 (H) 07/03/2019   Lab Results  Component Value Date   INSULIN 15.0 01/15/2022   INSULIN 16.7 07/03/2019   Lab Results  Component Value Date   TSH 1.120 04/24/2021   CBC    Component Value Date/Time   WBC 4.4 04/24/2021 0906   WBC 5.8 01/26/2011 1450   RBC 4.63 04/24/2021 0906   RBC 4.17 01/26/2011 1450   HGB 13.5 04/24/2021 0906   HCT 41.5 04/24/2021 0906   PLT 270 04/24/2021 0906   MCV 90 04/24/2021 0906   MCH 29.2 04/24/2021 0906   MCH 30.5 01/26/2011 1450   MCHC 32.5 04/24/2021 0906   MCHC 33.1 01/26/2011 1450   RDW 13.1 04/24/2021 0906   Iron Studies No results found for: "IRON", "TIBC", "FERRITIN", "IRONPCTSAT" Lipid Panel     Component Value Date/Time   CHOL 186  01/15/2022 0855   TRIG 78 01/15/2022 0855   HDL 70 01/15/2022 0855   CHOLHDL 3.4 11/15/2019 1124   LDLCALC 102 (H) 01/15/2022 0855   Hepatic Function Panel     Component Value Date/Time   PROT 7.1 04/24/2021 0906   ALBUMIN 4.7 04/24/2021 0906   AST 21 04/24/2021 0906   ALT 23 04/24/2021 0906   ALKPHOS 77 04/24/2021 0906   BILITOT 0.4 04/24/2021 0906      Component Value Date/Time   TSH 1.120 04/24/2021 0906   Nutritional Lab Results  Component Value Date   VD25OH 48.5 05/14/2022   VD25OH 46.4 01/15/2022   VD25OH 42.9 09/18/2021    Attestations:   Reviewed by clinician on day of visit: allergies, medications, problem list, medical history, surgical history, family history, social history, and previous encounter notes.  Patient was in the office today and time spent on visit including pre-visit chart review and post-visit care/coordination of care and electronic medical record documentation was 30 minutes. 50% of that time  was in face to face counseling of this patient's medical condition(s) and providing education on treatment options to always include the first-line treatment of diet and lifestyle modification.    I,Special Puri,acting as a Neurosurgeon for [employer], DO.,have documented all relevant documentation on the behalf of Thomasene Lot, DO,as directed by  Thomasene Lot, DO while in the presence of Thomasene Lot, DO.   I, Thomasene Lot, DO, have reviewed all documentation for this visit. The documentation on 08/03/22 for the exam, diagnosis, procedures, and orders are all accurate and complete.

## 2022-08-04 ENCOUNTER — Encounter: Payer: Self-pay | Admitting: Neurology

## 2022-08-16 ENCOUNTER — Other Ambulatory Visit: Payer: Self-pay | Admitting: Neurology

## 2022-08-18 ENCOUNTER — Encounter (INDEPENDENT_AMBULATORY_CARE_PROVIDER_SITE_OTHER): Payer: Self-pay | Admitting: Family Medicine

## 2022-08-18 ENCOUNTER — Ambulatory Visit (INDEPENDENT_AMBULATORY_CARE_PROVIDER_SITE_OTHER): Payer: 59 | Admitting: Family Medicine

## 2022-08-18 VITALS — BP 153/77 | HR 71 | Temp 98.1°F | Ht 64.0 in | Wt 228.2 lb

## 2022-08-18 DIAGNOSIS — E559 Vitamin D deficiency, unspecified: Secondary | ICD-10-CM | POA: Diagnosis not present

## 2022-08-18 DIAGNOSIS — R030 Elevated blood-pressure reading, without diagnosis of hypertension: Secondary | ICD-10-CM | POA: Diagnosis not present

## 2022-08-18 DIAGNOSIS — R7303 Prediabetes: Secondary | ICD-10-CM

## 2022-08-18 DIAGNOSIS — Z6838 Body mass index (BMI) 38.0-38.9, adult: Secondary | ICD-10-CM

## 2022-08-18 MED ORDER — VITAMIN D (ERGOCALCIFEROL) 1.25 MG (50000 UNIT) PO CAPS: 50000.0000 [IU] | ORAL_CAPSULE | ORAL | 0 refills | Status: DC

## 2022-08-18 MED ORDER — METFORMIN HCL 500 MG PO TABS: ORAL_TABLET | ORAL | 0 refills | Status: DC

## 2022-08-18 NOTE — Progress Notes (Signed)
Amber Parrish, D.O.  ABFM, ABOM Specializing in Clinical Bariatric Medicine  Office located at: [address] [place], [place]  [address]     Assessment and Plan:   No orders of the defined types were placed in this encounter.   Medications Discontinued During This Encounter  Medication Reason   Vitamin D, Ergocalciferol, (DRISDOL) 1.25 MG (50000 UNIT) CAPS capsule Reorder   metFORMIN (GLUCOPHAGE) 500 MG tablet Reorder     Meds ordered this encounter  Medications   metFORMIN (GLUCOPHAGE) 500 MG tablet    Sig: 2 po with lunch and 1 with dinner daily    Dispense:  90 tablet    Refill:  0    30 d supply;  ** OV for RF **   Do not send RF request   Vitamin D, Ergocalciferol, (DRISDOL) 1.25 MG (50000 UNIT) CAPS capsule    Sig: Take 1 capsule (50,000 Units total) by mouth every 7 (seven) days.    Dispense:  4 capsule    Refill:  0    Patient will come in fasting 30 minutes prior for Repeat IC and labs (FLP, Insulin, Vitamin D, BMP) next OV  Pre-diabetes Assessment: Condition is improving, but not optimized.  Lab Results  Component Value Date   HGBA1C 5.5 05/14/2022   HGBA1C 5.5 09/18/2021   HGBA1C 5.7 (H) 04/24/2021   INSULIN 15.0 01/15/2022   INSULIN 11.4 09/18/2021   INSULIN 13.3 04/24/2021  - She reports good compliance and tolerance with Metformin 500 mg TID. She denies any GI side effects.   - She endorses that the Metformin is working and that on most days she is not having hunger/cravings.  Plan: - I recommended her to call her insurance about possible GLPs that she may be eligible for that can help with weight loss.  - Continue with med. Will refill this today.    - Continue her prudent nutritional plan that is low in simple carbohydrates, saturated fats and trans fats to goal of 5-10% weight loss to achieve significant health benefits.  Pt encouraged to continually advance exercise and cardiovascular fitness as tolerated throughout weight loss journey.     Vitamin D deficiency Assessment: Condition is improving, but not optimized. Lab Results  Component Value Date   VD25OH 48.5 05/14/2022   VD25OH 46.4 01/15/2022   VD25OH 42.9 09/18/2021  - No issues with Ergocalciferol 50K IU weekly. Denies any side effects.   Plan: - Continue with med. Will refill this today.    - Continue her prudent nutritional plan that is low in simple carbohydrates, saturated fats and trans fats to goal of 5-10% weight loss to achieve significant health benefits.  Pt encouraged to continually advance exercise and cardiovascular fitness as tolerated throughout weight loss journey.    Elevated BP without diagnosis of hypertension Assessment: Condition is Not optimized.  Last 3 blood pressure readings in our office are as follows: BP Readings from Last 3 Encounters:  08/18/22 (!) 153/77  07/30/22 136/64  07/23/22 129/89  - Patients elevated blood pressure reading today may likely be due to her not sleeping well due to stress from work and eating more salty foods/acholic beverages while traveling.  - She is asymptomatic, no concerns.   Plan: - Continue with Prudent nutritional plan and low sodium diet, advance exercise as tolerated.  - Ambulatory blood pressure monitoring encouraged.  Reminded patient that if they ever feel poorly in any way, to check their blood pressure and pulse as well. -  We will continue to monitor closely alongside PCP/ specialists.  Pt reminded to also f/up with those individuals as instructed by them.  - We will continue to monitor symptoms as they relate to the her weight loss journey.   TREATMENT PLAN FOR OBESITY: BMI 38.0-38.9,adult-current 39.15 Morbid obesity (HCC)-start bmi 42.23/start 04/24/21 Assessment: Condition is not optimized. Biometric data collected today, was reviewed with patient.  Fat mass has increased by 6.2 lb. Muscle mass has increased by 2.2 lb. Total body water has increased by 5.6 lb.   Plan:  Amber Parrish is  currently in the action stage of change. As such, her goal is to continue weight management plan. Amber Parrish will work on healthier eating habits and continue with practicing portion control and making smarter food choices, such as increasing vegetables and decreasing simple carbohydrates  Behavioral Intervention Additional resources provided today:  Portion control and smart choices handout Evidence-based interventions for health behavior change were utilized today including the discussion of self monitoring techniques, problem-solving barriers and SMART goal setting techniques.   Regarding patient's less desirable eating habits and patterns, we employed the technique of small changes.  Pt will specifically work on: continue adherence to prudent nutritional plan for next visit.    Recommended Physical Activity Goals Amber Parrish has been advised to work up to 150 minutes of moderate intensity aerobic activity a week and strengthening exercises 2-3 times per week for cardiovascular health, weight loss maintenance and preservation of muscle mass.  She has agreed to Think about ways to increase physical activity   FOLLOW UP: Return in about 3 weeks (around 09/08/2022). She was informed of the importance of frequent follow up visits to maximize her success with intensive lifestyle modifications for her multiple health conditions.   Subjective:   Chief complaint: Obesity Amber Parrish is here to discuss her progress with her obesity treatment plan. She is on the practicing portion control and making smarter food choices, such as increasing vegetables and decreasing simple carbohydrates and states she is following her eating plan approximately 60% of the time. She states she is walking 25-30 minutes 3 days per week.  Interval History:  Amber Parrish is here for a follow up office visit.  Since last office visit: - She endorses walking more and increasing her water intake.  - She states that it has not  been difficult to adjust to the Portion control smart choices meal plan.  - When traveling, she worked on making more mindful choices in restaurants. She does endorse increasing her alcoholic beverage intake while traveling.  - She endorses that her work has been stressful and has not been sleeping well.  - She has tried the Migraine Meditation on a few occasions and enjoyed it.    We reviewed her meal plan and all questions were answered. Patient's food recall appears to be accurate and consistent with what is on plan when she is following it. When eating on plan, her hunger and cravings are well controlled.      Pharmacotherapy for weight loss: She is currently taking  Metformin  for medical weight loss.  Denies side effects.    Review of Systems:  Pertinent positives were addressed with patient today.  Weight Summary and Biometrics   Weight Lost Since Last Visit: 0  Weight Gained Since Last Visit: 4 lb    Vitals Temp: 98.1 F (36.7 C) BP: (!) 153/77 Pulse Rate: 71 SpO2: 98 %   Anthropometric Measurements Height: 5\' 4"  (1.626 m) Weight: 228 lb 3.2  oz (103.5 kg) BMI (Calculated): 39.15 Weight at Last Visit: 224 lb Weight Lost Since Last Visit: 0 Weight Gained Since Last Visit: 4 lb Starting Weight: 246 lb Total Weight Loss (lbs): 18 lb (8.165 kg) Peak Weight: 258 lb   Body Composition  Body Fat %: 46.2 % Fat Mass (lbs): 105.4 lbs Muscle Mass (lbs): 116.6 lbs Total Body Water (lbs): 92.4 lbs Visceral Fat Rating : 13   Other Clinical Data Fasting: No Labs: No Today's Visit #: 19 Starting Date: 04/24/21   Objective:   PHYSICAL EXAM: Blood pressure (!) 153/77, pulse 71, temperature 98.1 F (36.7 C), height 5\' 4"  (1.626 m), weight 228 lb 3.2 oz (103.5 kg), SpO2 98 %. Body mass index is 39.17 kg/m.  General: Well Developed, well nourished, and in no acute distress.  HEENT: Normocephalic, atraumatic Skin: Warm and dry, cap RF less 2 sec, good turgor Chest:   Normal excursion, shape, no gross abn Respiratory: speaking in full sentences, no conversational dyspnea NeuroM-Sk: Ambulates w/o assistance, moves * 4 Psych: A and O *3, insight good, mood-full  DIAGNOSTIC DATA REVIEWED:  BMET    Component Value Date/Time   NA 140 05/14/2022 1000   K 4.9 05/14/2022 1000   CL 103 05/14/2022 1000   CO2 22 05/14/2022 1000   GLUCOSE 90 05/14/2022 1000   GLUCOSE 82 01/26/2011 1450   BUN 18 05/14/2022 1000   CREATININE 0.73 05/14/2022 1000   CREATININE 0.61 01/26/2011 1450   CALCIUM 10.4 (H) 05/14/2022 1000   GFRNONAA 106 07/03/2019 1156   GFRAA 122 07/03/2019 1156   Lab Results  Component Value Date   HGBA1C 5.5 05/14/2022   HGBA1C 5.7 (H) 07/03/2019   Lab Results  Component Value Date   INSULIN 15.0 01/15/2022   INSULIN 16.7 07/03/2019   Lab Results  Component Value Date   TSH 1.120 04/24/2021   CBC    Component Value Date/Time   WBC 4.4 04/24/2021 0906   WBC 5.8 01/26/2011 1450   RBC 4.63 04/24/2021 0906   RBC 4.17 01/26/2011 1450   HGB 13.5 04/24/2021 0906   HCT 41.5 04/24/2021 0906   PLT 270 04/24/2021 0906   MCV 90 04/24/2021 0906   MCH 29.2 04/24/2021 0906   MCH 30.5 01/26/2011 1450   MCHC 32.5 04/24/2021 0906   MCHC 33.1 01/26/2011 1450   RDW 13.1 04/24/2021 0906   Iron Studies No results found for: "IRON", "TIBC", "FERRITIN", "IRONPCTSAT" Lipid Panel     Component Value Date/Time   CHOL 186 01/15/2022 0855   TRIG 78 01/15/2022 0855   HDL 70 01/15/2022 0855   CHOLHDL 3.4 11/15/2019 1124   LDLCALC 102 (H) 01/15/2022 0855   Hepatic Function Panel     Component Value Date/Time   PROT 7.1 04/24/2021 0906   ALBUMIN 4.7 04/24/2021 0906   AST 21 04/24/2021 0906   ALT 23 04/24/2021 0906   ALKPHOS 77 04/24/2021 0906   BILITOT 0.4 04/24/2021 0906      Component Value Date/Time   TSH 1.120 04/24/2021 0906   Nutritional Lab Results  Component Value Date   VD25OH 48.5 05/14/2022   VD25OH 46.4 01/15/2022    VD25OH 42.9 09/18/2021    Attestations:   Reviewed by clinician on day of visit: allergies, medications, problem list, medical history, surgical history, family history, social history, and previous encounter notes.   I,Special Puri,acting as a scribe for [employer], DO.,have documented all relevant documentation on the behalf of Amber Lot, DO,as directed by  Amber Lot, DO while in the presence of Amber Lot, DO.   I, Amber Lot, DO, have reviewed all documentation for this visit. The documentation on 08/18/22 for the exam, diagnosis, procedures, and orders are all accurate and complete.

## 2022-08-21 ENCOUNTER — Ambulatory Visit
Admission: RE | Admit: 2022-08-21 | Discharge: 2022-08-21 | Disposition: A | Discharge status: Home / Self Care | Payer: 59 | Source: Ambulatory Visit | Attending: Neurology | Admitting: Neurology

## 2022-08-21 DIAGNOSIS — G932 Benign intracranial hypertension: Secondary | ICD-10-CM

## 2022-08-23 ENCOUNTER — Other Ambulatory Visit: Payer: Self-pay | Admitting: Neurology

## 2022-08-28 NOTE — Progress Notes (Signed)
Patient advised, Spinal set for 09/01/22.

## 2022-09-01 ENCOUNTER — Ambulatory Visit
Admission: RE | Admit: 2022-09-01 | Discharge: 2022-09-01 | Disposition: A | Discharge status: Home / Self Care | Payer: 59 | Source: Ambulatory Visit | Attending: Neurology | Admitting: Neurology

## 2022-09-01 ENCOUNTER — Other Ambulatory Visit (HOSPITAL_COMMUNITY)
Admission: RE | Admit: 2022-09-01 | Discharge: 2022-09-01 | Disposition: A | Discharge status: Home / Self Care | Payer: 59 | Source: Ambulatory Visit | Attending: Neurology | Admitting: Neurology

## 2022-09-01 DIAGNOSIS — G932 Benign intracranial hypertension: Secondary | ICD-10-CM

## 2022-09-01 LAB — CSF CELL COUNT WITH DIFFERENTIAL
RBC Count, CSF: 0 cells/uL
TOTAL NUCLEATED CELL: 3 cells/uL (ref 0–5)

## 2022-09-01 LAB — CSF CULTURE W GRAM STAIN: SPECIMEN QUALITY:: ADEQUATE

## 2022-09-01 NOTE — Discharge Instructions (Signed)

## 2022-09-02 ENCOUNTER — Other Ambulatory Visit: Payer: Self-pay

## 2022-09-02 LAB — PROTEIN, CSF: Total Protein, CSF: 32 mg/dL (ref 15–45)

## 2022-09-02 LAB — CYTOLOGY - NON PAP

## 2022-09-02 LAB — CSF CULTURE W GRAM STAIN

## 2022-09-02 MED ORDER — ACETAZOLAMIDE ER 500 MG PO CP12: 500.0000 mg | ORAL_CAPSULE | Freq: Two times a day (BID) | ORAL | Status: DC

## 2022-09-02 MED ORDER — ACETAZOLAMIDE ER 500 MG PO CP12: 500.0000 mg | ORAL_CAPSULE | Freq: Two times a day (BID) | ORAL | 1 refills | Status: DC

## 2022-09-03 ENCOUNTER — Telehealth: Payer: Self-pay | Admitting: Anesthesiology

## 2022-09-03 ENCOUNTER — Ambulatory Visit (INDEPENDENT_AMBULATORY_CARE_PROVIDER_SITE_OTHER): Payer: 59 | Admitting: Family Medicine

## 2022-09-03 DIAGNOSIS — G971 Other reaction to spinal and lumbar puncture: Secondary | ICD-10-CM

## 2022-09-03 NOTE — Telephone Encounter (Signed)
Per Dr.Jaffe Blood Patch okay to order.  Blood patch ordered. [facility] made aware.

## 2022-09-03 NOTE — Progress Notes (Incomplete)
Amber Parrish, D.O.  ABFM, ABOM Specializing in Clinical Bariatric Medicine  Office located at: [address] [place], [place]  [address]     Assessment and Plan:   No orders of the defined types were placed in this encounter.   There are no discontinued medications.   No orders of the defined types were placed in this encounter.    *** There are no diagnoses linked to this encounter.     TREATMENT PLAN FOR OBESITY: Assessment:  Amber Parrish is here to discuss her progress with her obesity treatment plan along with follow-up of her obesity related diagnoses. See Medical Weight Management Flowsheet for complete bioelectrical impedance results.  Condition is {docourse:29403:::1}. {BiometricData (Optional):29179}  Since last office visit on *** patient's Fat mass has {DID:29233} by ***lb. Muscle mass has {DID:29233} by ***lb. Total body water has {DID:29233} by ***lb.  Counseling done on how various foods will affect these numbers and how to maximize success  Total lbs lost to date: *** Total weight loss percentage to date: *** ( use https://www.[employer].si )   Plan:  [name] is currently in the action stage of change. As such, her goal is to continue her weight management plan. [name] will work on healthier eating habits and try to follow the {mealplan:29239} best they can.   Behavioral Intervention Additional resources provided today: {weightresources:29185} Evidence-based interventions for health behavior change were utilized today including the discussion of self monitoring techniques, problem-solving barriers and SMART goal setting techniques.   Regarding patient's less desirable eating habits and patterns, we employed the technique of small changes.  Pt will specifically work on: *** for next visit.    Recommended Physical Activity Goals  [name] has been advised to slowly work up to 150 minutes of moderate  intensity aerobic activity a week and strengthening exercises 2-3 times per week for cardiovascular health, weight loss maintenance and preservation of muscle mass.   She has agreed to {EMEXERCISE:28847::"Think about ways to increase daily physical activity and overcoming barriers to exercise"}  Pharmacotherapy Current Anti-obesity medications: ***. Reported side effects: ***. We discussed various medication options to help Brittanee with her weight loss efforts and we both agreed to ***.( or Patient prefers to not start any weight loss medications at this time.   FOLLOW UP: No follow-ups on file.*** She was informed of the importance of frequent follow up visits to maximize her success with intensive lifestyle modifications for her multiple health conditions.    Subjective:   Chief complaint: Obesity Amber Parrish is here to discuss her progress with her obesity treatment plan. She is on the {MWMwtlossportion/plan2:23431} and states she is following her eating plan approximately ***% of the time. She states she is exercising *** minutes *** days per week.  Interval History:  [name] is here for a follow up office visit.   This is patient's *** office visit with [place].    Since last office visit:  ***  We reviewed her meal plan and all questions were answered. Patient's food recall appears to be accurate and consistent with what is on plan when she is following it. When eating on plan, her hunger and cravings are well controlled.      Pharmacotherapy for weight loss: She {srtis (Optional):29129} currently taking {srtpreviousweightlossmeds (Optional):29124} for medical weight loss.  Denies side effects.    Review of Systems:  Pertinent positives were addressed with patient today.    Weight Summary and Biometrics   No data recorded No  data recorded ***  No data recorded No data recorded No data recorded No data recorded   Objective:   PHYSICAL EXAM: There were no vitals  taken for this visit. There is no height or weight on file to calculate BMI.  General: Well Developed, well nourished, and in no acute distress.  HEENT: Normocephalic, atraumatic Skin: Warm and dry, cap RF less 2 sec, good turgor Chest:  Normal excursion, shape, no gross abn Respiratory: speaking in full sentences, no conversational dyspnea NeuroM-Sk: Ambulates w/o assistance, moves * 4 Psych: A and O *3, insight good, mood-full  DIAGNOSTIC DATA REVIEWED:  BMET    Component Value Date/Time   NA 140 05/14/2022 1000   K 4.9 05/14/2022 1000   CL 103 05/14/2022 1000   CO2 22 05/14/2022 1000   GLUCOSE 90 05/14/2022 1000   GLUCOSE 82 01/26/2011 1450   BUN 18 05/14/2022 1000   CREATININE 0.73 05/14/2022 1000   CREATININE 0.61 01/26/2011 1450   CALCIUM 10.4 (H) 05/14/2022 1000   GFRNONAA 106 07/03/2019 1156   GFRAA 122 07/03/2019 1156   Lab Results  Component Value Date   HGBA1C 5.5 05/14/2022   HGBA1C 5.7 (H) 07/03/2019   Lab Results  Component Value Date   INSULIN 15.0 01/15/2022   INSULIN 16.7 07/03/2019   Lab Results  Component Value Date   TSH 1.120 04/24/2021   CBC    Component Value Date/Time   WBC 4.4 04/24/2021 0906   WBC 5.8 01/26/2011 1450   RBC 4.63 04/24/2021 0906   RBC 4.17 01/26/2011 1450   HGB 13.5 04/24/2021 0906   HCT 41.5 04/24/2021 0906   PLT 270 04/24/2021 0906   MCV 90 04/24/2021 0906   MCH 29.2 04/24/2021 0906   MCH 30.5 01/26/2011 1450   MCHC 32.5 04/24/2021 0906   MCHC 33.1 01/26/2011 1450   RDW 13.1 04/24/2021 0906   Iron Studies No results found for: "IRON", "TIBC", "FERRITIN", "IRONPCTSAT" Lipid Panel     Component Value Date/Time   CHOL 186 01/15/2022 0855   TRIG 78 01/15/2022 0855   HDL 70 01/15/2022 0855   CHOLHDL 3.4 11/15/2019 1124   LDLCALC 102 (H) 01/15/2022 0855   Hepatic Function Panel     Component Value Date/Time   PROT 7.1 04/24/2021 0906   ALBUMIN 4.7 04/24/2021 0906   AST 21 04/24/2021 0906   ALT 23  04/24/2021 0906   ALKPHOS 77 04/24/2021 0906   BILITOT 0.4 04/24/2021 0906      Component Value Date/Time   TSH 1.120 04/24/2021 0906   Nutritional Lab Results  Component Value Date   VD25OH 48.5 05/14/2022   VD25OH 46.4 01/15/2022   VD25OH 42.9 09/18/2021    Attestations:   Reviewed by clinician on day of visit: allergies, medications, problem list, medical history, surgical history, family history, social history, and previous encounter notes.   Patient was in the office today and time spent on visit including pre-visit chart review and post-visit care/coordination of care and electronic medical record documentation was *** minutes. 50% of the time was in face to face counseling of this patient's medical condition(s) and providing education on treatment options to include the first-line treatment of diet and lifestyle modification.   I,Special Puri,acting as a Neurosurgeon for [employer], DO.,have documented all relevant documentation on the behalf of Thomasene Lot, DO,as directed by  Thomasene Lot, DO while in the presence of Thomasene Lot, DO.   I, Thomasene Lot, DO, have reviewed all documentation for this visit.  The documentation on 09/03/22 for the exam, diagnosis, procedures, and orders are all accurate and complete.

## 2022-09-03 NOTE — Telephone Encounter (Signed)
Per patient the headache subsides when she lays down. But when she sits up the headache is bad.   Per Patient [facility] stated that if the order is in by 12 she can be seen today if not tomorrow.

## 2022-09-03 NOTE — Telephone Encounter (Signed)
Pt called stating she had a lumbar puncture 2 days ago, she has been having a severe headache and nausea since then. Is there anything she can take to help her.

## 2022-09-04 ENCOUNTER — Ambulatory Visit
Admission: RE | Admit: 2022-09-04 | Discharge: 2022-09-04 | Disposition: A | Discharge status: Home / Self Care | Payer: 59 | Source: Ambulatory Visit | Attending: Neurology | Admitting: Neurology

## 2022-09-04 DIAGNOSIS — G971 Other reaction to spinal and lumbar puncture: Secondary | ICD-10-CM

## 2022-09-04 MED ORDER — IOPAMIDOL (ISOVUE-M 200) INJECTION 41%
1.0000 mL | Freq: Once | INTRAMUSCULAR | Status: AC
  Administered 2022-09-04: 1 mL via EPIDURAL

## 2022-09-04 NOTE — Discharge Instructions (Signed)
Blood Patch Discharge Instructions ? ?Go home and rest quietly for the next 24 hours.  It is important to lie flat for the next 24 hours.  Get up only to go to the restroom.  You may lie in the bed or on a couch on your back, your stomach, your left side or your right side.  You may have one pillow under your head.  You may have pillows between your knees while you are on your side or under your knees while you are on your back. ? ?DO NOT drive today.  Recline the seat as far back as it will go, while still wearing your seat belt, on the way home. ? ?You may get up to go to the bathroom as needed.  You may sit up for 10 minutes to eat.  You may resume your normal diet and medications unless otherwise indicated.  Drink lots of extra fluids today and tomorrow..  ? ?You may resume normal activities after your 24 hours of bed rest is over; however, do not exert yourself strongly or do any heavy lifting tomorrow. ? ?Call your physician for a follow-up appointment.  ? ?If you have any questions  after you arrive home, please call [phone]. ? ?Discharge instructions have been explained to the patient.  The patient, or the person responsible for the patient, fully understands these instructions. ? ?   ?

## 2022-09-04 NOTE — Progress Notes (Signed)
20cc blood collected from pts right hand prior to blood patch procedure. Pt tolerated well. 1 successful attempt. Gauze and tape applied after.  

## 2022-09-05 LAB — GLUCOSE, CSF: Glucose, CSF: 63 mg/dL (ref 40–80)

## 2022-09-05 LAB — CSF CULTURE W GRAM STAIN
MICRO NUMBER:: 14953443
Result:: NO GROWTH

## 2022-09-05 LAB — CSF CELL COUNT WITH DIFFERENTIAL

## 2022-09-08 ENCOUNTER — Encounter: Payer: Self-pay | Admitting: Neurology

## 2022-09-13 ENCOUNTER — Other Ambulatory Visit (INDEPENDENT_AMBULATORY_CARE_PROVIDER_SITE_OTHER): Payer: Self-pay | Admitting: Family Medicine

## 2022-09-13 DIAGNOSIS — E559 Vitamin D deficiency, unspecified: Secondary | ICD-10-CM

## 2022-09-24 ENCOUNTER — Encounter (INDEPENDENT_AMBULATORY_CARE_PROVIDER_SITE_OTHER): Payer: Self-pay | Admitting: Family Medicine

## 2022-09-24 ENCOUNTER — Ambulatory Visit (INDEPENDENT_AMBULATORY_CARE_PROVIDER_SITE_OTHER): Payer: 59 | Admitting: Family Medicine

## 2022-09-24 VITALS — BP 125/82 | HR 71 | Temp 98.2°F | Ht 64.0 in | Wt 228.0 lb

## 2022-09-24 DIAGNOSIS — E559 Vitamin D deficiency, unspecified: Secondary | ICD-10-CM

## 2022-09-24 DIAGNOSIS — Z6839 Body mass index (BMI) 39.0-39.9, adult: Secondary | ICD-10-CM

## 2022-09-24 DIAGNOSIS — Z6838 Body mass index (BMI) 38.0-38.9, adult: Secondary | ICD-10-CM

## 2022-09-24 DIAGNOSIS — R7303 Prediabetes: Secondary | ICD-10-CM

## 2022-09-24 MED ORDER — VITAMIN D (ERGOCALCIFEROL) 1.25 MG (50000 UNIT) PO CAPS: 50000.0000 [IU] | ORAL_CAPSULE | ORAL | 0 refills | Status: DC

## 2022-09-24 MED ORDER — METFORMIN HCL 500 MG PO TABS
ORAL_TABLET | ORAL | 0 refills | Status: DC
Start: 2022-09-24 — End: 2022-11-05

## 2022-09-24 NOTE — Progress Notes (Signed)
Amber Parrish, D.O.  ABFM, ABOM Specializing in Clinical Bariatric Medicine  Office located at: [address] [place], [place]  [address]     Assessment and Plan:   Medications Discontinued During This Encounter  Medication Reason   metFORMIN (GLUCOPHAGE) 500 MG tablet Reorder   Vitamin D, Ergocalciferol, (DRISDOL) 1.25 MG (50000 UNIT) CAPS capsule Reorder     Meds ordered this encounter  Medications   metFORMIN (GLUCOPHAGE) 500 MG tablet    Sig: 2 po with lunch and 1 with dinner daily    Dispense:  90 tablet    Refill:  0    30 d supply;  ** OV for RF **   Do not send RF request   Vitamin D, Ergocalciferol, (DRISDOL) 1.25 MG (50000 UNIT) CAPS capsule    Sig: Take 1 capsule (50,000 Units total) by mouth every 7 (seven) days.    Dispense:  4 capsule    Refill:  0     Vitamin D deficiency Assessment: Condition is almost at goal. She has been compliant with Ergocalciferol 50K IU weekly. Denies any side effects.   Lab Results  Component Value Date   VD25OH 48.5 05/14/2022   VD25OH 46.4 01/15/2022   VD25OH 42.9 09/18/2021   Plan:Continue with OTC supplement. Will reorder this today.  Will continue to monitor levels regularly (every 3-4 mo on average) to keep levels within normal limits and prevent over supplementation.   Pre-diabetes Assessment: Condition is not optimized. Patient endorses that in the last two weeks, she has not been taking her Metformin because she was worried about how this drug would interact with the Diamox that was recently started by her neurologist Dr.Jaffe.   Lab Results  Component Value Date   HGBA1C 5.5 05/14/2022   HGBA1C 5.5 09/18/2021   HGBA1C 5.7 (H) 04/24/2021   INSULIN 15.0 01/15/2022   INSULIN 11.4 09/18/2021   INSULIN 13.3 04/24/2021   Plan: *** I discussed with pt that taking both the Diamox and Metformin will likely not put her at high risk for side effects, such as metabolic acidosis, due to her not being a diabetic.  Restart Metformin. I instructed pt to start with 1/2 tab at lunch and 1/2 tab at dinner for 3-5 days, and if tolerating well increase to TID.   - Amber Parrish will continue to work on weight loss, exercise, via their meal plan we devised to help decrease the risk of progressing to diabetes. Will continue to monitor condition closely alongside PCP   TREATMENT PLAN FOR OBESITY: BMI 38.0-38.9,adult-current BMI 39.12 Morbid obesity (HCC)-start bmi 42.23/start 04/24/21 Assessment:  Amber Parrish is here to discuss her progress with her obesity treatment plan along with follow-up of her obesity related diagnoses. See Medical Weight Management Flowsheet for complete bioelectrical impedance results.  Condition is stable.. Biometric data collected today, was reviewed with patient.   Since last office visit on 08/18/22 patient's  Muscle mass has increased by 3.6 lb. Fat mass has decreased by 3.2 lb. Total body water has decreased by 6 lb.  Counseling done on how various foods will affect these numbers and how to maximize success  Total lbs lost to date: 18 Total weight loss percentage to date: 7.32   Plan:  - Continue with practicing portion control and making smarter food choices, such as increasing vegetables and decreasing simple carbohydrates   Behavioral Intervention Additional resources provided today: Healthy Proteins for Weight Loss Handout Evidence-based interventions for health behavior change were utilized  today including the discussion of self monitoring techniques, problem-solving barriers and SMART goal setting techniques.   Regarding patient's less desirable eating habits and patterns, we employed the technique of small changes.  Pt will specifically work on: continue following the meal plan closely for next visit.    Recommended Physical Activity Goals  Amber Parrish has been advised to slowly work up to 150 minutes of moderate intensity aerobic activity a week and strengthening exercises  2-3 times per week for cardiovascular health, weight loss maintenance and preservation of muscle mass.   She has agreed to Continue current level of physical activity   FOLLOW UP: Return in 2-3 wks. She was informed of the importance of frequent follow up visits to maximize her success with intensive lifestyle modifications for her multiple health conditions.   Subjective:   Chief complaint: Obesity Amber Parrish is here to discuss her progress with her obesity treatment plan. She is on the practicing portion control and making smarter food choices, such as increasing vegetables and decreasing simple carbohydrates and states she is following her eating plan approximately 70% of the time. She states she is doing volleyball 60 minutes 1  days per week.  Interval History:  Amber Parrish is here for a follow up office visit.     Since last office visit:    - Had 2 blood patches. Reports that the second one was very painful and was bed-ridden for roughly 1-1.5 week.   - During this period, she could not exercise and endorses eating off plan foods.    - She reports that she can now return to doing regular physical activity.  - Her headaches symptoms have improved.   - Has not taken Metformin in the past two weeks because she was apprehensive about the possible negative interactions between the Metformin and Diamox, which was recently prescribed per Dr.Jaffe.   Review of Systems:  Pertinent positives were addressed with patient today.  Weight Summary and Biometrics   Weight Lost Since Last Visit: 0  Weight Gained Since Last Visit: 0    Vitals Temp: 98.2 F (36.8 C) BP: 125/82 Pulse Rate: 71 SpO2: 97 %   Anthropometric Measurements Height: 5\' 4"  (1.626 m) Weight: 228 lb (103.4 kg) BMI (Calculated): 39.12 Weight at Last Visit: 228lb Weight Lost Since Last Visit: 0 Weight Gained Since Last Visit: 0 Starting Weight: 246lb Total Weight Loss (lbs): 18 lb (8.165 kg) Peak  Weight: 258lb   Body Composition  Body Fat %: 44.7 % Fat Mass (lbs): 102.2 lbs Muscle Mass (lbs): 120.2 lbs Total Body Water (lbs): 86.4 lbs Visceral Fat Rating : 13   Other Clinical Data Fasting: no Labs: no Today's Visit #: 20 Starting Date: 04/24/21    Objective:   PHYSICAL EXAM: Blood pressure 125/82, pulse 71, temperature 98.2 F (36.8 C), height 5\' 4"  (1.626 m), weight 228 lb (103.4 kg), SpO2 97 %. Body mass index is 39.14 kg/m.  General: Well Developed, well nourished, and in no acute distress.  HEENT: Normocephalic, atraumatic Skin: Warm and dry, cap RF less 2 sec, good turgor Chest:  Normal excursion, shape, no gross abn Respiratory: speaking in full sentences, no conversational dyspnea NeuroM-Sk: Ambulates w/o assistance, moves * 4 Psych: A and O *3, insight good, mood-full  DIAGNOSTIC DATA REVIEWED:  BMET    Component Value Date/Time   NA 140 05/14/2022 1000   K 4.9 05/14/2022 1000   CL 103 05/14/2022 1000   CO2 22 05/14/2022 1000   GLUCOSE 90 05/14/2022  1000   GLUCOSE 82 01/26/2011 1450   BUN 18 05/14/2022 1000   CREATININE 0.73 05/14/2022 1000   CREATININE 0.61 01/26/2011 1450   CALCIUM 10.4 (H) 05/14/2022 1000   GFRNONAA 106 07/03/2019 1156   GFRAA 122 07/03/2019 1156   Lab Results  Component Value Date   HGBA1C 5.5 05/14/2022   HGBA1C 5.7 (H) 07/03/2019   Lab Results  Component Value Date   INSULIN 15.0 01/15/2022   INSULIN 16.7 07/03/2019   Lab Results  Component Value Date   TSH 1.120 04/24/2021   CBC    Component Value Date/Time   WBC 4.4 04/24/2021 0906   WBC 5.8 01/26/2011 1450   RBC 4.63 04/24/2021 0906   RBC 4.17 01/26/2011 1450   HGB 13.5 04/24/2021 0906   HCT 41.5 04/24/2021 0906   PLT 270 04/24/2021 0906   MCV 90 04/24/2021 0906   MCH 29.2 04/24/2021 0906   MCH 30.5 01/26/2011 1450   MCHC 32.5 04/24/2021 0906   MCHC 33.1 01/26/2011 1450   RDW 13.1 04/24/2021 0906   Iron Studies No results found for: "IRON",  "TIBC", "FERRITIN", "IRONPCTSAT" Lipid Panel     Component Value Date/Time   CHOL 186 01/15/2022 0855   TRIG 78 01/15/2022 0855   HDL 70 01/15/2022 0855   CHOLHDL 3.4 11/15/2019 1124   LDLCALC 102 (H) 01/15/2022 0855   Hepatic Function Panel     Component Value Date/Time   PROT 7.1 04/24/2021 0906   ALBUMIN 4.7 04/24/2021 0906   AST 21 04/24/2021 0906   ALT 23 04/24/2021 0906   ALKPHOS 77 04/24/2021 0906   BILITOT 0.4 04/24/2021 0906      Component Value Date/Time   TSH 1.120 04/24/2021 0906   Nutritional Lab Results  Component Value Date   VD25OH 48.5 05/14/2022   VD25OH 46.4 01/15/2022   VD25OH 42.9 09/18/2021    Attestations:   Reviewed by clinician on day of visit: allergies, medications, problem list, medical history, surgical history, family history, social history, and previous encounter notes.   I,Special Puri,acting as a Neurosurgeon for [employer], DO.,have documented all relevant documentation on the behalf of Amber Lot, DO,as directed by  Amber Lot, DO while in the presence of Amber Lot, DO.   I, Amber Lot, DO, have reviewed all documentation for this visit. The documentation on 09/24/22 for the exam, diagnosis, procedures, and orders are all accurate and complete.

## 2022-09-25 ENCOUNTER — Other Ambulatory Visit: Payer: Self-pay | Admitting: Neurology

## 2022-10-15 ENCOUNTER — Ambulatory Visit (INDEPENDENT_AMBULATORY_CARE_PROVIDER_SITE_OTHER): Payer: 59 | Admitting: Family Medicine

## 2022-10-19 ENCOUNTER — Other Ambulatory Visit (INDEPENDENT_AMBULATORY_CARE_PROVIDER_SITE_OTHER): Payer: Self-pay | Admitting: Family Medicine

## 2022-10-19 DIAGNOSIS — E559 Vitamin D deficiency, unspecified: Secondary | ICD-10-CM

## 2022-11-05 ENCOUNTER — Other Ambulatory Visit (INDEPENDENT_AMBULATORY_CARE_PROVIDER_SITE_OTHER): Payer: Self-pay | Admitting: Family Medicine

## 2022-11-05 ENCOUNTER — Ambulatory Visit (INDEPENDENT_AMBULATORY_CARE_PROVIDER_SITE_OTHER): Payer: 59 | Admitting: Family Medicine

## 2022-11-05 ENCOUNTER — Encounter (INDEPENDENT_AMBULATORY_CARE_PROVIDER_SITE_OTHER): Payer: Self-pay | Admitting: Family Medicine

## 2022-11-05 VITALS — BP 130/63 | HR 67 | Temp 97.8°F | Ht 64.0 in | Wt 230.0 lb

## 2022-11-05 DIAGNOSIS — F39 Unspecified mood [affective] disorder: Secondary | ICD-10-CM

## 2022-11-05 DIAGNOSIS — Z72820 Sleep deprivation: Secondary | ICD-10-CM

## 2022-11-05 DIAGNOSIS — E559 Vitamin D deficiency, unspecified: Secondary | ICD-10-CM | POA: Diagnosis not present

## 2022-11-05 DIAGNOSIS — Z6839 Body mass index (BMI) 39.0-39.9, adult: Secondary | ICD-10-CM

## 2022-11-05 DIAGNOSIS — R519 Headache, unspecified: Secondary | ICD-10-CM

## 2022-11-05 DIAGNOSIS — R7303 Prediabetes: Secondary | ICD-10-CM

## 2022-11-05 DIAGNOSIS — Z6838 Body mass index (BMI) 38.0-38.9, adult: Secondary | ICD-10-CM

## 2022-11-05 MED ORDER — TOPIRAMATE ER 50 MG PO CAP24
ORAL_CAPSULE | ORAL | 0 refills | Status: DC
Start: 2022-11-05 — End: 2022-11-12

## 2022-11-05 MED ORDER — METFORMIN HCL 500 MG PO TABS: ORAL_TABLET | ORAL | 0 refills | Status: DC

## 2022-11-05 MED ORDER — VITAMIN D (ERGOCALCIFEROL) 1.25 MG (50000 UNIT) PO CAPS: 50000.0000 [IU] | ORAL_CAPSULE | ORAL | 0 refills | Status: DC

## 2022-11-05 NOTE — Progress Notes (Signed)
Amber Parrish, D.O.  ABFM, ABOM Specializing in Clinical Bariatric Medicine  Office located at: [address] [place], [place]  [address]     Assessment and Plan:   Orders Placed This Encounter  Procedures   Insulin, random   Hemoglobin A1c   VITAMIN D 25 Hydroxy (Vit-D Deficiency, Fractures)   Vitamin B12   Magnesium   Comprehensive metabolic panel    Medications Discontinued During This Encounter  Medication Reason   metFORMIN (GLUCOPHAGE) 500 MG tablet Reorder   Vitamin D, Ergocalciferol, (DRISDOL) 1.25 MG (50000 UNIT) CAPS capsule Reorder     Meds ordered this encounter  Medications   Vitamin D, Ergocalciferol, (DRISDOL) 1.25 MG (50000 UNIT) CAPS capsule    Sig: Take 1 capsule (50,000 Units total) by mouth every 7 (seven) days.    Dispense:  4 capsule    Refill:  0   metFORMIN (GLUCOPHAGE) 500 MG tablet    Sig: 2 po with lunch and 1 with dinner daily    Dispense:  90 tablet    Refill:  0    30 d supply;  ** OV for RF **   Do not send RF request   Topiramate ER (TROKENDI XR) 50 MG CP24    Sig: 1 po qd    Dispense:  30 capsule    Refill:  0    Mood disorder (HCC), with emotional eating/ Non intractable headache, unspecified chronicity pattern, unspecified headache type Poor sleep Assessment: Denies any SI/HI. Mood is stable today. Reports that headaches are a persistent issue. Endorses having care-giver stress - pt is taking care of her mother.  Because of her care giving responsibilities and busy schedule, pt reports not sleeping well and sometimes mindlessly eating/overeating. Dr.Jaffe had Amber Parrish on Trokendi around 7/23, but med was discontinued because it was not helping with headaches.  Plan: Start Trokendi XR 50 mg 1 po every day. Behavior modification techniques were also discussed today to help deal with emotional/ non-hunger eating behaviors including but not limited to exercise for stress management and self care activities like adequate sleep  (7-9 hrs/nite). Reminded patient of the importance of following their prudent nutrition plan and how food can affect mood as well to support emotional wellbeing. We will continue to monitor closely alongside PCP / other specialists.    Pre-diabetes Assessment: Condition is being treated with Metformin 500 mg two po with lunch and 1 with dinner. She is responding to medication well and denies any N/V/D. Endorses sometimes mindlessly eating/overeating when stressed and or busy.   Lab Results  Component Value Date   HGBA1C 5.5 05/14/2022   HGBA1C 5.5 09/18/2021   HGBA1C 5.7 (H) 04/24/2021   INSULIN 15.0 01/15/2022   INSULIN 11.4 09/18/2021   INSULIN 13.3 04/24/2021    Plan: Check labs today. Continue her prudent nutritional plan that is low in simple carbohydrates, saturated fats and trans fats to goal of 5-10% weight loss to achieve significant health benefits.  Pt encouraged to continually advance exercise and cardiovascular fitness as tolerated throughout weight loss journey. Will continue to monitor condition alongside PCP/specialists as it relates to their weight loss journey    Vitamin D deficiency Assessment: Her Vit D deficiency is being treated with Ergocalciferol 50K IU weekly- tolerating supplement well, no concerns.  Lab Results  Component Value Date   VD25OH 48.5 05/14/2022   VD25OH 46.4 01/15/2022   VD25OH 42.9 09/18/2021   Plan: Recheck labs today. Continue with meal plan, their weight loss  efforts, and ERGO at current dose to further improve this condition. Will refill ERGO today.    TREATMENT PLAN FOR OBESITY: BMI 38.0-38.9,adult-current BMI 39.46 Morbid obesity (HCC)-start bmi 42.23/start 04/24/21 Assessment: Amber Parrish is here to discuss her progress with her obesity treatment plan along with follow-up of her obesity related diagnoses. See Medical Weight Management Flowsheet for complete bioelectrical impedance results.  Condition is improving. Biometric data  collected today, was reviewed with patient.   Since last office visit on 09/24/22 patient's  Muscle mass has increased by 6 lb. Fat mass has decreased by 4.8 lb. Total body water has increased by 0.6 lb.  Counseling done on how various foods will affect these numbers and how to maximize success  Total lbs lost to date: 16 lbs Total weight loss percentage to date: 6.50%    Plan:  I discussed with pt the potential benefits of tracking the foods they eat and drink. Journaling can help one further understand their eating habits and patterns and it's a great tool to make more conscious meal choices throughout the day.  We both agreed to switch her from Portion control Smart Choices to journaling 1550-1650 calories and 110+ grams protein daily.   Behavioral Intervention Additional resources provided today: Food journaling plan information and Mindful Eating handout Evidence-based interventions for health behavior change were utilized today including the discussion of self monitoring techniques, problem-solving barriers and SMART goal setting techniques.   Regarding patient's less desirable eating habits and patterns, we employed the technique of small changes.  Pt will specifically work on: journaling her intake/bringing in food log and practicing mindful eating exercises for next visit.    Recommended Physical Activity Goals  Amber Parrish has been advised to slowly work up to 150 minutes of moderate intensity aerobic activity a week and strengthening exercises 2-3 times per week for cardiovascular health, weight loss maintenance and preservation of muscle mass.   She has agreed to Continue current level of physical activity   FOLLOW UP: Return in about 3 weeks (around 11/26/2022). She was informed of the importance of frequent follow up visits to maximize her success with intensive lifestyle modifications for her multiple health conditions.  Amber Parrish is aware that we will review all of her lab  results at our next visit.  She is aware that if anything is critical/ life threatening with the results, we will be contacting her via MyChart prior to the office visit to discuss management.    Subjective:   Chief complaint: Obesity Amber Parrish is here to discuss her progress with her obesity treatment plan. She is practicing portion control and making smarter food choices, such as increasing vegetables and decreasing simple carbohydrates and states she is following her eating plan approximately 60% of the time. She states she is not exercising.   Interval History:  Amber Parrish is here for a follow up office visit. Since last OV, Amber Parrish has been doing okay. She endorses being stressed due to caregiver responsibilities. She has not been sleeping well. In terms of her foods, she is working on being more mindful and increasing her protein intake.   Pharmacotherapy for weight loss: She is currently taking  Metformin  for medical weight loss.  Denies side effects.    Review of Systems:  Pertinent positives were addressed with patient today.  Reviewed by clinician on day of visit: allergies, medications, problem list, medical history, surgical history, family history, social history, and previous encounter notes.  Weight Summary and Biometrics  Weight Lost Since Last Visit: 0lb  Weight Gained Since Last Visit: 2lb    Vitals Temp: 97.8 F (36.6 C) BP: 130/63 Pulse Rate: 67 SpO2: 99 %   Anthropometric Measurements Height: 5\' 4"  (1.626 m) Weight: 230 lb (104.3 kg) BMI (Calculated): 39.46 Weight at Last Visit: 228lb Weight Lost Since Last Visit: 0lb Weight Gained Since Last Visit: 2lb Starting Weight: 246lb Total Weight Loss (lbs): 16 lb (7.258 kg) Peak Weight: 258lb   Body Composition  Body Fat %: 42.3 % Fat Mass (lbs): 97.4 lbs Muscle Mass (lbs): 126.2 lbs Total Body Water (lbs): 87 lbs Visceral Fat Rating : 12   Other Clinical Data Fasting: yes Labs: no Today's  Visit #: 21 Starting Date: 04/24/21   Objective:   PHYSICAL EXAM: Blood pressure 130/63, pulse 67, temperature 97.8 F (36.6 C), height 5\' 4"  (1.626 m), weight 230 lb (104.3 kg), SpO2 99%. Body mass index is 39.48 kg/m.  General: Well Developed, well nourished, and in no acute distress.  HEENT: Normocephalic, atraumatic Skin: Warm and dry, cap RF less 2 sec, good turgor Chest:  Normal excursion, shape, no gross abn Respiratory: speaking in full sentences, no conversational dyspnea NeuroM-Sk: Ambulates w/o assistance, moves * 4 Psych: A and O *3, insight good, mood-full  DIAGNOSTIC DATA REVIEWED:  BMET    Component Value Date/Time   NA 140 05/14/2022 1000   K 4.9 05/14/2022 1000   CL 103 05/14/2022 1000   CO2 22 05/14/2022 1000   GLUCOSE 90 05/14/2022 1000   GLUCOSE 82 01/26/2011 1450   BUN 18 05/14/2022 1000   CREATININE 0.73 05/14/2022 1000   CREATININE 0.61 01/26/2011 1450   CALCIUM 10.4 (H) 05/14/2022 1000   GFRNONAA 106 07/03/2019 1156   GFRAA 122 07/03/2019 1156   Lab Results  Component Value Date   HGBA1C 5.5 05/14/2022   HGBA1C 5.7 (H) 07/03/2019   Lab Results  Component Value Date   INSULIN 15.0 01/15/2022   INSULIN 16.7 07/03/2019   Lab Results  Component Value Date   TSH 1.120 04/24/2021   CBC    Component Value Date/Time   WBC 4.4 04/24/2021 0906   WBC 5.8 01/26/2011 1450   RBC 4.63 04/24/2021 0906   RBC 4.17 01/26/2011 1450   HGB 13.5 04/24/2021 0906   HCT 41.5 04/24/2021 0906   PLT 270 04/24/2021 0906   MCV 90 04/24/2021 0906   MCH 29.2 04/24/2021 0906   MCH 30.5 01/26/2011 1450   MCHC 32.5 04/24/2021 0906   MCHC 33.1 01/26/2011 1450   RDW 13.1 04/24/2021 0906   Iron Studies No results found for: "IRON", "TIBC", "FERRITIN", "IRONPCTSAT" Lipid Panel     Component Value Date/Time   CHOL 186 01/15/2022 0855   TRIG 78 01/15/2022 0855   HDL 70 01/15/2022 0855   CHOLHDL 3.4 11/15/2019 1124   LDLCALC 102 (H) 01/15/2022 0855    Hepatic Function Panel     Component Value Date/Time   PROT 7.1 04/24/2021 0906   ALBUMIN 4.7 04/24/2021 0906   AST 21 04/24/2021 0906   ALT 23 04/24/2021 0906   ALKPHOS 77 04/24/2021 0906   BILITOT 0.4 04/24/2021 0906      Component Value Date/Time   TSH 1.120 04/24/2021 0906   Nutritional Lab Results  Component Value Date   VD25OH 48.5 05/14/2022   VD25OH 46.4 01/15/2022   VD25OH 42.9 09/18/2021    Attestations:   I, Special Puri , acting as a Stage manager for [employer], DO., have compiled  all relevant documentation for today's office visit on behalf of Thomasene Lot, DO, while in the presence of [employer], DO.  I have reviewed the above documentation for accuracy and completeness, and I agree with the above. Amber Parrish, D.O.  The 21st Century Cures Act was signed into law in 2016 which includes the topic of electronic health records.  This provides immediate access to information in MyChart.  This includes consultation notes, operative notes, office notes, lab results and pathology reports.  If you have any questions about what you read please let us know at your next visit so we can discuss your concerns and take corrective action if need be.  We are right here with you.

## 2022-11-06 LAB — COMPREHENSIVE METABOLIC PANEL
AST: 19 IU/L (ref 0–40)
Albumin: 4.9 g/dL (ref 3.9–4.9)
Alkaline Phosphatase: 87 IU/L (ref 44–121)
BUN/Creatinine Ratio: 25 — ABNORMAL HIGH (ref 9–23)
CO2: 21 mmol/L (ref 20–29)
Calcium: 10.1 mg/dL (ref 8.7–10.2)
Creatinine, Ser: 0.8 mg/dL (ref 0.57–1.00)
Globulin, Total: 2.7 g/dL (ref 1.5–4.5)
Potassium: 4.8 mmol/L (ref 3.5–5.2)

## 2022-11-06 LAB — HEMOGLOBIN A1C: Hgb A1c MFr Bld: 6 % — ABNORMAL HIGH (ref 4.8–5.6)

## 2022-11-06 LAB — INSULIN, RANDOM

## 2022-11-06 LAB — MAGNESIUM: Magnesium: 2.3 mg/dL (ref 1.6–2.3)

## 2022-11-07 LAB — COMPREHENSIVE METABOLIC PANEL
ALT: 15 IU/L (ref 0–32)
BUN: 20 mg/dL (ref 6–24)
Bilirubin Total: 0.5 mg/dL (ref 0.0–1.2)
Chloride: 105 mmol/L (ref 96–106)
Glucose: 95 mg/dL (ref 70–99)
Sodium: 140 mmol/L (ref 134–144)
Total Protein: 7.6 g/dL (ref 6.0–8.5)
eGFR: 90 mL/min/{1.73_m2} (ref >59)

## 2022-11-07 LAB — VITAMIN B12: Vitamin B-12: 872 pg/mL (ref 232–1245)

## 2022-11-07 LAB — HEMOGLOBIN A1C: Est. average glucose Bld gHb Est-mCnc: 126 mg/dL

## 2022-11-07 LAB — VITAMIN D 25 HYDROXY (VIT D DEFICIENCY, FRACTURES): Vit D, 25-Hydroxy: 44.4 ng/mL (ref 30.0–100.0)

## 2022-11-10 ENCOUNTER — Encounter (INDEPENDENT_AMBULATORY_CARE_PROVIDER_SITE_OTHER): Payer: Self-pay | Admitting: Family Medicine

## 2022-11-10 DIAGNOSIS — R7303 Prediabetes: Secondary | ICD-10-CM

## 2022-11-10 DIAGNOSIS — G43809 Other migraine, not intractable, without status migrainosus: Secondary | ICD-10-CM

## 2022-11-16 ENCOUNTER — Other Ambulatory Visit (INDEPENDENT_AMBULATORY_CARE_PROVIDER_SITE_OTHER): Payer: Self-pay | Admitting: Family Medicine

## 2022-11-16 MED ORDER — METFORMIN HCL 500 MG PO TABS: ORAL_TABLET | ORAL | 0 refills | Status: DC

## 2022-11-16 MED ORDER — TOPIRAMATE ER 50 MG PO CAP24: ORAL_CAPSULE | ORAL | 0 refills | Status: DC

## 2022-11-30 ENCOUNTER — Encounter: Payer: Self-pay | Admitting: Neurology

## 2022-12-01 ENCOUNTER — Other Ambulatory Visit (INDEPENDENT_AMBULATORY_CARE_PROVIDER_SITE_OTHER): Payer: Self-pay | Admitting: Family Medicine

## 2022-12-01 DIAGNOSIS — E559 Vitamin D deficiency, unspecified: Secondary | ICD-10-CM

## 2022-12-10 ENCOUNTER — Ambulatory Visit (INDEPENDENT_AMBULATORY_CARE_PROVIDER_SITE_OTHER): Payer: 59 | Admitting: Family Medicine

## 2023-01-05 ENCOUNTER — Ambulatory Visit (INDEPENDENT_AMBULATORY_CARE_PROVIDER_SITE_OTHER): Payer: 59 | Admitting: Family Medicine

## 2023-01-05 ENCOUNTER — Encounter (INDEPENDENT_AMBULATORY_CARE_PROVIDER_SITE_OTHER): Payer: Self-pay | Admitting: Family Medicine

## 2023-01-05 VITALS — BP 132/70 | HR 70 | Temp 98.3°F | Ht 64.0 in | Wt 232.0 lb

## 2023-01-05 DIAGNOSIS — R519 Headache, unspecified: Secondary | ICD-10-CM

## 2023-01-05 DIAGNOSIS — F39 Unspecified mood [affective] disorder: Secondary | ICD-10-CM | POA: Diagnosis not present

## 2023-01-05 DIAGNOSIS — R7303 Prediabetes: Secondary | ICD-10-CM

## 2023-01-05 DIAGNOSIS — E559 Vitamin D deficiency, unspecified: Secondary | ICD-10-CM | POA: Diagnosis not present

## 2023-01-05 DIAGNOSIS — Z6838 Body mass index (BMI) 38.0-38.9, adult: Secondary | ICD-10-CM

## 2023-01-05 DIAGNOSIS — Z6839 Body mass index (BMI) 39.0-39.9, adult: Secondary | ICD-10-CM

## 2023-01-05 MED ORDER — VITAMIN D (ERGOCALCIFEROL) 1.25 MG (50000 UNIT) PO CAPS: 50000.0000 [IU] | ORAL_CAPSULE | ORAL | 0 refills | Status: DC

## 2023-01-05 NOTE — Progress Notes (Signed)
Amber Parrish, D.O.  ABFM, ABOM Specializing in Clinical Bariatric Medicine  Office located at: [address], [place]  [address]     Assessment and Plan:   No orders of the defined types were placed in this encounter.   Medications Discontinued During This Encounter  Medication Reason   Vitamin D, Ergocalciferol, (DRISDOL) 1.25 MG (50000 UNIT) CAPS capsule Reorder     Meds ordered this encounter  Medications   Vitamin D, Ergocalciferol, (DRISDOL) 1.25 MG (50000 UNIT) CAPS capsule    Sig: Take 1 capsule (50,000 Units total) by mouth every 7 (seven) days.    Dispense:  4 capsule    Refill:  0     Pre-diabetes Assessment & Plan: Lab Results  Component Value Date   HGBA1C 6.0 (H) 11/05/2022   HGBA1C 5.5 05/14/2022   HGBA1C 5.5 09/18/2021   INSULIN 13.1 11/05/2022   INSULIN 15.0 01/15/2022   INSULIN 11.4 09/18/2021   Lab Results  Component Value Date   CREATININE 0.80 11/05/2022   BUN 20 11/05/2022   NA 140 11/05/2022   K 4.8 11/05/2022   CL 105 11/05/2022   CO2 21 11/05/2022      Component Value Date/Time   PROT 7.6 11/05/2022 1449   ALBUMIN 4.9 11/05/2022 1449   AST 19 11/05/2022 1449   ALT 15 11/05/2022 1449   ALKPHOS 87 11/05/2022 1449   BILITOT 0.5 11/05/2022 1449   Lab Results  Component Value Date   VITAMINB12 872 11/05/2022   Prediabetes treated with Metformin 500 mg tid - denies any adverse effects. A1c has worsened from 5.5 to 6.0. Fasting insulin has slightly improved from 15 to 13.1. BUN/creatine ratio slightly elevated, other electrolytes & liver enzymes are within normal limits. B12 levels are within recommended limits of 500+.   Maintain with Metformin at current dose. Drink at least half of your weight in ounces of water per day unless otherwise noted by one of your doctors that you must restrict water intake.  Continue to decrease simple carbs/ sugars; increase fiber and proteins -> follow her meal plan.  We will recheck  labs in approximately 3 months from last check, or as deemed appropriate.    Vitamin D deficiency Assessment & Plan: Lab Results  Component Value Date   VD25OH 44.4 11/05/2022   VD25OH 48.5 05/14/2022   VD25OH 46.4 01/15/2022   Condition treated with ERGO 50,000 units once a wk, pt reports taking it on a consistent basis. Vitamin D levels have decreased from 48.5 to 44.4. Continue with their weight loss efforts and ERGO at current dose - Will refill today.      Mood disorder (HCC), with emotional eating/ Nonintractable headache, unspecified chronicity pattern, unspecified headache type Assessment & Plan: Denies any SI/HI. Pt endorses being stressed with work and taking care of her mother. Reports         BMI 38.0-38.9,adult-current BMI 39.8 Morbid obesity (HCC)-start bmi 42.23/start 04/24/21 Assessment & Plan: Since last office visit on 11/05/22 patient's muscle mass has decreased by 4.6 lb. Fat mass has increased by 7.2 lb. Total body water has decreased by 0.4 lb.  Counseling done on how various foods will affect these numbers and how to maximize success  Total lbs lost to date: 14 lbs Total weight loss percentage to date:  5.69%  No change to meal plan - see Subjective  Recommended pt to try Whole 30 & [employer] prepared meals.    Behavioral Intervention Additional resources provided  today: NA Evidence-based interventions for health behavior change were utilized today including the discussion of self monitoring techniques, problem-solving barriers and SMART goal setting techniques.   Regarding patient's less desirable eating habits and patterns, we employed the technique of small changes.  Pt will specifically work on: journaling her intake/bringing in food log.  for next visit.    FOLLOW UP: Return 02/04/23. She was informed of the importance of frequent follow up visits to maximize her success with intensive lifestyle modifications for her multiple health  conditions.  Subjective:   Chief complaint: Obesity Amber Parrish is here to discuss her progress with her obesity treatment plan. She is keeping a food journal and adhering to recommended goals of *** calories and *** protein and states she is following her eating plan approximately 50% of the time. She states she is playing volleyball and walking 2 miles, 2 days a wk.   Interval History:  Amber Parrish is here for a follow up office visit. Since last OV,        Pharmacotherapy for weight loss: She is currently taking  Metformin 500 mg tid & Trokendi XR 50 mg 1 po qd  for medical weight loss.  Denies side effects.    Review of Systems:  Pertinent positives were addressed with patient today.  Reviewed by clinician on day of visit: allergies, medications, problem list, medical history, surgical history, family history, social history, and previous encounter notes.  Weight Summary and Biometrics   Weight Lost Since Last Visit: 0  Weight Gained Since Last Visit: 2 lb   Vitals Temp: 98.3 F (36.8 C) BP: 132/70 Pulse Rate: 70 SpO2: 99 %   Anthropometric Measurements Height: 5\' 4"  (1.626 m) Weight: 232 lb (105.2 kg) BMI (Calculated): 39.8 Weight at Last Visit: 230 lb Weight Lost Since Last Visit: 0 Weight Gained Since Last Visit: 2 lb Starting Weight: 246 lb Total Weight Loss (lbs): 14 lb (6.35 kg) Peak Weight: 258 lb   Body Composition  Body Fat %: 45 % Fat Mass (lbs): 104.6 lbs Muscle Mass (lbs): 121.6 lbs Total Body Water (lbs): 86.6 lbs Visceral Fat Rating : 13   Other Clinical Data Fasting: yes Labs: no Today's Visit #: 22 Starting Date: 04/24/21   Objective:   PHYSICAL EXAM: Blood pressure 132/70, pulse 70, temperature 98.3 F (36.8 C), height 5\' 4"  (1.626 m), weight 232 lb (105.2 kg), SpO2 99%. Body mass index is 39.82 kg/m.  General: Well Developed, well nourished, and in no acute distress.  HEENT: Normocephalic, atraumatic Skin: Warm and dry,  cap RF less 2 sec, good turgor Chest:  Normal excursion, shape, no gross abn Respiratory: speaking in full sentences, no conversational dyspnea NeuroM-Sk: Ambulates w/o assistance, moves * 4 Psych: A and O *3, insight good, mood-full  DIAGNOSTIC DATA REVIEWED:  BMET    Component Value Date/Time   NA 140 11/05/2022 1449   K 4.8 11/05/2022 1449   CL 105 11/05/2022 1449   CO2 21 11/05/2022 1449   GLUCOSE 95 11/05/2022 1449   GLUCOSE 82 01/26/2011 1450   BUN 20 11/05/2022 1449   CREATININE 0.80 11/05/2022 1449   CREATININE 0.61 01/26/2011 1450   CALCIUM 10.1 11/05/2022 1449   GFRNONAA 106 07/03/2019 1156   GFRAA 122 07/03/2019 1156   Lab Results  Component Value Date   HGBA1C 6.0 (H) 11/05/2022   HGBA1C 5.7 (H) 07/03/2019   Lab Results  Component Value Date   INSULIN 13.1 11/05/2022   INSULIN 16.7 07/03/2019  Lab Results  Component Value Date   TSH 1.120 04/24/2021   CBC    Component Value Date/Time   WBC 4.4 04/24/2021 0906   WBC 5.8 01/26/2011 1450   RBC 4.63 04/24/2021 0906   RBC 4.17 01/26/2011 1450   HGB 13.5 04/24/2021 0906   HCT 41.5 04/24/2021 0906   PLT 270 04/24/2021 0906   MCV 90 04/24/2021 0906   MCH 29.2 04/24/2021 0906   MCH 30.5 01/26/2011 1450   MCHC 32.5 04/24/2021 0906   MCHC 33.1 01/26/2011 1450   RDW 13.1 04/24/2021 0906   Iron Studies No results found for: "IRON", "TIBC", "FERRITIN", "IRONPCTSAT" Lipid Panel     Component Value Date/Time   CHOL 186 01/15/2022 0855   TRIG 78 01/15/2022 0855   HDL 70 01/15/2022 0855   CHOLHDL 3.4 11/15/2019 1124   LDLCALC 102 (H) 01/15/2022 0855   Hepatic Function Panel     Component Value Date/Time   PROT 7.6 11/05/2022 1449   ALBUMIN 4.9 11/05/2022 1449   AST 19 11/05/2022 1449   ALT 15 11/05/2022 1449   ALKPHOS 87 11/05/2022 1449   BILITOT 0.5 11/05/2022 1449      Component Value Date/Time   TSH 1.120 04/24/2021 0906   Nutritional Lab Results  Component Value Date   VD25OH 44.4  11/05/2022   VD25OH 48.5 05/14/2022   VD25OH 46.4 01/15/2022    Attestations:   Patient was in the office today and time spent on visit including pre-visit chart review and post-visit care/coordination of care and electronic medical record documentation was 40 minutes. 50% of the time was in face to face counseling of this patient's medical condition(s) and providing education on treatment options to include the first-line treatment of diet and lifestyle modification.   I, Special Randolm Idol, acting as a Stage manager for [employer], DO., have compiled all relevant documentation for today's office visit on behalf of Thomasene Lot, DO, while in the presence of [employer], DO.  I have reviewed the above documentation for accuracy and completeness, and I agree with the above. Amber Parrish, D.O.  The 21st Century Cures Act was signed into law in 2016 which includes the topic of electronic health records.  This provides immediate access to information in MyChart.  This includes consultation notes, operative notes, office notes, lab results and pathology reports.  If you have any questions about what you read please let us know at your next visit so we can discuss your concerns and take corrective action if need be.  We are right here with you.

## 2023-01-20 NOTE — Progress Notes (Unsigned)
NEUROLOGY FOLLOW UP OFFICE NOTE  Amber Parrish [id number]  Assessment/Plan:   Chronic headaches, dizziness - at this point, unclear etiology.  Regarding possibility of IIH, opening pressure was borderline elevated and she noted improvement for about a month after the LP but symptoms have returned to daily since then on acetazolamide.  Headaches are occipital but doubt cervicogenic as she doesn't have frequent neck pain and cervical spine X-ray unremarkable.      At this point, we will continue to treat for IIH.  She will remain on acetazolamide 500mg  twice daily. If no improvement in 2 weeks, she will contact me and we can increase dose to 750mg  twice daily.  If no improvement for another month or so, plan would be to discontinue acetazolamide and treat for migraines with CGRP inhibiror. Headache rescue:  Rizatriptan 10mg   Limit use of pain relievers to no more than 2 days out of week to prevent risk of rebound or medication-overuse headache. Tizanidine 2mg  at bedtime   Keep headache diary Follow up 6 months.     Subjective:  Amber Parrish is a 52 year old right-handed female who follows up for headaches.   UPDATE: Evaluated for idiopathic intracranial hypertension.  CT head from 08/21/2022 personally reviewed was normal.  Underwent lumbar puncture on 09/01/2022 which revealed opening pressure of 26 cm water and closing pressure of 17 cm water.  CSF labs (cell count, glucose, protein, culture and cytology) were normal.  She was started on acetazolamide ER 500mg  twice daily at that time.  She stopped the venlafaxine because she thought we were only going to try the acetazolamide.  Following the LP, headaches improved for about 6 weeks.  They started increasing after that.  However, she had ran out of acetazolamide in August and her pharmacy wouldn't approve it for another 2 weeks.  Headaches worsened.  Since restarting it, headaches have been a daily dull headache that becomes more  severe in the evening.  Not treating with OTC analgesics and rarely taking rizatriptan.  Sometimes has episodes of feeling severe pressure in the back of her head.  Occurs more often when tired or having been busy.  Wave feeling in back of head lasts a couple of minutes with residual pressure in head for 1 hour and occurred 4 to 5 times in last 3-4 weeks.  Still feels dizzy and notes whooshing in her head with these episodes.      Current NSAIDS/analgesics:  ibuprofen (rare) Current triptans:  rizatriptan 10mg  Current ergotamine:  none Current anti-emetic:  none Current muscle relaxants:  tizanidine 2mg   at bedtime (cannot tolerate taking three times daily) Current Antihypertensive medications:  none Current Antidepressant medications:  none Current Anticonvulsant medications: none Current anti-CGRP:  none Current Vitamins/Herbal/Supplements:  none Current Antihistamines/Decongestants:  none Other therapy:  none Other medications:  acetazolamide 500mg  twice daily Hormone/birth control:  none   Caffeine:  occasional coffee Diet:  Hydrates.  Watches her diet.  No soda.  [facility]. Exercise:  not routine Depression:  no; Anxiety: stable  Other pain:  no Sleep hygiene:  wakes up more often in the middle of the night. Maybe 6 hours of sleep.  No daytime sleepiness.     HISTORY:  She began having headaches around the summer 2022.   It is a unilateral pressure occipital headache (either side) that begins as 2-3/10 in the morning and gradually spreads to the forehead at a 6-7/10 intensity late in  the day.  Sometimes neck pain and radiates into both temples by end of day.  She has photosensitivity but denies nausea, vomiting, phonophobia, autonomic symptoms, dizziness, numbness or weakness.  They occur daily.  Bright light and intense smells are triggers.  Rest and hydration help relieve it.  MRI and MRV of brain with and without contrast in November 2022  were unremarkable.  Later that month, she started noticing blurred vision whenever concentrating/reading or using computer but denies visual obscurations.  When headaches are more severe, she may hear a whooshing in her head.  She tried treating with ibuprofen but since ineffective, she has not taken any analgesics.  Also with some neck pain.  X-ray of cervical spine was unremarkable   She had a formal eye exam in March 2024 which revealed no papilledema.     Her father passed away in early 2021-02-10 but no new stressors by the time headaches started.  She works as an Airline pilot.  Work and home life is the same.  No injuries or illness.       Past NSAIDS/analgesics:  ibuprofen Past abortive triptans:  none Past abortive ergotamine:  none Past muscle relaxants:  none Past anti-emetic:  none Past antihypertensive medications:  furosemide Past antidepressant medications:  Venlafaxine XR 75mg  daily, Sertraline, Wellbutrin Past anticonvulsant medications:  topiramate ER Past anti-CGRP:  none Past vitamins/Herbal/Supplements:  none Past antihistamines/decongestants:  Benadryl Other past therapies:  [facility] (Dr. Richardean Sale - effective)     Family history of headache:  not that she knows.   PAST MEDICAL HISTORY: Past Medical History:  Diagnosis Date   Bilateral hand swelling    Headache    Heartburn    Joint pain    Muscle pain    Other fatigue    Overweight    Shortness of breath on exertion     MEDICATIONS: Current Outpatient Medications on File Prior to Visit  Medication Sig Dispense Refill   acetaZOLAMIDE ER (DIAMOX) 500 MG capsule TAKE 1 CAPSULE BY MOUTH TWICE A DAY 180 capsule 1   metFORMIN (GLUCOPHAGE) 500 MG tablet 2 po with lunch and 1 with dinner daily 270 tablet 0   rizatriptan (MAXALT) 10 MG tablet TAKE 1 TABLET BY MOUTH AS NEEDED. REPEAT IN 2 HOURS IF NEEDED. MAX 2TABS/24HRS 18 tablet 5   tiZANidine (ZANAFLEX) 2 MG tablet Take 1 tablet (2 mg total) by mouth 3 (three) times  daily as needed. 270 tablet 1   Topiramate ER (TROKENDI XR) 50 MG CP24 1 po qd 90 capsule 0   venlafaxine XR (EFFEXOR-XR) 75 MG 24 hr capsule TAKE 1 CAPSULE BY MOUTH DAILY WITH BREAKFAST. 90 capsule 2   Vitamin D, Ergocalciferol, (DRISDOL) 1.25 MG (50000 UNIT) CAPS capsule Take 1 capsule (50,000 Units total) by mouth every 7 (seven) days. 4 capsule 0   No current facility-administered medications on file prior to visit.    ALLERGIES: Allergies  Allergen Reactions   Penicillins     FAMILY HISTORY: Family History  Problem Relation Age of Onset   Diabetes Mother    Hypertension Mother    Obesity Mother    Diabetes Father    Alcohol abuse Father    Arthritis Maternal Grandmother    Cancer Maternal Grandmother    Diabetes Maternal Grandmother    Hypertension Maternal Grandmother       Objective:  Blood pressure 124/78, pulse 73, SpO2 99%. General: No acute distress.  Patient appears well-groomed.      Shon Millet, DO  CC: Sharlot Gowda, MD

## 2023-01-22 ENCOUNTER — Other Ambulatory Visit: Payer: Self-pay | Admitting: Family Medicine

## 2023-01-22 ENCOUNTER — Ambulatory Visit: Payer: 59 | Admitting: Neurology

## 2023-01-22 ENCOUNTER — Encounter: Payer: Self-pay | Admitting: Neurology

## 2023-01-22 VITALS — BP 124/78 | HR 73 | Ht 65.0 in | Wt 240.0 lb

## 2023-01-22 DIAGNOSIS — Z1211 Encounter for screening for malignant neoplasm of colon: Secondary | ICD-10-CM

## 2023-01-22 DIAGNOSIS — Z1212 Encounter for screening for malignant neoplasm of rectum: Secondary | ICD-10-CM

## 2023-01-22 DIAGNOSIS — R519 Headache, unspecified: Secondary | ICD-10-CM

## 2023-02-04 ENCOUNTER — Ambulatory Visit (INDEPENDENT_AMBULATORY_CARE_PROVIDER_SITE_OTHER): Payer: 59 | Admitting: Family Medicine

## 2023-02-11 ENCOUNTER — Ambulatory Visit (INDEPENDENT_AMBULATORY_CARE_PROVIDER_SITE_OTHER): Payer: 59 | Admitting: Family Medicine

## 2023-02-13 ENCOUNTER — Other Ambulatory Visit (INDEPENDENT_AMBULATORY_CARE_PROVIDER_SITE_OTHER): Payer: Self-pay | Admitting: Family Medicine

## 2023-02-13 DIAGNOSIS — G43809 Other migraine, not intractable, without status migrainosus: Secondary | ICD-10-CM

## 2023-02-13 DIAGNOSIS — R7303 Prediabetes: Secondary | ICD-10-CM

## 2023-02-25 ENCOUNTER — Encounter (INDEPENDENT_AMBULATORY_CARE_PROVIDER_SITE_OTHER): Payer: Self-pay | Admitting: Family Medicine

## 2023-02-25 ENCOUNTER — Ambulatory Visit (INDEPENDENT_AMBULATORY_CARE_PROVIDER_SITE_OTHER): Payer: 59 | Admitting: Family Medicine

## 2023-02-25 VITALS — BP 116/74 | HR 74 | Temp 98.4°F | Ht 64.0 in | Wt 232.0 lb

## 2023-02-25 DIAGNOSIS — E559 Vitamin D deficiency, unspecified: Secondary | ICD-10-CM

## 2023-02-25 DIAGNOSIS — J3089 Other allergic rhinitis: Secondary | ICD-10-CM

## 2023-02-25 DIAGNOSIS — Z6839 Body mass index (BMI) 39.0-39.9, adult: Secondary | ICD-10-CM

## 2023-02-25 DIAGNOSIS — R7303 Prediabetes: Secondary | ICD-10-CM

## 2023-02-25 DIAGNOSIS — F39 Unspecified mood [affective] disorder: Secondary | ICD-10-CM

## 2023-02-25 DIAGNOSIS — Z6838 Body mass index (BMI) 38.0-38.9, adult: Secondary | ICD-10-CM

## 2023-02-25 MED ORDER — LEVOCETIRIZINE DIHYDROCHLORIDE 5 MG PO TABS
5.0000 mg | ORAL_TABLET | Freq: Every evening | ORAL | 0 refills | Status: DC
Start: 2023-02-25 — End: 2023-06-30

## 2023-02-25 MED ORDER — METFORMIN HCL 500 MG PO TABS
ORAL_TABLET | ORAL | 0 refills | Status: DC
Start: 2023-02-25 — End: 2023-06-30

## 2023-02-25 MED ORDER — MONTELUKAST SODIUM 10 MG PO TABS
10.0000 mg | ORAL_TABLET | Freq: Every day | ORAL | 0 refills | Status: DC
Start: 2023-02-25 — End: 2023-06-30

## 2023-02-25 MED ORDER — VITAMIN D (ERGOCALCIFEROL) 1.25 MG (50000 UNIT) PO CAPS: 50000.0000 [IU] | ORAL_CAPSULE | ORAL | 0 refills | Status: DC

## 2023-02-25 NOTE — Progress Notes (Signed)
Amber Parrish, D.O.  ABFM, ABOM Specializing in Clinical Bariatric Medicine  Office located at: [address] [place], [place]  [address]   Assessment and Plan:   Orders Placed This Encounter  Procedures   Hemoglobin A1c   Insulin, random   VITAMIN D 25 Hydroxy (Vit-D Deficiency, Fractures)    FOR THE DISEASE OF OBESITY:  Since last office visit on 01/05/23 patient's muscle mass has increased by 0.4 lb. Fat mass has decreased by 0.4 lb. Total body water has decreased by 0.4 lb.  Counseling done on how various foods will affect these numbers and how to maximize success  Total lbs lost to date: 14 lbs  Total weight loss percentage to date: 5.69%   Recommended Dietary Goals [name] is currently in the action stage of change. As such, her goal is to continue weight management plan.  She has agreed to: continue current plan  Behavioral Intervention We discussed the following Behavioral Modification Strategies today: work on managing stress, creating time for self-care.  Additional resources provided today: List of Mental Health Providers  Evidence-based interventions for health behavior change were utilized today including the discussion of self monitoring techniques, problem-solving barriers and SMART goal setting techniques.   Regarding patient's less desirable eating habits and patterns, we employed the technique of small changes.   Pt will specifically work on: exploring the book Atomic Habits to help with motivation and obtain a counselor for regular behavioral therapy sessions  Recommended Physical Activity Goals [name] has been advised to work up to 150 minutes of moderate intensity aerobic activity a week and strengthening exercises 2-3 times per week for cardiovascular health, weight loss maintenance and preservation of muscle mass.   She has agreed to :  continue to gradually increase the amount and intensity of exercise   Pharmacotherapy We discussed  various medication options to help [name] with her weight loss efforts and we both agreed to : continue with nutritional and behavioral strategies and continue current anti-obesity medication regimen   FOR ASSOCIATED CONDITIONS ADDRESSED TODAY:  Pre-diabetes Assessment & Plan: Most recent Hemoglobin A1c and fasting insulin of 6.0 and 13.1 respectively on 11/05/22. Condition treated with Metformin 500 mg 2 po with lunch and 1 po with dinner. Pt tolerating medication well, denies any GI upset. No c/o hunger and cravings today.   Continue with reduced calorie nutritional plan (RNCP). Will recheck labs today.   Orders: -     metFORMIN HCl; 2 po with lunch and 1 with dinner daily  Dispense: 270 tablet; Refill: 0 -     Hemoglobin A1c -     Insulin, random   Vitamin D deficiency Assessment & Plan: Most recent vit D of 44.4 on 11/05/22. Currently on ERGO 50,000 units once every seven days.   Continue with weight loss efforts and rx high dose vitamin D. Will recheck labs today.  Orders: -     Vitamin D (Ergocalciferol); Take 1 capsule (50,000 Units total) by mouth every 7 (seven) days.  Dispense: 4 capsule; Refill: 0 -     VITAMIN D 25 Hydroxy (Vit-D Deficiency, Fractures)   Mood disorder (HCC), with emotional eating Assessment & Plan: No mood medication currently.  Was on Effexor in the past.  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  She is tearful in office at times and admits  to having symptoms of depression.  NO SI.  Has good support system in husband and family/ friends.  She denies need for medications at this time but is open to further therapy for this condition  Behavior modification techniques were discussed today to help enhance motivation and deal with stress including obtaining counselor (list of mental health resources provided), reading self-help  books (e.g Atomic Habits). Reminded patient of the importance of following their prudent nutrition plan and how food can affect mood as well to support emotional wellbeing.    Environmental and seasonal allergies Assessment & Plan: [name] has seasonal allergies that have been more bothersome with the change of weather/ season and the falling of leaves etc.  Reports having symptoms of head and nasal congestion as well as rhinorrhea. Has been taking Benadryl only with only some relief in symptoms.   Preventative strategies as first line for management discussed.  I encouraged use sterile saline rinses such as [employer] Med or AYR sinus rinses to be done once- twice daily and after any prolonged exposure to the environment or allergen.  The risks and benefits of medications were discussed with Marguerita, including alternative treatment options and all questions were answered.   Patient encouraged to further educate self about medication prior to starting.( Med Package insert)   After discussion, advised to Start Levocetirizine and Montelukast.  Continue with low inflammatory meal plan.   Orders: -     Levocetirizine Dihydrochloride; Take 1 tablet (5 mg total) by mouth every evening.  Dispense: 90 tablet; Refill: 0 -     Montelukast Sodium; Take 1 tablet (10 mg total) by mouth at bedtime.  Dispense: 90 tablet; Refill: 0   BMI 38.0-38.9,adult-current BMI 39.8 Morbid obesity (HCC)-start bmi 42.23/start 04/24/21 Assessment & Plan: See treatment plan for obesity.    FOLLOW UP: Return in about 3 weeks (around 03/18/2023). She was informed of the importance of frequent follow up visits to maximize her success with intensive lifestyle modifications for her multiple health conditions.  Fransico Him is aware that we will review all of her lab results at our next visit.  She is aware that if anything is critical/ life threatening with the results, we will be contacting her via MyChart prior to the office  visit to discuss management.     Subjective:   Chief complaint: Obesity [name] is here to discuss her progress with her obesity treatment plan. She  is keeping a food journal and adhering to recommended goals of 1550-1650 calories and 110+ protein and states she is following her eating plan approximately 80% of the time. She states she was playing volleyball 120 minutes 1 day a week and walking 20 minutes 3 days a week prior to getting sick.  Interval History:  EMILYANNE PRIMAS is here for a follow up office visit. Since last OV,  Mrs.Harshbarger's weight has not changed. Pt had the common cold and endorses not wanting to eat in the last few weeks. Reports that she is recovering well.  Barriers identified: lack of time for self-care and caregiver stress (pt takes care of mother) .   Pharmacotherapy for weight loss: She is currently taking  Metformin 500 mg 2 po with lunch and 1 po with dinner daily .   Review of Systems:  Pertinent positives were addressed with patient today.  Reviewed by clinician on day of visit: allergies, medications, problem list, medical history, surgical history, family history, social history, and previous encounter notes.  Weight Summary and Biometrics  Weight Lost Since Last Visit: 0lb  Weight Gained Since Last Visit: 0lb    Vitals Temp: 98.4 F (36.9 C) BP: 116/74 Pulse Rate: 74 SpO2: 99 %   Anthropometric Measurements Height: 5\' 4"  (1.626 m) Weight: 232 lb (105.2 kg) BMI (Calculated): 39.8 Weight at Last Visit: 232lb Weight Lost Since Last Visit: 0lb Weight Gained Since Last Visit: 0lb Starting Weight: 246 lb Total Weight Loss (lbs): 14 lb (6.35 kg) Peak Weight: 258 lb   Body Composition  Body Fat %: 44.8 % Fat Mass (lbs): 104.2 lbs Muscle Mass (lbs): 122 lbs Total Body Water (lbs): 86.2 lbs Visceral Fat Rating : 13   Other Clinical Data Fasting: no Labs: no Today's Visit #: 23 Starting Date: 04/24/21   Objective:   PHYSICAL  EXAM: Blood pressure 116/74, pulse 74, temperature 98.4 F (36.9 C), height 5\' 4"  (1.626 m), weight 232 lb (105.2 kg), SpO2 99%. Body mass index is 39.82 kg/m.  General: she is overweight, cooperative and in no acute distress. PSYCH: Has norma affect and thought process. Tearful with depressed mood  HEENT: EOMI, sclerae are anicteric. Lungs: Normal breathing effort, no conversational dyspnea. Extremities: Moves * 4 Neurologic: A and O * 3, good insight  DIAGNOSTIC DATA REVIEWED: BMET    Component Value Date/Time   NA 140 11/05/2022 1449   K 4.8 11/05/2022 1449   CL 105 11/05/2022 1449   CO2 21 11/05/2022 1449   GLUCOSE 95 11/05/2022 1449   GLUCOSE 82 01/26/2011 1450   BUN 20 11/05/2022 1449   CREATININE 0.80 11/05/2022 1449   CREATININE 0.61 01/26/2011 1450   CALCIUM 10.1 11/05/2022 1449   GFRNONAA 106 07/03/2019 1156   GFRAA 122 07/03/2019 1156   Lab Results  Component Value Date   HGBA1C 6.0 (H) 11/05/2022   HGBA1C 5.7 (H) 07/03/2019   Lab Results  Component Value Date   INSULIN 13.1 11/05/2022   INSULIN 16.7 07/03/2019   Lab Results  Component Value Date   TSH 1.120 04/24/2021   CBC    Component Value Date/Time   WBC 4.4 04/24/2021 0906   WBC 5.8 01/26/2011 1450   RBC 4.63 04/24/2021 0906   RBC 4.17 01/26/2011 1450   HGB 13.5 04/24/2021 0906   HCT 41.5 04/24/2021 0906   PLT 270 04/24/2021 0906   MCV 90 04/24/2021 0906   MCH 29.2 04/24/2021 0906   MCH 30.5 01/26/2011 1450   MCHC 32.5 04/24/2021 0906   MCHC 33.1 01/26/2011 1450   RDW 13.1 04/24/2021 0906   Iron Studies No results found for: "IRON", "TIBC", "FERRITIN", "IRONPCTSAT" Lipid Panel     Component Value Date/Time   CHOL 186 01/15/2022 0855   TRIG 78 01/15/2022 0855   HDL 70 01/15/2022 0855   CHOLHDL 3.4 11/15/2019 1124   LDLCALC 102 (H) 01/15/2022 0855   Hepatic Function Panel     Component Value Date/Time   PROT 7.6 11/05/2022 1449   ALBUMIN 4.9 11/05/2022 1449   AST 19  11/05/2022 1449   ALT 15 11/05/2022 1449   ALKPHOS 87 11/05/2022 1449   BILITOT 0.5 11/05/2022 1449      Component Value Date/Time   TSH 1.120 04/24/2021 0906   Nutritional Lab Results  Component Value Date   VD25OH 44.4 11/05/2022   VD25OH 48.5 05/14/2022   VD25OH 46.4 01/15/2022    Attestations:   Reviewed by clinician on day of visit: allergies, medications, problem list, medical history, surgical history, family history, social history, and previous encounter notes pertinent  to patient's obesity diagnosis. I have spent 40 minutes in the care of the patient today including: preparing to see patient (e.g. review and interpretation of tests, old notes ), obtaining and/or reviewing separately obtained history, performing a medically appropriate examination or evaluation, counseling and educating the patient, ordering medications, test or procedures, documenting clinical information in the electronic or other health care record, and independently interpreting results and communicating results to the patient, family, or caregiver.   I, Special Randolm Idol, acting as a Stage manager for [employer], DO., have compiled all relevant documentation for today's office visit on behalf of Thomasene Lot, DO, while in the presence of [employer], DO.  I have reviewed the above documentation for accuracy and completeness, and I agree with the above. Amber Parrish, D.O.  The 21st Century Cures Act was signed into law in 2016 which includes the topic of electronic health records.  This provides immediate access to information in MyChart.  This includes consultation notes, operative notes, office notes, lab results and pathology reports.  If you have any questions about what you read please let us know at your next visit so we can discuss your concerns and take corrective action if need be.  We are right here with you.

## 2023-02-26 LAB — VITAMIN D 25 HYDROXY (VIT D DEFICIENCY, FRACTURES): Vit D, 25-Hydroxy: 40.3 ng/mL (ref 30.0–100.0)

## 2023-02-26 LAB — INSULIN, RANDOM: INSULIN: 12.8 u[IU]/mL (ref 2.6–24.9)

## 2023-02-26 LAB — HEMOGLOBIN A1C
Est. average glucose Bld gHb Est-mCnc: 120 mg/dL
Hgb A1c MFr Bld: 5.8 % — ABNORMAL HIGH (ref 4.8–5.6)

## 2023-03-17 ENCOUNTER — Other Ambulatory Visit (INDEPENDENT_AMBULATORY_CARE_PROVIDER_SITE_OTHER): Payer: Self-pay | Admitting: Family Medicine

## 2023-03-17 DIAGNOSIS — E559 Vitamin D deficiency, unspecified: Secondary | ICD-10-CM

## 2023-03-24 ENCOUNTER — Other Ambulatory Visit: Payer: Self-pay | Admitting: Neurology

## 2023-03-25 ENCOUNTER — Ambulatory Visit (INDEPENDENT_AMBULATORY_CARE_PROVIDER_SITE_OTHER): Payer: 59 | Admitting: Family Medicine

## 2023-03-25 ENCOUNTER — Encounter (INDEPENDENT_AMBULATORY_CARE_PROVIDER_SITE_OTHER): Payer: Self-pay | Admitting: Family Medicine

## 2023-03-25 VITALS — BP 124/75 | HR 74 | Temp 98.0°F | Ht 64.0 in | Wt 236.0 lb

## 2023-03-25 DIAGNOSIS — E559 Vitamin D deficiency, unspecified: Secondary | ICD-10-CM | POA: Diagnosis not present

## 2023-03-25 DIAGNOSIS — R7303 Prediabetes: Secondary | ICD-10-CM

## 2023-03-25 DIAGNOSIS — Z6838 Body mass index (BMI) 38.0-38.9, adult: Secondary | ICD-10-CM

## 2023-03-25 DIAGNOSIS — Z6841 Body Mass Index (BMI) 40.0 and over, adult: Secondary | ICD-10-CM | POA: Diagnosis not present

## 2023-03-25 MED ORDER — VITAMIN D (ERGOCALCIFEROL) 1.25 MG (50000 UNIT) PO CAPS
ORAL_CAPSULE | ORAL | 1 refills | Status: DC
Start: 2023-03-25 — End: 2023-05-13

## 2023-03-25 NOTE — Telephone Encounter (Signed)
error 

## 2023-03-25 NOTE — Progress Notes (Signed)
Carlye Grippe, D.O.  ABFM, ABOM Specializing in Clinical Bariatric Medicine  Office located at: [address] [place], [place]  [address]   Assessment and Plan:   FOR THE DISEASE OF OBESITY: BMI 38.0-38.9,adult-current BMI 40.49 Morbid obesity (HCC)-start bmi 42.23/start 04/24/21 Assessment & Plan: Since last office visit on 02/24/41 patient's  muscle mass has increased by 0.2 lb. Fat mass has increased by 3.6 lb. Total body water has increased by 0.2. lb.  Counseling done on how various foods will affect these numbers and how to maximize success  Total lbs lost to date: 10 lbs  Total weight loss percentage to date: 4.07%    Recommended Dietary Goals Amber Parrish is currently in the action stage of change. As such, her goal is to continue weight management plan.  She has agreed to: continue current plan   Behavioral Intervention We discussed the following today: making herself a priority, dinner being the most important meal, incorporating whole foods, increasing lean protein intake.  Additional resources provided today: handout on CAT 3 meal plan and handout on food journaling log  Evidence-based interventions for health behavior change were utilized today including the discussion of self monitoring techniques, problem-solving barriers and SMART goal setting techniques.   Regarding patient's less desirable eating habits and patterns, we employed the technique of small changes.   Pt will specifically work on: switching dinner meal to lunch, bring in journaling log, and reading Atomic Habits.    Recommended Physical Activity Goals Amber Parrish has been advised to work up to 150 minutes of moderate intensity aerobic activity a week and strengthening exercises 2-3 times per week for cardiovascular health, weight loss maintenance and preservation of muscle mass.   She has agreed to : Continue current level of physical activity    Pharmacotherapy We both agreed to : continue  current anti-obesity medication regimen   FOR ASSOCIATED CONDITIONS ADDRESSED TODAY:  Pre-diabetes Assessment & Plan: Lab Results  Component Value Date   HGBA1C 5.8 (H) 02/25/2023   HGBA1C 6.0 (H) 11/05/2022   HGBA1C 5.5 05/14/2022   INSULIN 12.8 02/25/2023   INSULIN 13.1 11/05/2022   INSULIN 15.0 01/15/2022    She is prescribed Metformin 500 mg tid for pre-diabetes - she has been taking 2 tablets at lunch time and sometimes misses the dose at dinner. Does not having snacking tendencies after dinner. Hemoglobin A1c has improved from 6.0 to 5.8. Fasting insulin levels have slightly improved from 13.1 to 12.8.   Reminded pt to take Metformin on a consistent basis. Pt declines to switch to Metformin XR. Continue to decrease simple carbs/ sugars; increase fiber and proteins -> follow her meal plan.     Vitamin D deficiency Assessment & Plan: Lab Results  Component Value Date   VD25OH 40.3 02/25/2023   VD25OH 44.4 11/05/2022   VD25OH 48.5 05/14/2022   Vitamin D levels are not within the recommended limits of 50 to 70. She admits to consistently taking her prescription high dose vitamin D. Will double ERGO to twice weekly.  Continue with weight loss efforts.   Orders: -     Vitamin D (Ergocalciferol); 1 po q Mon and 1 po q Thurs  Dispense: 8 capsule; Refill: 1   Follow up:   Return 05/13/23. She was informed of the importance of frequent follow up visits to maximize her success with intensive lifestyle modifications for her multiple health conditions.  Subjective:   Chief complaint: Obesity Amber Parrish is here to discuss her progress with her  obesity treatment plan. She is keeping a food journal and adhering to recommended goals of 1550-1650 calories and 110+ protein and states she is following her eating plan approximately 70% of the time. She states she is not exercising.  Interval History:  Amber Parrish is here for a follow up office visit. Since last OV, Amber Parrish is up 4  lbs. She endorses doing well with breakfast and lunch Mon-Friday. Struggles with dinner and weekend meals. Thanksgiving wise, she endorses trying to be protein focused. Did not read Atomic habits or obtain a counselor - again encouraged to do so.   Pharmacotherapy for weight loss: She is prescribed Metformin 500 mg tid for pre-diabetes - she has been taking 2 tablets at lunch time and sometimes misses the dinner tablet.   Review of Systems:  Pertinent positives were addressed with patient today.  Reviewed by clinician on day of visit: allergies, medications, problem list, medical history, surgical history, family history, social history, and previous encounter notes.  Weight Summary and Biometrics   Weight Lost Since Last Visit: 0lb  Weight Gained Since Last Visit: 4lb   Vitals Temp: 98 F (36.7 C) BP: 124/75 Pulse Rate: 74 SpO2: 99 %   Anthropometric Measurements Height: 5\' 4"  (1.626 m) Weight: 236 lb (107 kg) BMI (Calculated): 40.49 Weight at Last Visit: 232lb Weight Lost Since Last Visit: 0lb Weight Gained Since Last Visit: 4lb Starting Weight: 246 lb Total Weight Loss (lbs): 10 lb (4.536 kg) Peak Weight: 258 lb   Body Composition  Body Fat %: 45.6 % Fat Mass (lbs): 107.8 lbs Muscle Mass (lbs): 122.2 lbs Total Body Water (lbs): 86.4 lbs Visceral Fat Rating : 14   Other Clinical Data Fasting: no Labs: no Today's Visit #: 24 Starting Date: 04/24/21   Objective:   PHYSICAL EXAM: Blood pressure 124/75, pulse 74, temperature 98 F (36.7 C), height 5\' 4"  (1.626 m), weight 236 lb (107 kg), SpO2 99%. Body mass index is 40.51 kg/m.  General: she is overweight, cooperative and in no acute distress. PSYCH: Has normal mood, affect and thought process.   HEENT: EOMI, sclerae are anicteric. Lungs: Normal breathing effort, no conversational dyspnea. Extremities: Moves * 4 Neurologic: A and O * 3, good insight  DIAGNOSTIC DATA REVIEWED: BMET    Component Value  Date/Time   NA 140 11/05/2022 1449   K 4.8 11/05/2022 1449   CL 105 11/05/2022 1449   CO2 21 11/05/2022 1449   GLUCOSE 95 11/05/2022 1449   GLUCOSE 82 01/26/2011 1450   BUN 20 11/05/2022 1449   CREATININE 0.80 11/05/2022 1449   CREATININE 0.61 01/26/2011 1450   CALCIUM 10.1 11/05/2022 1449   GFRNONAA 106 07/03/2019 1156   GFRAA 122 07/03/2019 1156   Lab Results  Component Value Date   HGBA1C 5.8 (H) 02/25/2023   HGBA1C 5.7 (H) 07/03/2019   Lab Results  Component Value Date   INSULIN 12.8 02/25/2023   INSULIN 16.7 07/03/2019   Lab Results  Component Value Date   TSH 1.120 04/24/2021   CBC    Component Value Date/Time   WBC 4.4 04/24/2021 0906   WBC 5.8 01/26/2011 1450   RBC 4.63 04/24/2021 0906   RBC 4.17 01/26/2011 1450   HGB 13.5 04/24/2021 0906   HCT 41.5 04/24/2021 0906   PLT 270 04/24/2021 0906   MCV 90 04/24/2021 0906   MCH 29.2 04/24/2021 0906   MCH 30.5 01/26/2011 1450   MCHC 32.5 04/24/2021 0906   MCHC 33.1 01/26/2011 1450  RDW 13.1 04/24/2021 0906   Iron Studies No results found for: "IRON", "TIBC", "FERRITIN", "IRONPCTSAT" Lipid Panel     Component Value Date/Time   CHOL 186 01/15/2022 0855   TRIG 78 01/15/2022 0855   HDL 70 01/15/2022 0855   CHOLHDL 3.4 11/15/2019 1124   LDLCALC 102 (H) 01/15/2022 0855   Hepatic Function Panel     Component Value Date/Time   PROT 7.6 11/05/2022 1449   ALBUMIN 4.9 11/05/2022 1449   AST 19 11/05/2022 1449   ALT 15 11/05/2022 1449   ALKPHOS 87 11/05/2022 1449   BILITOT 0.5 11/05/2022 1449      Component Value Date/Time   TSH 1.120 04/24/2021 0906   Nutritional Lab Results  Component Value Date   VD25OH 40.3 02/25/2023   VD25OH 44.4 11/05/2022   VD25OH 48.5 05/14/2022    Attestations:   I, Special Puri, acting as a Stage manager for [employer], DO., have compiled all relevant documentation for today's office visit on behalf of Thomasene Lot, DO, while in the presence of [employer],  DO.  Reviewed by clinician on day of visit: allergies, medications, problem list, medical history, surgical history, family history, social history, and previous encounter notes pertinent to patient's obesity diagnosis. I have spent 41 minutes in the care of the patient today including: preparing to see patient (e.g. review and interpretation of tests, old notes ), obtaining and/or reviewing separately obtained history, performing a medically appropriate examination or evaluation, counseling and educating the patient, ordering medications, test or procedures, documenting clinical information in the electronic or other health care record, and independently interpreting results and communicating results to the patient, family, or caregiver   I have reviewed the above documentation for accuracy and completeness, and I agree with the above. Carlye Grippe, D.O.  The 21st Century Cures Act was signed into law in 2016 which includes the topic of electronic health records.  This provides immediate access to information in MyChart.  This includes consultation notes, operative notes, office notes, lab results and pathology reports.  If you have any questions about what you read please let us know at your next visit so we can discuss your concerns and take corrective action if need be.  We are right here with you.

## 2023-03-25 NOTE — Progress Notes (Signed)
NEUROLOGY FOLLOW UP OFFICE NOTE  Amber Parrish [id number]  Assessment/Plan:   Chronic headaches, dizziness - at this point, unclear etiology.  Regarding possibility of IIH, opening pressure was borderline elevated and she noted improvement for about a month after the LP but symptoms have returned to daily since then on acetazolamide.  Headaches are occipital, so may consider cervicogenic/occipital neuralgia component.      Check CTA head and neck to evaluate for any intracranial and extracranial arterial etiology. Will increase acetazolamide to 750mg  twice daily.  If no improvement in 4 weeks, I would discontinue and switch to nortriptyline 10mg  at bedtime. Increase tizanidine to 4mg  at bedtime Follow up 3 months.  Total time spent in chart and face to face with patient:  30 minutes    Subjective:  Amber Parrish is a 52 year old right-handed female who follows up for headaches.   UPDATE: Last seen on 10/4.  At that time, headaches had been a daily dull headache that becomes more severe in the evening. Not treating with OTC analgesics and rarely taking rizatriptan. Sometimes with episodes of feeling severe pressure in the back of her head. Occurs more often when tired or having been busy. Wave feeling in back of head lasts a couple of minutes with residual pressure in head for 1 hour and associated with dizziness and pulsatile tinnitus.  She was advised to continue acetazolamide 500mg  twice daily.  No change.  Headaches mostly posterior.  Better when laying supine.  Still with occasional pulsatile tinnitus but no visual obscurations.  When headaches come on, she feels that she physically cannot open her eyes.   Current NSAIDS/analgesics:  ibuprofen (rare) Current triptans:  rizatriptan 10mg  Current ergotamine:  none Current anti-emetic:  none Current muscle relaxants:  tizanidine 2mg   at bedtime (cannot tolerate taking three times daily) Current Antihypertensive medications:   none Current Antidepressant medications:  none Current Anticonvulsant medications: none Current anti-CGRP:  none Current Vitamins/Herbal/Supplements:  none Current Antihistamines/Decongestants:  none Other therapy:  none Other medications:  acetazolamide 500mg  twice daily Hormone/birth control:  none   Caffeine:  occasional coffee Diet:  Hydrates.  Watches her diet.  No soda.  [facility] to [facility]. Exercise:  not routine Depression:  no; Anxiety: stable  Other pain:  no Sleep hygiene:  wakes up more often in the middle of the night. Maybe 6 hours of sleep.  No daytime sleepiness.     HISTORY:  She began having headaches around the summer 2022.   It is a unilateral pressure occipital headache (either side) that begins as 2-3/10 in the morning and gradually spreads to the forehead at a 6-7/10 intensity late in the day.  Sometimes neck pain and radiates into both temples by end of day.  She has photosensitivity but denies nausea, vomiting, phonophobia, autonomic symptoms, dizziness, numbness or weakness.  They occur daily.  Bright light and intense smells are triggers.  Rest and hydration help relieve it.  MRI and MRV of brain with and without contrast in November 2022 were unremarkable.  Later that month, she started noticing blurred vision whenever concentrating/reading or using computer but denies visual obscurations.  When headaches are more severe, she may hear a whooshing in her head.  She tried treating with ibuprofen but since ineffective, she has not taken any analgesics.  Also with some neck pain.  X-ray of cervical spine was unremarkable   She had a formal eye exam in March 2024 which  revealed no papilledema.  Evaluated for idiopathic intracranial hypertension.  CT head from 08/21/2022 was normal.  Underwent lumbar puncture on 09/01/2022 which revealed opening pressure of 26 cm water and closing pressure of 17 cm water.  CSF labs (cell count, glucose, protein,  culture and cytology) were normal.  She was started on acetazolamide ER 500mg  twice daily at that time.  She stopped the venlafaxine because she thought we were only going to try the acetazolamide.     Past NSAIDS/analgesics:  ibuprofen Past abortive triptans:  none Past abortive ergotamine:  none Past muscle relaxants:  none Past anti-emetic:  none Past antihypertensive medications:  furosemide Past antidepressant medications:  Venlafaxine XR 75mg  daily, Sertraline, Wellbutrin Past anticonvulsant medications:  topiramate ER Past anti-CGRP:  none Past vitamins/Herbal/Supplements:  none Past antihistamines/decongestants:  Benadryl Other past therapies:  [facility] (Dr. Richardean Sale - effective)     Family history of headache:  not that she knows.   PAST MEDICAL HISTORY: Past Medical History:  Diagnosis Date   Bilateral hand swelling    Headache    Heartburn    Joint pain    Muscle pain    Other fatigue    Overweight    Shortness of breath on exertion     MEDICATIONS: Current Outpatient Medications on File Prior to Visit  Medication Sig Dispense Refill   acetaZOLAMIDE ER (DIAMOX) 500 MG capsule TAKE 1 CAPSULE BY MOUTH TWICE A DAY 180 capsule 1   levocetirizine (XYZAL) 5 MG tablet Take 1 tablet (5 mg total) by mouth every evening. 90 tablet 0   metFORMIN (GLUCOPHAGE) 500 MG tablet 2 po with lunch and 1 with dinner daily 270 tablet 0   montelukast (SINGULAIR) 10 MG tablet Take 1 tablet (10 mg total) by mouth at bedtime. 90 tablet 0   rizatriptan (MAXALT) 10 MG tablet TAKE 1 TABLET BY MOUTH AS NEEDED. REPEAT IN 2 HOURS IF NEEDED. MAX 2TABS/24HRS 18 tablet 5   tiZANidine (ZANAFLEX) 2 MG tablet Take 1 tablet (2 mg total) by mouth 3 (three) times daily as needed. 270 tablet 1   Vitamin D, Ergocalciferol, (DRISDOL) 1.25 MG (50000 UNIT) CAPS capsule Take 1 capsule (50,000 Units total) by mouth every 7 (seven) days. 4 capsule 0   No current facility-administered medications on file prior  to visit.    ALLERGIES: Allergies  Allergen Reactions   Penicillins     FAMILY HISTORY: Family History  Problem Relation Age of Onset   Diabetes Mother    Hypertension Mother    Obesity Mother    Diabetes Father    Alcohol abuse Father    Arthritis Maternal Grandmother    Cancer Maternal Grandmother    Diabetes Maternal Grandmother    Hypertension Maternal Grandmother       Objective:  Blood pressure 133/68, pulse 70, resp. rate 20, weight 229 lb (103.9 kg), SpO2 99%. General: No acute distress.  Patient appears well-groomed.   Head:  Normocephalic/atraumatic, bilateral suboccipital tenderness Neck:  Supple.  No paraspinal tenderness.  Full range of motion. Heart:  Regular rate and rhythm. Neuro:  Alert and oriented.  Speech fluent and not dysarthric.  Language intact.  CN II-XII intact.  Bulk and tone normal.  Muscle strength 5/5 throughout.  Deep tendon reflexes 2+ throughout.  Gait normal.  Romberg negative.    Shon Millet, DO  CC: Sharlot Gowda, MD

## 2023-03-26 ENCOUNTER — Encounter: Payer: Self-pay | Admitting: Neurology

## 2023-03-26 ENCOUNTER — Ambulatory Visit: Payer: 59 | Admitting: Neurology

## 2023-03-26 VITALS — BP 133/68 | HR 70 | Resp 20 | Wt 229.0 lb

## 2023-03-26 DIAGNOSIS — R519 Headache, unspecified: Secondary | ICD-10-CM

## 2023-03-26 DIAGNOSIS — M542 Cervicalgia: Secondary | ICD-10-CM | POA: Diagnosis not present

## 2023-03-26 MED ORDER — ACETAZOLAMIDE ER 500 MG PO CP12: 1000.0000 mg | ORAL_CAPSULE | Freq: Two times a day (BID) | ORAL | 5 refills | Status: DC

## 2023-03-26 NOTE — Patient Instructions (Signed)
Increase acetazolamide to 2 pills twice daily.  IF NO IMPROVEMENT IN 4 WEEKS, CONTACT ME AND WE WILL SWITCH MEDICATION Check CTA head and neck Increase tizanidine to 2 pills at bedtime Follow up 3 months.

## 2023-04-01 LAB — COLOGUARD: COLOGUARD: NEGATIVE

## 2023-04-12 ENCOUNTER — Other Ambulatory Visit: Payer: Self-pay | Admitting: Neurology

## 2023-04-12 ENCOUNTER — Ambulatory Visit
Admission: RE | Admit: 2023-04-12 | Discharge: 2023-04-12 | Disposition: A | Discharge status: Home / Self Care | Payer: 59 | Source: Ambulatory Visit | Attending: Neurology | Admitting: Neurology

## 2023-04-12 ENCOUNTER — Ambulatory Visit
Admission: RE | Admit: 2023-04-12 | Discharge: 2023-04-12 | Disposition: A | Discharge status: Home / Self Care | Payer: 59 | Source: Ambulatory Visit | Attending: Neurology

## 2023-04-12 DIAGNOSIS — M542 Cervicalgia: Secondary | ICD-10-CM

## 2023-04-12 DIAGNOSIS — R519 Headache, unspecified: Secondary | ICD-10-CM

## 2023-04-12 MED ORDER — IOPAMIDOL (ISOVUE-370) INJECTION 76%
100.0000 mL | Freq: Once | INTRAVENOUS | Status: AC | PRN
  Administered 2023-04-12: 65 mL via INTRAVENOUS

## 2023-04-14 ENCOUNTER — Other Ambulatory Visit: Payer: Self-pay | Admitting: Neurology

## 2023-04-16 ENCOUNTER — Other Ambulatory Visit (INDEPENDENT_AMBULATORY_CARE_PROVIDER_SITE_OTHER): Payer: Self-pay | Admitting: Family Medicine

## 2023-04-16 DIAGNOSIS — E559 Vitamin D deficiency, unspecified: Secondary | ICD-10-CM

## 2023-05-13 ENCOUNTER — Other Ambulatory Visit (INDEPENDENT_AMBULATORY_CARE_PROVIDER_SITE_OTHER): Payer: Self-pay | Admitting: Family Medicine

## 2023-05-13 ENCOUNTER — Encounter (INDEPENDENT_AMBULATORY_CARE_PROVIDER_SITE_OTHER): Payer: Self-pay | Admitting: Family Medicine

## 2023-05-13 ENCOUNTER — Ambulatory Visit (INDEPENDENT_AMBULATORY_CARE_PROVIDER_SITE_OTHER): Payer: 59 | Admitting: Family Medicine

## 2023-05-13 VITALS — BP 128/78 | HR 71 | Temp 97.7°F | Ht 64.0 in | Wt 233.0 lb

## 2023-05-13 DIAGNOSIS — R7303 Prediabetes: Secondary | ICD-10-CM

## 2023-05-13 DIAGNOSIS — Z6838 Body mass index (BMI) 38.0-38.9, adult: Secondary | ICD-10-CM

## 2023-05-13 DIAGNOSIS — E559 Vitamin D deficiency, unspecified: Secondary | ICD-10-CM | POA: Diagnosis not present

## 2023-05-13 DIAGNOSIS — F39 Unspecified mood [affective] disorder: Secondary | ICD-10-CM | POA: Diagnosis not present

## 2023-05-13 DIAGNOSIS — F5089 Other specified eating disorder: Secondary | ICD-10-CM | POA: Diagnosis not present

## 2023-05-13 MED ORDER — VITAMIN D (ERGOCALCIFEROL) 1.25 MG (50000 UNIT) PO CAPS: ORAL_CAPSULE | ORAL | 0 refills | Status: DC

## 2023-05-13 MED ORDER — ZEPBOUND 2.5 MG/0.5ML ~~LOC~~ SOAJ: 2.5000 mg | SUBCUTANEOUS | 0 refills | Status: DC

## 2023-05-13 NOTE — Progress Notes (Signed)
Amber Parrish, D.O.  ABFM, ABOM Specializing in Clinical Bariatric Medicine  Office located at: [address] [place], [place]  [address]   Assessment and Plan:   FOR THE DISEASE OF OBESITY: Since last office visit on 03/25/23 patient's  Muscle mass has decreased by 1.2 lb. Fat mass has decreased by 2 lb. Total body water has decreased by 0.4 lb.  Counseling done on how various foods will affect these numbers and how to maximize success  Total lbs lost to date: 13 lbs  Total weight loss percentage to date: 5.23%    Recommended Dietary Goals Amber Parrish is currently in the action stage of change. As such, her goal is to continue weight management plan.  She has agreed to: continue current plan   Behavioral Intervention We discussed the following today: continue to work on maintaining a reduced calorie state, getting the recommended amount of protein, incorporating whole foods, making healthy choices, staying well hydrated and practicing mindfulness when eating.  Additional resources provided today:  handout on benefits of strength training for weight loss and handout on food journaling log  Evidence-based interventions for health behavior change were utilized today including the discussion of self monitoring techniques, problem-solving barriers and SMART goal setting techniques.   Regarding patient's less desirable eating habits and patterns, we employed the technique of small changes.   Pt will specifically work on: journaling intake   Recommended Physical Activity Goals Amber Parrish has been advised to work up to 150 minutes of moderate intensity aerobic activity a week and strengthening exercises 2-3 times per week for cardiovascular health, weight loss maintenance and preservation of muscle mass.   She has agreed to :  continue walking regiment and consider adding some resistance training.    Pharmacotherapy We both agreed to :  continue Metformin and start  Zepbound   FOR ASSOCIATED CONDITIONS ADDRESSED TODAY:  BMI 38.0-38.9,adult-current BMI 39.97 Morbid obesity (HCC)-start bmi 42.23/start 04/24/21 Assessment & Plan: Amber Parrish started our program in 04/24/2021. In this 2 year period, she has had difficulties with weight loss. Her total weight loss is 13 lbs which is only 5.23% of her starting weight. Pt has struggles with overriding mental food cues and hunger/cravings. I anticipate that Amber Parrish would benefit from a GLP1-GIP to achieve significant medical weight loss to improve her health and metabolic dysfunction.   Amber Parrish wishes to move forward with Zepbound. Patient denies a personal history of pancreatitis;  and denies a family history of medullary thyroid carcinoma or multiple endocrine neoplasia type II.  I informed the patient that the demand for GLP-1-like medications is high and supply is low, therefore the medication can be difficult to obtain.  Strategies to obtain discussed with patient.  Potential risks/ benefits of the medication were explained to patient. Explained that the more she eats "off-plan", the more likely for her to have side effects of the drug.  All questions were answered appropriately.  Orders: -     Zepbound; Inject 2.5 mg into the skin once a week.  Dispense: 2 mL; Refill: 0   Pre-diabetes Assessment & Plan: Relevant medication: Metformin 500 mg 2 tablets with lunch and 1 tablet with dinner. Pt continues to have struggles with  overriding mental food cues and hunger/cravings. Maintain with Metformin at current dose. Start Zepbound.   Orders: -     Zepbound; Inject 2.5 mg into the skin once a week.  Dispense: 2 mL; Refill: 0   Mood disorder (HCC), with emotional eating Assessment &  Plan: Denies any SI/HI. Mood is stable. Pt is trying to be more mindful of the triggers that cause her to emotionally eat. Pt reminded that she can always see our bariatric psychologist. Continue mindfulness while eating.  Continue weight loss therapy.    Vitamin D deficiency Assessment & Plan: Pt compliant and tolerant with ERGO 50,000 units twice weekly. No concerns. Continue supplement- will refill today.   Orders: -     Vitamin D (Ergocalciferol); 1 po q Mon and 1 po q Thurs  Dispense: 8 capsule; Refill: 0   Follow up:   Return 06/16/2023. She was informed of the importance of frequent follow up visits to maximize her success with intensive lifestyle modifications for her multiple health conditions.  Subjective:   Chief complaint: Obesity Amber Parrish is here to discuss her progress with her obesity treatment plan. She is keeping a food journal and adhering to recommended goals of 1550-1650 calories and 110+ protein and states she is following her eating plan approximately 80% of the time. She states she is walking 30 minutes 5 days per week.  Interval History:  Amber Parrish is here for a follow up office visit. Since last OV on 03/25/23, Amber Parrish is down 3 lbs. Food wise, she endorses eating rich meals during the holiday period. However afterwards, she has been eating relatively healthy and getting in her protein. Pt is on Metformin currently which she feels is marginally effective. In the past, she has tried Wellbutrin and Topiramate which were discontinued due to ineffectiveness and or negative SE. We have also tried to get Semaglutide approved for her however it was denied even with prior authorization. Today, she states that she is on a new insurance and her husband recently got Mounjaro approved through their PCP. She is not sure if he has diabetes. She requests that I write for an AOM today.   Pharmacotherapy for weight loss: She is currently taking  Metformin 500 mg 2 tablets with lunch and 1 tablet with dinner .   Review of Systems:  Pertinent positives were addressed with patient today.  Reviewed by clinician on day of visit: allergies, medications, problem list, medical history, surgical  history, family history, social history, and previous encounter notes.  Weight Summary and Biometrics   Weight Lost Since Last Visit: 3lb  Weight Gained Since Last Visit: 0lb    Vitals Temp: 97.7 F (36.5 C) BP: 128/78 Pulse Rate: 71 SpO2: 97 %   Anthropometric Measurements Height: 5\' 4"  (1.626 m) Weight: 233 lb (105.7 kg) BMI (Calculated): 39.97 Weight at Last Visit: 236lb Weight Lost Since Last Visit: 3lb Weight Gained Since Last Visit: 0lb Starting Weight: 246lb Total Weight Loss (lbs): 13 lb (5.897 kg) Peak Weight: 258lb   Body Composition  Body Fat %: 45.4 % Fat Mass (lbs): 105.8 lbs Muscle Mass (lbs): 121 lbs Total Body Water (lbs): 86 lbs Visceral Fat Rating : 13   Other Clinical Data Fasting: no Labs: no Today's Visit #: 25 Starting Date: 04/24/21   Objective:   PHYSICAL EXAM: Blood pressure 128/78, pulse 71, temperature 97.7 F (36.5 C), height 5\' 4"  (1.626 m), weight 233 lb (105.7 kg), SpO2 97%. Body mass index is 39.99 kg/m.  General: she is overweight, cooperative and in no acute distress. PSYCH: Has normal mood, affect and thought process.   HEENT: EOMI, sclerae are anicteric. Lungs: Normal breathing effort, no conversational dyspnea. Extremities: Moves * 4 Neurologic: A and O * 3, good insight  DIAGNOSTIC DATA REVIEWED: BMET  Component Value Date/Time   NA 140 11/05/2022 1449   K 4.8 11/05/2022 1449   CL 105 11/05/2022 1449   CO2 21 11/05/2022 1449   GLUCOSE 95 11/05/2022 1449   GLUCOSE 82 01/26/2011 1450   BUN 20 11/05/2022 1449   CREATININE 0.80 11/05/2022 1449   CREATININE 0.61 01/26/2011 1450   CALCIUM 10.1 11/05/2022 1449   GFRNONAA 106 07/03/2019 1156   GFRAA 122 07/03/2019 1156   Lab Results  Component Value Date   HGBA1C 5.8 (H) 02/25/2023   HGBA1C 5.7 (H) 07/03/2019   Lab Results  Component Value Date   INSULIN 12.8 02/25/2023   INSULIN 16.7 07/03/2019   Lab Results  Component Value Date   TSH 1.120  04/24/2021   CBC    Component Value Date/Time   WBC 4.4 04/24/2021 0906   WBC 5.8 01/26/2011 1450   RBC 4.63 04/24/2021 0906   RBC 4.17 01/26/2011 1450   HGB 13.5 04/24/2021 0906   HCT 41.5 04/24/2021 0906   PLT 270 04/24/2021 0906   MCV 90 04/24/2021 0906   MCH 29.2 04/24/2021 0906   MCH 30.5 01/26/2011 1450   MCHC 32.5 04/24/2021 0906   MCHC 33.1 01/26/2011 1450   RDW 13.1 04/24/2021 0906   Iron Studies No results found for: "IRON", "TIBC", "FERRITIN", "IRONPCTSAT" Lipid Panel     Component Value Date/Time   CHOL 186 01/15/2022 0855   TRIG 78 01/15/2022 0855   HDL 70 01/15/2022 0855   CHOLHDL 3.4 11/15/2019 1124   LDLCALC 102 (H) 01/15/2022 0855   Hepatic Function Panel     Component Value Date/Time   PROT 7.6 11/05/2022 1449   ALBUMIN 4.9 11/05/2022 1449   AST 19 11/05/2022 1449   ALT 15 11/05/2022 1449   ALKPHOS 87 11/05/2022 1449   BILITOT 0.5 11/05/2022 1449      Component Value Date/Time   TSH 1.120 04/24/2021 0906   Nutritional Lab Results  Component Value Date   VD25OH 40.3 02/25/2023   VD25OH 44.4 11/05/2022   VD25OH 48.5 05/14/2022    Attestations:   I, Special Puri, acting as a Stage manager for [employer], DO., have compiled all relevant documentation for today's office visit on behalf of Amber Lot, DO, while in the presence of [employer], DO.  I have reviewed the above documentation for accuracy and completeness, and I agree with the above. Amber Parrish, D.O.  The 21st Century Cures Act was signed into law in 2016 which includes the topic of electronic health records.  This provides immediate access to information in MyChart.  This includes consultation notes, operative notes, office notes, lab results and pathology reports.  If you have any questions about what you read please let us know at your next visit so we can discuss your concerns and take corrective action if need be.  We are right here with you.

## 2023-05-23 ENCOUNTER — Other Ambulatory Visit (INDEPENDENT_AMBULATORY_CARE_PROVIDER_SITE_OTHER): Payer: Self-pay | Admitting: Family Medicine

## 2023-05-23 DIAGNOSIS — J3089 Other allergic rhinitis: Secondary | ICD-10-CM

## 2023-06-03 ENCOUNTER — Other Ambulatory Visit (INDEPENDENT_AMBULATORY_CARE_PROVIDER_SITE_OTHER): Payer: Self-pay | Admitting: Family Medicine

## 2023-06-03 DIAGNOSIS — R7303 Prediabetes: Secondary | ICD-10-CM

## 2023-06-15 ENCOUNTER — Encounter: Payer: Self-pay | Admitting: Internal Medicine

## 2023-06-16 ENCOUNTER — Ambulatory Visit (INDEPENDENT_AMBULATORY_CARE_PROVIDER_SITE_OTHER): Payer: 59 | Admitting: Family Medicine

## 2023-06-23 NOTE — Progress Notes (Unsigned)
 NEUROLOGY FOLLOW UP OFFICE NOTE  Amber Parrish [id number]  Assessment/Plan:   Chronic headaches, dizziness - at this point, unclear etiology.  She did  have an increased opening intracranial pressure of 26 mL water and noted improved symptoms for a short time.  However, returned to daily headaches not improved on acetazolamide.  Headaches are occipital, so may consider cervicogenic/occipital neuralgia component.     No improvement with increased acetazolamide.  Would treat for general chronic headaches. Start nortriptyline 10mg  at bedtime.  We can increase to 25mg  at bedtime in 4 weeks if needed. Continue tizanidine 4mg  at bedtime Continue acetazolamide 1000mg  twice daily for now.    Total time spent in chart and face to face with patient:  30 minutes    Subjective:  Amber Parrish is a 53 year old right-handed female who follows up for headaches.  CTA head and neck personally reviewed.   UPDATE: 04/12/2023 CTA HEAD & NECK:  No significant vascular abnormality.  Increased acetazolamide to 750mg  twice daily and then 1000mg  twice daily. Unclear if improved.  Continues to have daily headaches with episodes of sharp circumscribed pain right to occipital midline that radiates forward to between her eyes.  Associated blurred/fuzzy vision, photophobia and phonophobia (no nausea).  Lasts 30 seconds but with dull headache afterwards.   Current NSAIDS/analgesics:  ibuprofen (rare) Current triptans:  rizatriptan 10mg  Current ergotamine:  none Current anti-emetic:  none Current muscle relaxants:  tizanidine 2mg   at bedtime (cannot tolerate taking three times daily) Current Antihypertensive medications:  none Current Antidepressant medications:  none Current Anticonvulsant medications: none Current anti-CGRP:  none Current Vitamins/Herbal/Supplements:  none Current Antihistamines/Decongestants:  none Other therapy:  none Other medications:  acetazolamide 1000mg  twice  daily Hormone/birth control:  none   Caffeine:  occasional coffee Diet:  Hydrates.  Watches her diet.  No soda.  [facility] goes to [facility]. Exercise:  not routine Depression:  no; Anxiety: stable  Other pain:  no Sleep hygiene:  wakes up more often in the middle of the night. Maybe 6 hours of sleep.  No daytime sleepiness.     HISTORY:  She began having headaches around the summer 2022.   It is a unilateral pressure occipital headache (either side) that begins as 2-3/10 in the morning and gradually spreads to the forehead at a 6-7/10 intensity late in the day.  Sometimes neck pain and radiates into both temples by end of day.  She has photosensitivity and occasional dizziness but denies nausea, vomiting, phonophobia, autonomic symptoms, numbness or weakness.  They occur daily.  Bright light and intense smells are triggers.  Rest and hydration help relieve it.  MRI and MRV of brain with and without contrast in November 2022 were unremarkable.  Later that month, she started noticing blurred vision whenever concentrating/reading or using computer but denies visual obscurations.  When headaches are more severe, she may hear a whooshing in her head.  She tried treating with ibuprofen but since ineffective, she has not taken any analgesics.  Also with some neck pain.  X-ray of cervical spine was unremarkable   She had a formal eye exam in March 2024 which revealed no papilledema.  Evaluated for idiopathic intracranial hypertension.  CT head from 08/21/2022 was normal.  Underwent lumbar puncture on 09/01/2022 which revealed opening pressure of 26 cm water and closing pressure of 17 cm water.  CSF labs (cell count, glucose, protein, culture and cytology) were normal.  She was started on  acetazolamide ER 500mg  twice daily at that time.  She stopped the venlafaxine because she thought we were only going to try the acetazolamide.     Past NSAIDS/analgesics:  ibuprofen Past abortive  triptans:  none Past abortive ergotamine:  none Past muscle relaxants:  none Past anti-emetic:  none Past antihypertensive medications:  furosemide Past antidepressant medications:  Venlafaxine XR 75mg  daily, Sertraline, Wellbutrin Past anticonvulsant medications:  topiramate ER Past anti-CGRP:  none Past vitamins/Herbal/Supplements:  none Past antihistamines/decongestants:  Benadryl Other past therapies:  [facility] (Dr. Richardean Sale - effective)     Family history of headache:  not that she knows.   PAST MEDICAL HISTORY: Past Medical History:  Diagnosis Date   Bilateral hand swelling    Headache    Heartburn    Joint pain    Muscle pain    Other fatigue    Overweight    Shortness of breath on exertion     MEDICATIONS: Current Outpatient Medications on File Prior to Visit  Medication Sig Dispense Refill   acetaZOLAMIDE ER (DIAMOX) 500 MG capsule Take 2 capsules (1,000 mg total) by mouth 2 (two) times daily. 120 capsule 5   levocetirizine (XYZAL) 5 MG tablet Take 1 tablet (5 mg total) by mouth every evening. 90 tablet 0   metFORMIN (GLUCOPHAGE) 500 MG tablet 2 po with lunch and 1 with dinner daily 270 tablet 0   montelukast (SINGULAIR) 10 MG tablet Take 1 tablet (10 mg total) by mouth at bedtime. 90 tablet 0   rizatriptan (MAXALT) 10 MG tablet TAKE 1 TABLET BY MOUTH AS NEEDED. REPEAT IN 2 HOURS IF NEEDED. MAX 2TABS/24HRS 18 tablet 5   tirzepatide (ZEPBOUND) 2.5 MG/0.5ML Pen Inject 2.5 mg into the skin once a week. 2 mL 0   tiZANidine (ZANAFLEX) 2 MG tablet Take 1 tablet (2 mg total) by mouth 3 (three) times daily as needed. 270 tablet 1   Vitamin D, Ergocalciferol, (DRISDOL) 1.25 MG (50000 UNIT) CAPS capsule 1 po q Mon and 1 po q Thurs 8 capsule 0   No current facility-administered medications on file prior to visit.    ALLERGIES: Allergies  Allergen Reactions   Penicillins     FAMILY HISTORY: Family History  Problem Relation Age of Onset   Diabetes Mother     Hypertension Mother    Obesity Mother    Diabetes Father    Alcohol abuse Father    Arthritis Maternal Grandmother    Cancer Maternal Grandmother    Diabetes Maternal Grandmother    Hypertension Maternal Grandmother       Objective:  Blood pressure (!) 147/80, pulse 87, resp. rate 20, height 5\' 5"  (1.651 m), weight 242 lb (109.8 kg), SpO2 98%. General: No acute distress.  Patient appears well-groomed.       Shon Millet, DO  CC: Sharlot Gowda, MD

## 2023-06-24 ENCOUNTER — Other Ambulatory Visit (INDEPENDENT_AMBULATORY_CARE_PROVIDER_SITE_OTHER): Payer: Self-pay | Admitting: Family Medicine

## 2023-06-24 ENCOUNTER — Encounter: Payer: Self-pay | Admitting: Neurology

## 2023-06-24 ENCOUNTER — Ambulatory Visit: Payer: 59 | Admitting: Neurology

## 2023-06-24 VITALS — BP 147/80 | HR 87 | Resp 20 | Ht 65.0 in | Wt 242.0 lb

## 2023-06-24 DIAGNOSIS — R7303 Prediabetes: Secondary | ICD-10-CM

## 2023-06-24 DIAGNOSIS — R519 Headache, unspecified: Secondary | ICD-10-CM | POA: Diagnosis not present

## 2023-06-24 DIAGNOSIS — Z6838 Body mass index (BMI) 38.0-38.9, adult: Secondary | ICD-10-CM

## 2023-06-24 MED ORDER — NORTRIPTYLINE HCL 10 MG PO CAPS
10.0000 mg | ORAL_CAPSULE | Freq: Every day | ORAL | 5 refills | Status: DC
Start: 2023-06-24 — End: 2023-07-21

## 2023-06-24 NOTE — Patient Instructions (Addendum)
 Start nortriptyline 10mg  at bedtime.  If no improvement in headaches in 4 weeks, contact me and we will increase dose Continue acetazolamide 1000mg  twice daily  and tizanidine 4mg  at bedtime for now.   May use rizatriptan if needed Follow up 3 months.

## 2023-06-26 ENCOUNTER — Other Ambulatory Visit (INDEPENDENT_AMBULATORY_CARE_PROVIDER_SITE_OTHER): Payer: Self-pay | Admitting: Family Medicine

## 2023-06-26 DIAGNOSIS — J3089 Other allergic rhinitis: Secondary | ICD-10-CM

## 2023-06-30 ENCOUNTER — Ambulatory Visit (INDEPENDENT_AMBULATORY_CARE_PROVIDER_SITE_OTHER): Payer: 59 | Admitting: Family Medicine

## 2023-06-30 ENCOUNTER — Encounter (INDEPENDENT_AMBULATORY_CARE_PROVIDER_SITE_OTHER): Payer: Self-pay | Admitting: Family Medicine

## 2023-06-30 VITALS — BP 125/81 | HR 91 | Temp 98.1°F | Ht 65.0 in | Wt 236.0 lb

## 2023-06-30 DIAGNOSIS — J3089 Other allergic rhinitis: Secondary | ICD-10-CM

## 2023-06-30 DIAGNOSIS — Z6838 Body mass index (BMI) 38.0-38.9, adult: Secondary | ICD-10-CM

## 2023-06-30 DIAGNOSIS — R7303 Prediabetes: Secondary | ICD-10-CM

## 2023-06-30 DIAGNOSIS — R519 Headache, unspecified: Secondary | ICD-10-CM

## 2023-06-30 DIAGNOSIS — G8929 Other chronic pain: Secondary | ICD-10-CM

## 2023-06-30 DIAGNOSIS — Z6839 Body mass index (BMI) 39.0-39.9, adult: Secondary | ICD-10-CM

## 2023-06-30 DIAGNOSIS — E559 Vitamin D deficiency, unspecified: Secondary | ICD-10-CM

## 2023-06-30 MED ORDER — LEVOCETIRIZINE DIHYDROCHLORIDE 5 MG PO TABS: 5.0000 mg | ORAL_TABLET | Freq: Every evening | ORAL | 0 refills | Status: DC

## 2023-06-30 MED ORDER — METFORMIN HCL 500 MG PO TABS: ORAL_TABLET | ORAL | 0 refills | Status: DC

## 2023-06-30 MED ORDER — VITAMIN D (ERGOCALCIFEROL) 1.25 MG (50000 UNIT) PO CAPS: ORAL_CAPSULE | ORAL | 0 refills | Status: DC

## 2023-06-30 MED ORDER — MONTELUKAST SODIUM 10 MG PO TABS: 10.0000 mg | ORAL_TABLET | Freq: Every day | ORAL | 0 refills | Status: DC

## 2023-06-30 NOTE — Progress Notes (Signed)
 Amber Parrish, D.O.  ABFM, ABOM Specializing in Clinical Bariatric Medicine  Office located at: [address] [place], [place]  [address]   Assessment and Plan:  Will recheck labs (A1c, Vit D, BMP with GFR) next OV. FOR THE DISEASE OF OBESITY: BMI 38.0-38.9,adult-current BMI 39.97 Morbid obesity (HCC)-start bmi 42.23/start 04/24/21 Assessment & Plan: Since last office visit on 05/13/2023, patient's muscle mass has decreased by 1 lb. Fat mass has increased by 4.4 lbs. Total body water has increased by 4 lbs.  Counseling done on how various foods will affect these numbers and how to maximize success  Total lbs lost to date: 16 lbs Total weight loss percentage to date: 4.07%    Recommended Dietary Goals Amber Parrish is currently in the action stage of change. As such, her goal is to continue weight management plan.  She has agreed to: continue current plan   Behavioral Intervention We discussed the following today: Counseling on where to get GLP-1 medications to assist with insurance coverage (e.g. [employer]), continue with weight loss efforts such as following prudent nutritional plan  Additional resources provided today: None  Evidence-based interventions for health behavior change were utilized today including the discussion of self monitoring techniques, problem-solving barriers and SMART goal setting techniques.   Regarding patient's less desirable eating habits and patterns, we employed the technique of small changes.   Pt will specifically work on: n/a   Recommended Physical Activity Goals [name] has been advised to work up to 150 minutes of moderate intensity aerobic activity a week and strengthening exercises 2-3 times per week for cardiovascular health, weight loss maintenance and preservation of muscle mass.   She has agreed to :  Think about enjoyable ways to increase daily physical activity and overcoming barriers to exercise   Pharmacotherapy We both  agreed to : continue with nutritional and behavioral strategies and continue medication regimen   FOR ASSOCIATED CONDITIONS ADDRESSED TODAY:  Chronic nonintractable headache, unspecified headache type Assessment & Plan: [name] presented with migraine today affecting her vision in the left eye. She is taking Acetazolamide 1000 mg twice daily and Pamelor 10 mg daily for this condition. She reports seeing Dr. Everlena Cooper of neurology last week and received Pamelor, but has not noticed an improvement with symptoms. Recommended pt f/up with neurologist about persistence of symptoms to potentially get a different treatment plan. Continue medication regimen. Will monitor alongside specialist.    Pre-diabetes Assessment & Plan: Amber Parrish is on Metformin 500 mg 2 po with lunch and 1 with dinner daily for her PreDM. Tolerating well, hunger/cravings well controlled. Pt reports not taking Zepbound because it is cost prohibitive. Counseled pt on additional resources to receive GLP-1 medication. [employer] will continue to work on weight loss, exercise, via their meal plan we devised to help decrease the risk of progressing to diabetes. We will recheck A1c and BMP next OV.   Relevant Orders: -     metFORMIN HCl; 2 po with lunch and 1 with dinner daily  Dispense: 270 tablet; Refill: 0 -     Basic metabolic panel -     Hemoglobin A1c   Vitamin D deficiency Assessment & Plan: Pt is managing Vit D deficiency with ERGO 50000 1 po q Mon and 1 po q Thurs. Pt is tolerating well. Continue current supplementation regimen. Will recheck levels during next OV.   Relevant Orders: -     Vitamin D (Ergocalciferol); 1 po q Mon and 1 po q Thurs  Dispense: 24 capsule;  Refill: 0 -     VITAMIN D 25 Hydroxy (Vit-D Deficiency, Fractures)   Environmental and seasonal allergies Assessment & Plan: Amber Parrish reports no longer having anymore Xyzal or Singulair for this condition. She has no SE when taking. Her recent symptoms have been  making her feeling lethargic and have difficulty breathing. Will refill medications today to prevent symptoms worsening as seasons change. Will continue to monitor.   Relevant Orders: -     Montelukast Sodium; Take 1 tablet (10 mg total) by mouth at bedtime.  Dispense: 90 tablet; Refill: 0 -     Levocetirizine Dihydrochloride; Take 1 tablet (5 mg total) by mouth every evening.  Dispense: 90 tablet; Refill: 0  Follow up:   Return in about 2 months (around 09/02/2023). She was informed of the importance of frequent follow up visits to maximize her success with intensive lifestyle modifications for her multiple health conditions.  Subjective:   Chief complaint: Obesity Amber Parrish is here to discuss her progress with her obesity treatment plan. She is keeping a food journal and adhering to recommended goals of 1550-1650 calories and 110+ protein and states she is following her eating plan approximately 70% of the time. She states she is walking 30 minutes 5 days per week.  Interval History:  Amber Parrish is here for a follow up office visit. Since last OV on 05/13/2023, Amber Parrish is up 3 lbs. Amber Parrish reported today with a chronic headache making it more difficult to discuss details for visit. Pt mentions it is currently lacrosse season making it more common for her to eat later in the day.   Pharmacotherapy for weight loss: She is currently taking  Metformin 500 mg 2 po with lunch and 1 with dinner daily .   Review of Systems:  Pertinent positives were addressed with patient today.  Reviewed by clinician on day of visit: allergies, medications, problem list, medical history, surgical history, family history, social history, and previous encounter notes.  Weight Summary and Biometrics   Weight Lost Since Last Visit: 0  Weight Gained Since Last Visit: 3 lb   Vitals Temp: 98.1 F (36.7 C) BP: 125/81 Pulse Rate: 91 SpO2: 98 %   Anthropometric Measurements Height: 5\' 5"  (1.651  m) Weight: 236 lb (107 kg) BMI (Calculated): 39.27 Weight at Last Visit: 233 lb Weight Lost Since Last Visit: 0 Weight Gained Since Last Visit: 3 lb Starting Weight: 246 lb Total Weight Loss (lbs): 16 lb (7.258 kg) Peak Weight: 258 lb   Body Composition  Body Fat %: 46.6 % Fat Mass (lbs): 110.2 lbs Muscle Mass (lbs): 120 lbs Total Body Water (lbs): 90 lbs Visceral Fat Rating : 14   No data recorded  Objective:   PHYSICAL EXAM: Blood pressure 125/81, pulse 91, temperature 98.1 F (36.7 C), height 5\' 5"  (1.651 m), weight 236 lb (107 kg), SpO2 98%. Body mass index is 39.27 kg/m.  General: she is overweight, cooperative and in no acute distress. PSYCH: Has normal mood, affect and thought process.   HEENT: EOMI, sclerae are anicteric. Lungs: Normal breathing effort, no conversational dyspnea. Extremities: Moves * 4 Neurologic: A and O * 3, good insight  DIAGNOSTIC DATA REVIEWED: BMET    Component Value Date/Time   NA 140 11/05/2022 1449   K 4.8 11/05/2022 1449   CL 105 11/05/2022 1449   CO2 21 11/05/2022 1449   GLUCOSE 95 11/05/2022 1449   GLUCOSE 82 01/26/2011 1450   BUN 20 11/05/2022 1449   CREATININE 0.80 11/05/2022  1449   CREATININE 0.61 01/26/2011 1450   CALCIUM 10.1 11/05/2022 1449   GFRNONAA 106 07/03/2019 1156   GFRAA 122 07/03/2019 1156   Lab Results  Component Value Date   HGBA1C 5.8 (H) 02/25/2023   HGBA1C 5.7 (H) 07/03/2019   Lab Results  Component Value Date   INSULIN 12.8 02/25/2023   INSULIN 16.7 07/03/2019   Lab Results  Component Value Date   TSH 1.120 04/24/2021   CBC    Component Value Date/Time   WBC 4.4 04/24/2021 0906   WBC 5.8 01/26/2011 1450   RBC 4.63 04/24/2021 0906   RBC 4.17 01/26/2011 1450   HGB 13.5 04/24/2021 0906   HCT 41.5 04/24/2021 0906   PLT 270 04/24/2021 0906   MCV 90 04/24/2021 0906   MCH 29.2 04/24/2021 0906   MCH 30.5 01/26/2011 1450   MCHC 32.5 04/24/2021 0906   MCHC 33.1 01/26/2011 1450   RDW 13.1  04/24/2021 0906   Iron Studies No results found for: "IRON", "TIBC", "FERRITIN", "IRONPCTSAT" Lipid Panel     Component Value Date/Time   CHOL 186 01/15/2022 0855   TRIG 78 01/15/2022 0855   HDL 70 01/15/2022 0855   CHOLHDL 3.4 11/15/2019 1124   LDLCALC 102 (H) 01/15/2022 0855   Hepatic Function Panel     Component Value Date/Time   PROT 7.6 11/05/2022 1449   ALBUMIN 4.9 11/05/2022 1449   AST 19 11/05/2022 1449   ALT 15 11/05/2022 1449   ALKPHOS 87 11/05/2022 1449   BILITOT 0.5 11/05/2022 1449      Component Value Date/Time   TSH 1.120 04/24/2021 0906   Nutritional Lab Results  Component Value Date   VD25OH 40.3 02/25/2023   VD25OH 44.4 11/05/2022   VD25OH 48.5 05/14/2022    Attestations:   I, Camryn Mix, acting as a Stage manager for [employer], DO., have compiled all relevant documentation for today's office visit on behalf of Thomasene Lot, DO, while in the presence of [employer], DO.   I have reviewed the above documentation for accuracy and completeness, and I agree with the above. Amber Parrish, D.O.  The 21st Century Cures Act was signed into law in 2016 which includes the topic of electronic health records.  This provides immediate access to information in MyChart.  This includes consultation notes, operative notes, office notes, lab results and pathology reports.  If you have any questions about what you read please let us know at your next visit so we can discuss your concerns and take corrective action if need be.  We are right here with you.

## 2023-07-19 ENCOUNTER — Other Ambulatory Visit: Payer: Self-pay | Admitting: Neurology

## 2023-08-24 ENCOUNTER — Ambulatory Visit: Payer: Self-pay

## 2023-08-24 ENCOUNTER — Ambulatory Visit: Admitting: Medical

## 2023-08-24 VITALS — BP 130/78 | HR 90 | Ht 65.0 in | Wt 243.0 lb

## 2023-08-24 DIAGNOSIS — M542 Cervicalgia: Secondary | ICD-10-CM

## 2023-08-24 DIAGNOSIS — R29898 Other symptoms and signs involving the musculoskeletal system: Secondary | ICD-10-CM | POA: Diagnosis not present

## 2023-08-24 DIAGNOSIS — M62838 Other muscle spasm: Secondary | ICD-10-CM | POA: Diagnosis not present

## 2023-08-24 DIAGNOSIS — M25512 Pain in left shoulder: Secondary | ICD-10-CM | POA: Diagnosis not present

## 2023-08-24 MED ORDER — PREDNISONE 10 MG PO TABS: ORAL_TABLET | ORAL | 0 refills | Status: DC

## 2023-08-24 MED ORDER — OXYCODONE-ACETAMINOPHEN 5-325 MG PO TABS: 1.0000 | ORAL_TABLET | ORAL | 0 refills | Status: AC | PRN

## 2023-08-24 NOTE — Telephone Encounter (Signed)
 Pt coming in today for her shoulder pain

## 2023-08-24 NOTE — Telephone Encounter (Signed)
  Chief Complaint: left shoulder pain Symptoms: shoulder pain Frequency: since friday Pertinent Negatives: Patient denies injury Disposition: [] ED /[] Urgent Care (no appt availability in office) / [x] Appointment(In office/virtual)/ []   [facility]/ [] Home Care/ [] Refused Recommended Disposition /[] [facility]/ []  Follow-up with PCP Additional Notes: pt states pain started Friday morning and has been constant. States that when she takes a deep breathe she can feel the pain in her left shoulder.  States pain currently 2/10 but last night it was a 10/10. States anything that puts pressure on her shoulder makes it more intense.   Copied from CRM [id number]. Topic: Clinical - Red Word Triage >> Aug 24, 2023 10:39 AM Marissa P wrote: Red Word that prompted transfer to Nurse Triage: Patient has severe sharp, stabbing pain in left shoulder and tightness in chest. Hurts to breathe. Reason for Disposition  [1] Unable to use arm at all AND [2] because of shoulder pain or stiffness  Answer Assessment - Initial Assessment Questions 1. ONSET: "When did the pain start?"     Friday 2. LOCATION: "Where is the pain located?"     Left shoulder 3. PAIN: "How bad is the pain?" (Scale 1-10; or mild, moderate, severe)   - MILD (1-3): doesn't interfere with normal activities   - MODERATE (4-7): interferes with normal activities (e.g., work or school) or awakens from sleep   - SEVERE (8-10): excruciating pain, unable to do any normal activities, unable to move arm at all due to pain     2/10 4. WORK OR EXERCISE: "Has there been any recent work or exercise that involved this part of the body?"     no 5. CAUSE: "What do you think is causing the shoulder pain?"     unknown 6. OTHER SYMPTOMS: "Do you have any other symptoms?" (e.g., neck pain, swelling, rash, fever, numbness, weakness)     no  Protocols used: Shoulder Pain-A-AH

## 2023-08-24 NOTE — Progress Notes (Signed)
 Subjective:  Amber Parrish is a 53 y.o. female who presents for Chief Complaint  Patient presents with   Shoulder Pain    Woke up Friday with pain left shoulder, it is the back. Movement, anything quick is stabbing pain. She was at a conference Friday and thought it might be from sleeping in hotel, has continued. Last night (at home) only slept a few hours, can't get comfortable.      Here for c/o left shoulder pain.  She does not normally have history of chronic neck or shoulder pain.  Friday 5 days ago she was at a conference and woke up with severe pain in her left shoulder.  She did no go to bed with pain but woke up in pain.  She woke up in pain that has persisted till today quite severe.  She denies any recent injury trauma or fall.  She does not exercise fairly regularly so she did not do any type of activity recent that she thinks would have aggravated the pain.  The pain has persisted despite taking ibuprofen over-the-counter twice daily.  Given her history of migraines she typically uses tizanidine  muscle laxer every night which does help her migraines but has not helped this pain at all.  She notes in the last few days now has neck stiffness and pain as well.  She does get some sharp pains that go down her arm and her left back upper.  She has had some stabbing pains in the posterior left shoulder.  She cannot really lie on either shoulder as she gets pain.  No position really helps.  Neck range of motion is decreased now in the last few days.  She works as an Airline pilot.  She does have a significant history of migraines that are not all that well-controlled despite her current medications including nortriptyline .  She is right-handed.  No history of left shoulder issues in the past but it has hurt since Friday.  Trying to lift or hold her arm out hurts but if she uses her right arm to lift her left arm it does not hurt.  No other aggravating or relieving factors.    No other  c/o.  Past Medical History:  Diagnosis Date   Bilateral hand swelling    Headache    Heartburn    Joint pain    Muscle pain    Other fatigue    Overweight    Shortness of breath on exertion     Current Outpatient Medications on File Prior to Visit  Medication Sig Dispense Refill   acetaZOLAMIDE  ER (DIAMOX ) 500 MG capsule Take 2 capsules (1,000 mg total) by mouth 2 (two) times daily. 120 capsule 5   levocetirizine (XYZAL ) 5 MG tablet Take 1 tablet (5 mg total) by mouth every evening. 90 tablet 0   metFORMIN  (GLUCOPHAGE ) 500 MG tablet 2 po with lunch and 1 with dinner daily 270 tablet 0   montelukast  (SINGULAIR ) 10 MG tablet Take 1 tablet (10 mg total) by mouth at bedtime. 90 tablet 0   nortriptyline  (PAMELOR ) 10 MG capsule TAKE 1 CAPSULE BY MOUTH AT BEDTIME. 90 capsule 1   tiZANidine  (ZANAFLEX ) 2 MG tablet Take 1 tablet (2 mg total) by mouth 3 (three) times daily as needed. 270 tablet 1   Vitamin D , Ergocalciferol , (DRISDOL ) 1.25 MG (50000 UNIT) CAPS capsule 1 po q Mon and 1 po q Thurs 24 capsule 0   rizatriptan  (MAXALT ) 10 MG tablet TAKE 1 TABLET BY MOUTH  AS NEEDED. REPEAT IN 2 HOURS IF NEEDED. MAX 2TABS/24HRS (Patient not taking: Reported on 08/24/2023) 18 tablet 5   No current facility-administered medications on file prior to visit.   Past Surgical History:  Procedure Laterality Date   ANTERIOR CRUCIATE LIGAMENT REPAIR  08/1998   CESAREAN SECTION  08/18/2001     The following portions of the patient's history were reviewed and updated as appropriate: allergies, current medications, past family history, past medical history, past social history, past surgical history and problem list.  ROS Otherwise as in subjective above  Objective: BP 130/78   Pulse 90   Ht 5\' 5"  (1.651 m)   Wt 243 lb (110.2 kg)   LMP 06/21/2023   SpO2 97%   BMI 40.44 kg/m   General appearance: alert, no distress, well developed, well nourished Neck range of motion reduced in general all  directions, mild tenderness of lateral neck and posterior neck on the left.  No mass or lymphadenopathy or thyromegaly. Tender left upper back paraspinal region and supraspinatus region.  Otherwise back nontender MSK: Tender over the anterior and posterior left shoulder, pain is noted with range of motion but range of motion seems relatively full.  Passive range of motion does not seem to cause pain but active range of motion of shoulder does cause pain.  No obvious swelling.  Rest of arm unremarkable Left arm strength slightly reduced compared to the right. Normal DTRs. No extremity edema. No discoloration or erythema or bruising of the arms or neck. Normal upper extremity pulses    Assessment: Encounter Diagnoses  Name Primary?   Neck pain Yes   Acute pain of left shoulder    Muscle spasm    Decreased ROM of neck      Plan: We discussed symptoms and concerns.  Symptoms suggest more radicular type of pain likely coming from the cervical spine more so than a specific shoulder issue.  We discussed differential in general which could include bulging disc in the neck, radicular pain arising from the neck, bursitis of the shoulder, other derangement of the shoulder, other.  Advise she use whatever positioning seems to take some of the pain away.  Advise relative rest.  Can consider soft neck collar and left arm sling if desired.  She declines at this time.  Advised she begin prednisone taper starting today.  Can use Percocet as below as needed for worse pain or breakthrough pain.  She can use her tizanidine  muscle laxer once or twice a day for the next 2 days or alternate by at least 3 to 4 hours from the Percocet.  As pain subsides she can work on some gradual range of motion activity and stretching.  If no improvement or worse within the next week then call back or recheck.  We discussed cervical spine x-ray she had March 2023 showing some mild degenerative changes of the cervical  spine.  No prior shoulder imaging. she declines any imaging today but offered updated neck x-ray left shoulder x-ray.  Advised recheck or follow-up within a week or so.  Mckinney was seen today for shoulder pain.  Diagnoses and all orders for this visit:  Neck pain  Acute pain of left shoulder  Muscle spasm  Decreased ROM of neck  Other orders -     oxyCODONE-acetaminophen (PERCOCET/ROXICET) 5-325 MG tablet; Take 1 tablet by mouth every 4 (four) hours as needed for up to 5 days for severe pain (pain score 7-10). -     predniSONE (  DELTASONE) 10 MG tablet; 6 tablets all together day 1, 5 tablets day 2, 4 tablets day 3, 3 tablets day 4, 2 tablets day 5, 1 tablet day 6.    Follow up: 1 week

## 2023-08-25 ENCOUNTER — Ambulatory Visit: Admitting: Family Medicine

## 2023-09-02 ENCOUNTER — Ambulatory Visit (INDEPENDENT_AMBULATORY_CARE_PROVIDER_SITE_OTHER): Admitting: Family Medicine

## 2023-09-28 ENCOUNTER — Other Ambulatory Visit (INDEPENDENT_AMBULATORY_CARE_PROVIDER_SITE_OTHER): Payer: Self-pay | Admitting: Family Medicine

## 2023-09-28 DIAGNOSIS — R7303 Prediabetes: Secondary | ICD-10-CM

## 2023-09-28 DIAGNOSIS — J3089 Other allergic rhinitis: Secondary | ICD-10-CM

## 2023-09-29 NOTE — Progress Notes (Signed)
 NEUROLOGY FOLLOW UP OFFICE NOTE  Amber Parrish [id number]  Assessment/Plan:   Chronic headaches, dizziness - at this point, unclear etiology.  She did  have an increased opening intracranial pressure of 26 mL water and noted improved symptoms for a short time.  However, returned to daily headaches not improved on acetazolamide .  Headaches are occipital, so may consider cervicogenic/occipital neuralgia component.  However, these could be chronic migraines as well   Will treat for presumed migraines.  Discontinue nortriptyline .  Plan to start Qulipta 60mg  daily Continue rizatriptan  10mg  Follow up with ophthalmology Follow up in 4 months.      Subjective:  Amber Parrish is a 53 year old right-handed female who follows up for headaches.  CTA head and neck personally reviewed.   UPDATE: Started nortriptyline .  She notes unwanted increased appetite since initiation.   Discontinued acetazolamide  and tizanidine  but thought needed to discontinue Sharp occipital headache - now only a couple evening 4-6 PM less intense - 30 seconds and then dull headache  Subtle ones in afternoon  Continues to have daily headaches with episodes of sharp circumscribed pain right to occipital midline that radiates forward to between her eyes.  Associated blurred/fuzzy vision, photophobia and phonophobia (no nausea).  Lasts 30 seconds but with dull headache afterwards. Dull headache daily - fuzzy vision later in day No pulsatile tinnitus - usually with the sharp headache in 2 weeks.   Current NSAIDS/analgesics:  ibuprofen (rare) Current triptans:  rizatriptan  10mg  Current ergotamine:  none Current anti-emetic:  none Current muscle relaxants:  tizanidine  2mg   at bedtime (cannot tolerate taking three times daily) Current Antihypertensive medications:  none Current Antidepressant medications:  none Current Anticonvulsant medications: none Current anti-CGRP:  none Current  Vitamins/Herbal/Supplements:  none Current Antihistamines/Decongestants:  none Other therapy:  none Other medications:  acetazolamide  1000mg  twice daily Hormone/birth control:  none   Caffeine:  occasional coffee Diet:  Hydrates.  Watches her diet.  No soda.  [facility]. Exercise:  not routine Depression:  no; Anxiety: stable  Other pain:  no Sleep hygiene:  wakes up more often in the middle of the night. Maybe 6 hours of sleep.  No daytime sleepiness.     HISTORY:  She began having headaches around the summer 2022.   It is a unilateral pressure occipital headache (either side) that begins as 2-3/10 in the morning and gradually spreads to the forehead at a 6-7/10 intensity late in the day.  Sometimes neck pain and radiates into both temples by end of day.  She has photosensitivity and occasional dizziness but denies nausea, vomiting, phonophobia, autonomic symptoms, numbness or weakness.  They occur daily.  Bright light and intense smells are triggers.  Rest and hydration help relieve it.  MRI and MRV of brain with and without contrast in November 2022 were unremarkable.  Later that month, she started noticing blurred vision whenever concentrating/reading or using computer but denies visual obscurations.  When headaches are more severe, she may hear a whooshing in her head.  She tried treating with ibuprofen but since ineffective, she has not taken any analgesics.  Also with some neck pain.  X-ray of cervical spine was unremarkable   She had a formal eye exam in March 2024 which revealed no papilledema.  Evaluated for idiopathic intracranial hypertension.  CT head from 08/21/2022 was normal.  Underwent lumbar puncture on 09/01/2022 which revealed opening pressure of 26 cm water and closing pressure of 17  cm water.  CSF labs (cell count, glucose, protein, culture and cytology) were normal.  She was started on acetazolamide  ER 500mg  twice daily at that time.  She  stopped the venlafaxine  because she thought we were only going to try the acetazolamide .    04/12/2023 CTA HEAD & NECK:  No significant vascular abnormality.     Past NSAIDS/analgesics:  ibuprofen Past abortive triptans:  none Past abortive ergotamine:  none Past muscle relaxants:  none Past anti-emetic:  none Past antihypertensive medications:  furosemide  Past antidepressant medications:  Venlafaxine  XR 75mg  daily, Sertraline , Wellbutrin  Past anticonvulsant medications:  topiramate  ER Past anti-CGRP:  none Past vitamins/Herbal/Supplements:  none Past antihistamines/decongestants:  Benadryl Other past therapies:  [facility] (Dr. Ulysees Gander - effective)     Family history of headache:  not that she knows.   PAST MEDICAL HISTORY: Past Medical History:  Diagnosis Date   Bilateral hand swelling    Headache    Heartburn    Joint pain    Muscle pain    Other fatigue    Overweight    Shortness of breath on exertion     MEDICATIONS: Current Outpatient Medications on File Prior to Visit  Medication Sig Dispense Refill   acetaZOLAMIDE  ER (DIAMOX ) 500 MG capsule Take 2 capsules (1,000 mg total) by mouth 2 (two) times daily. 120 capsule 5   levocetirizine (XYZAL ) 5 MG tablet Take 1 tablet (5 mg total) by mouth every evening. 90 tablet 0   metFORMIN  (GLUCOPHAGE ) 500 MG tablet 2 po with lunch and 1 with dinner daily 270 tablet 0   montelukast  (SINGULAIR ) 10 MG tablet Take 1 tablet (10 mg total) by mouth at bedtime. 90 tablet 0   nortriptyline  (PAMELOR ) 10 MG capsule TAKE 1 CAPSULE BY MOUTH AT BEDTIME. 90 capsule 1   predniSONE  (DELTASONE ) 10 MG tablet 6 tablets all together day 1, 5 tablets day 2, 4 tablets day 3, 3 tablets day 4, 2 tablets day 5, 1 tablet day 6. 21 tablet 0   rizatriptan  (MAXALT ) 10 MG tablet TAKE 1 TABLET BY MOUTH AS NEEDED. REPEAT IN 2 HOURS IF NEEDED. MAX 2TABS/24HRS (Patient not taking: Reported on 08/24/2023) 18 tablet 5   tiZANidine  (ZANAFLEX ) 2 MG tablet Take 1  tablet (2 mg total) by mouth 3 (three) times daily as needed. 270 tablet 1   Vitamin D , Ergocalciferol , (DRISDOL ) 1.25 MG (50000 UNIT) CAPS capsule 1 po q Mon and 1 po q Thurs 24 capsule 0   No current facility-administered medications on file prior to visit.    ALLERGIES: Allergies  Allergen Reactions   Penicillins Other (See Comments)    FAMILY HISTORY: Family History  Problem Relation Age of Onset   Diabetes Mother    Hypertension Mother    Obesity Mother    Diabetes Father    Alcohol abuse Father    Arthritis Maternal Grandmother    Cancer Maternal Grandmother    Diabetes Maternal Grandmother    Hypertension Maternal Grandmother       Objective:  Blood pressure 134/85, pulse 80, height 5' 5 (1.651 m), weight 250 lb (113.4 kg), SpO2 97%. General: No acute distress.  Patient appears well-groomed.   Head:  Normocephalic/atraumatic Neck:  Supple.  No paraspinal tenderness.  Full range of motion. Heart:  Regular rate and rhythm. Neuro:  Alert and oriented.  Speech fluent and not dysarthric.  Language intact.  CN II-XII intact.  Bulk and tone normal.  Muscle strength 5/5 throughout.  Sensation to light touch  intact.  Deep tendon reflexes 2+ throughout, toes downgoing.  Gait normal.  Romberg negative.     Janne Members, DO  CC: Ron Cobbs, MD

## 2023-09-30 ENCOUNTER — Encounter: Payer: Self-pay | Admitting: Neurology

## 2023-09-30 ENCOUNTER — Ambulatory Visit: Admitting: Neurology

## 2023-09-30 ENCOUNTER — Telehealth: Payer: Self-pay

## 2023-09-30 VITALS — BP 134/85 | HR 80 | Ht 65.0 in | Wt 250.0 lb

## 2023-09-30 DIAGNOSIS — G43709 Chronic migraine without aura, not intractable, without status migrainosus: Secondary | ICD-10-CM

## 2023-09-30 MED ORDER — QULIPTA 60 MG PO TABS: 60.0000 mg | ORAL_TABLET | Freq: Every day | ORAL | 11 refills | Status: DC

## 2023-09-30 MED ORDER — TIZANIDINE HCL 2 MG PO TABS: 2.0000 mg | ORAL_TABLET | Freq: Three times a day (TID) | ORAL | 5 refills | Status: DC | PRN

## 2023-09-30 NOTE — Patient Instructions (Signed)
 Stop nortriptyline .  Start Qulipta 60mg  daily Tizanidine  as needed Rizatriptan  as needed Limit use of pain relievers to no more than 9 days out of the month to prevent risk of rebound or medication-overuse headache. Follow up with eye doctor and have notes faxed to me. Follow up 4 months.

## 2023-09-30 NOTE — Telephone Encounter (Signed)
 PA needed for [facility]

## 2023-10-01 ENCOUNTER — Other Ambulatory Visit (HOSPITAL_COMMUNITY): Payer: Self-pay

## 2023-10-01 ENCOUNTER — Telehealth: Payer: Self-pay

## 2023-10-01 NOTE — Telephone Encounter (Signed)
 Pharmacy Patient Advocate Encounter   Received notification from Pt Calls Messages that prior authorization for QULIPTA  60 MG TABLE is required/requested.   Insurance verification completed.   The patient is insured through [facility] ADVANTAGE/RX ADVANCE .   Per test claim: PA required; PA submitted to above mentioned insurance via Prompt PA Key/confirmation #/EOC [id number] Status is pending

## 2023-10-05 NOTE — Telephone Encounter (Signed)
 Pharmacy Patient Advocate Encounter  Received notification from [facility] [employer]VANTAGE/RX ADVANCE that Prior Authorization for QULIPTA  60 MG TABLET  has been APPROVED from 10-04-2023 to 10-02-2024   PA #/Case ID/Reference #: [id number]

## 2023-10-19 ENCOUNTER — Encounter (INDEPENDENT_AMBULATORY_CARE_PROVIDER_SITE_OTHER): Payer: Self-pay | Admitting: Family Medicine

## 2023-10-19 ENCOUNTER — Ambulatory Visit (INDEPENDENT_AMBULATORY_CARE_PROVIDER_SITE_OTHER): Admitting: Family Medicine

## 2023-10-19 VITALS — BP 137/79 | HR 89 | Temp 98.7°F | Ht 65.0 in | Wt 238.0 lb

## 2023-10-19 DIAGNOSIS — R7303 Prediabetes: Secondary | ICD-10-CM | POA: Diagnosis not present

## 2023-10-19 DIAGNOSIS — Z6839 Body mass index (BMI) 39.0-39.9, adult: Secondary | ICD-10-CM

## 2023-10-19 DIAGNOSIS — J3089 Other allergic rhinitis: Secondary | ICD-10-CM | POA: Diagnosis not present

## 2023-10-19 DIAGNOSIS — J302 Other seasonal allergic rhinitis: Secondary | ICD-10-CM | POA: Diagnosis not present

## 2023-10-19 DIAGNOSIS — E669 Obesity, unspecified: Secondary | ICD-10-CM

## 2023-10-19 DIAGNOSIS — G43809 Other migraine, not intractable, without status migrainosus: Secondary | ICD-10-CM

## 2023-10-19 DIAGNOSIS — E559 Vitamin D deficiency, unspecified: Secondary | ICD-10-CM

## 2023-10-19 MED ORDER — VITAMIN D (ERGOCALCIFEROL) 1.25 MG (50000 UNIT) PO CAPS
ORAL_CAPSULE | ORAL | 0 refills | Status: DC
Start: 2023-10-19 — End: 2024-01-05

## 2023-10-19 MED ORDER — METFORMIN HCL 500 MG PO TABS: ORAL_TABLET | ORAL | 0 refills | Status: DC

## 2023-10-19 MED ORDER — MONTELUKAST SODIUM 10 MG PO TABS
10.0000 mg | ORAL_TABLET | Freq: Every day | ORAL | 0 refills | Status: DC
Start: 2023-10-19 — End: 2024-01-05

## 2023-10-19 MED ORDER — LEVOCETIRIZINE DIHYDROCHLORIDE 5 MG PO TABS: 5.0000 mg | ORAL_TABLET | Freq: Every evening | ORAL | 0 refills | Status: DC

## 2023-10-19 NOTE — Progress Notes (Signed)
 [name], D.O.  ABFM, ABOM Specializing in Clinical Bariatric Medicine  Office located at: [address] [place], [place]  [address]   Assessment and Plan:  No orders of the defined types were placed in this encounter.   There are no discontinued medications.   No orders of the defined types were placed in this encounter.   Labs obtained today (Vit D, insulin , A1c, and BMP) will be reviewed at next OV.    FOR THE DISEASE OF OBESITY: *** Assessment & Plan: Since last office visit on 06/19/23 patient's muscle mass has increased by 10 lbs. Fat mass has decreased by 8.4 lbs. Total body water has increased by 3.4 lbs.  Counseling done on how various foods will affect these numbers and how to maximize success  Total lbs lost to date: 8 lbs Total weight loss percentage to date: -3.25%    Recommended Dietary Goals Amber Parrish is currently in the action stage of change. As such, her goal is to continue weight management plan.  She has agreed to: continue current plan   Behavioral Intervention We discussed the following today: increasing lean protein intake to established goals, decreasing simple carbohydrates , avoiding skipping meals, and increasing water intake   Additional resources provided today: None  Evidence-based interventions for health behavior change were utilized today including the discussion of self monitoring techniques, problem-solving barriers and SMART goal setting techniques.   Regarding patient's less desirable eating habits and patterns, we employed the technique of small changes.   Pt will specifically work on: Work on journaling daily intake and bring her log with her to next OV.     Recommended Physical Activity Goals Amber Parrish has been advised to work up to 300-450 minutes of moderate intensity aerobic activity a week and strengthening exercises 2-3 times per week for cardiovascular health, weight loss maintenance and preservation of muscle mass.    She has agreed to: {EMEXERCISE:28847::Think about enjoyable ways to increase daily physical activity and overcoming barriers to exercise,Increase physical activity in their day and reduce sedentary time (increase NEAT).}   Pharmacotherapy We both agreed to: {EMagreedrx:29170}   ASSOCIATED CONDITIONS ADDRESSED TODAY: Pre-diabetes Assessment & Plan: Lab Results  Component Value Date   HGBA1C 5.8 (H) 02/25/2023   HGBA1C 6.0 (H) 11/05/2022   HGBA1C 5.5 05/14/2022   INSULIN  12.8 02/25/2023   INSULIN  13.1 11/05/2022   INSULIN  15.0 01/15/2022        ***  Environmental and seasonal allergies Assessment & Plan: ***  Vitamin D  deficiency Assessment & Plan: Lab Results  Component Value Date   VD25OH 40.3 02/25/2023   VD25OH 44.4 11/05/2022   VD25OH 48.5 05/14/2022   Compliant with  ***   Follow up:   Return for Keep upcoming appt iwth Dr. MALVA on 11/09/2023 @ 4:00 PM (Arrive by 3:50 PM). She was informed of the importance of frequent follow up visits to maximize her success with intensive lifestyle modifications for her multiple health conditions.   Subjective:   Chief complaint: Obesity Amber Parrish is here to discuss her progress with her obesity treatment plan. She is keeping a food journal and adhering to recommended goals of 1550-1650 calories and 110+ g protein and states she is following her eating plan approximately 50% of the time. She states she is walking and playing volley ball 30-40 minutes *** days per week.  Interval History:  Amber Parrish is here for a follow up office visit. Since last OV on *** , she is ***.  Reports increased hunger  Has been traveling a lot so she has been trying to prioritize healthy foods and ordering foods such as kale salad with chicken breast *** Brought a lot of protein bars with her. ***        Pharmacotherapy that aid with weight loss: She is currently taking Metformin  1,000 with lunch and 500 mg with dinner.    Review of Systems:  Pertinent positives were addressed with patient today.  Reviewed by clinician on day of visit: allergies, medications, problem list, medical history, surgical history, family history, social history, and previous encounter notes.  Weight Summary and Biometrics   Weight Lost Since Last Visit: 0lb  Weight Gained Since Last Visit: 2lb  ***  Vitals Temp: 98.7 F (37.1 C) BP: 137/79 Pulse Rate: 89 SpO2: 98 %   Anthropometric Measurements Height: 5' 5 (1.651 m) Weight: 238 lb (108 kg) BMI (Calculated): 39.61 Weight at Last Visit: 236lb Weight Lost Since Last Visit: 0lb Weight Gained Since Last Visit: 2lb Starting Weight: 246lb Total Weight Loss (lbs): 14 lb (6.35 kg)   Body Composition  Body Fat %: 42.6 % Fat Mass (lbs): 101.8 lbs Muscle Mass (lbs): 130 lbs Total Body Water (lbs): 93.4 lbs Visceral Fat Rating : 12   Other Clinical Data Fasting: No Labs: no Today's Visit #: 26 Starting Date: 04/24/21    Objective:   PHYSICAL EXAM: Blood pressure 137/79, pulse 89, temperature 98.7 F (37.1 C), height 5' 5 (1.651 m), weight 238 lb (108 kg), SpO2 98%. Body mass index is 39.61 kg/m.  General: she is overweight, cooperative and in no acute distress. PSYCH: Has normal mood, affect and thought process.   HEENT: EOMI, sclerae are anicteric. Lungs: Normal breathing effort, no conversational dyspnea. Extremities: Moves * 4 Neurologic: A and O * 3, good insight  DIAGNOSTIC DATA REVIEWED: BMET    Component Value Date/Time   NA 140 11/05/2022 1449   K 4.8 11/05/2022 1449   CL 105 11/05/2022 1449   CO2 21 11/05/2022 1449   GLUCOSE 95 11/05/2022 1449   GLUCOSE 82 01/26/2011 1450   BUN 20 11/05/2022 1449   CREATININE 0.80 11/05/2022 1449   CREATININE 0.61 01/26/2011 1450   CALCIUM 10.1 11/05/2022 1449   GFRNONAA 106 07/03/2019 1156   GFRAA 122 07/03/2019 1156   Lab Results  Component Value Date   HGBA1C 5.8 (H) 02/25/2023   HGBA1C  5.7 (H) 07/03/2019   Lab Results  Component Value Date   INSULIN  12.8 02/25/2023   INSULIN  16.7 07/03/2019   Lab Results  Component Value Date   TSH 1.120 04/24/2021   CBC    Component Value Date/Time   WBC 4.4 04/24/2021 0906   WBC 5.8 01/26/2011 1450   RBC 4.63 04/24/2021 0906   RBC 4.17 01/26/2011 1450   HGB 13.5 04/24/2021 0906   HCT 41.5 04/24/2021 0906   PLT 270 04/24/2021 0906   MCV 90 04/24/2021 0906   MCH 29.2 04/24/2021 0906   MCH 30.5 01/26/2011 1450   MCHC 32.5 04/24/2021 0906   MCHC 33.1 01/26/2011 1450   RDW 13.1 04/24/2021 0906   Iron Studies No results found for: IRON, TIBC, FERRITIN, IRONPCTSAT Lipid Panel     Component Value Date/Time   CHOL 186 01/15/2022 0855   TRIG 78 01/15/2022 0855   HDL 70 01/15/2022 0855   CHOLHDL 3.4 11/15/2019 1124   LDLCALC 102 (H) 01/15/2022 0855   Hepatic Function Panel     Component Value Date/Time  PROT 7.6 11/05/2022 1449   ALBUMIN 4.9 11/05/2022 1449   AST 19 11/05/2022 1449   ALT 15 11/05/2022 1449   ALKPHOS 87 11/05/2022 1449   BILITOT 0.5 11/05/2022 1449      Component Value Date/Time   TSH 1.120 04/24/2021 0906   Nutritional Lab Results  Component Value Date   VD25OH 40.3 02/25/2023   VD25OH 44.4 11/05/2022   VD25OH 48.5 05/14/2022    Attestations:   I, Vernell Forest, acting as a medical scribe for Barnie Jenkins, DO., have compiled all relevant documentation for today's office visit on behalf of Barnie Jenkins, DO, while in the presence of [employer], DO.  Reviewed by clinician on day of visit: allergies, medications, problem list, medical history, surgical history, family history, social history, and previous encounter notes pertinent to patient's obesity diagnosis. I have spent 40 *** minutes in the care of the patient today including: preparing to see patient (e.g. review and interpretation of tests, old notes ), obtaining and/or reviewing separately obtained history,  performing a medically appropriate examination or evaluation, counseling and educating the patient, ordering medications, test or procedures, documenting clinical information in the electronic or other health care record, and independently interpreting results and communicating results to the patient, family, or caregiver   I have reviewed the above documentation for accuracy and completeness, and I agree with the above. Barnie JINNY Jenkins, D.O.  The 21st Century Cures Act was signed into law in 2016 which includes the topic of electronic health records.  This provides immediate access to information in MyChart.  This includes consultation notes, operative notes, office notes, lab results and pathology reports.  If you have any questions about what you read please let us  know at your next visit so we can discuss your concerns and take corrective action if need be.  We are right here with you.

## 2023-11-09 ENCOUNTER — Ambulatory Visit (INDEPENDENT_AMBULATORY_CARE_PROVIDER_SITE_OTHER): Admitting: Family Medicine

## 2023-11-09 ENCOUNTER — Encounter (INDEPENDENT_AMBULATORY_CARE_PROVIDER_SITE_OTHER): Payer: Self-pay | Admitting: Family Medicine

## 2023-11-09 VITALS — BP 138/80 | HR 84 | Temp 97.8°F | Ht 65.0 in | Wt 238.0 lb

## 2023-11-09 DIAGNOSIS — E7849 Other hyperlipidemia: Secondary | ICD-10-CM | POA: Diagnosis not present

## 2023-11-09 DIAGNOSIS — Z6839 Body mass index (BMI) 39.0-39.9, adult: Secondary | ICD-10-CM

## 2023-11-09 DIAGNOSIS — E559 Vitamin D deficiency, unspecified: Secondary | ICD-10-CM

## 2023-11-09 DIAGNOSIS — R7303 Prediabetes: Secondary | ICD-10-CM | POA: Diagnosis not present

## 2023-11-09 NOTE — Progress Notes (Signed)
 Amber DOROTHA Parrish, D.O.  ABFM, ABOM Specializing in Clinical Bariatric Medicine  Office located at: [address] [place], [place]  [address]   Assessment and Plan:   Orders Placed This Encounter  Procedures   VITAMIN D  25 Hydroxy (Vit-D Deficiency, Fractures)   Hemoglobin A1c   Comprehensive metabolic panel with GFR   LDL cholesterol, direct   Lipid panel   CBC with Differential/Platelet    Pt will obtain the labs ordered today 3-4 days prior to next OV   FOR THE DISEASE OF OBESITY:  BMI 39.0-39.9,adult current 39.61 Morbid obesity (HCC)-start bmi 42.23/start 04/24/21 Assessment & Plan: Since last office visit on 10/19/2023 patient's muscle mass has decreased by 4.4 lbs. Fat mass has decreased by 4.8 lbs. Total body water has decreased by 0.2 lbs.  Counseling done on how various foods will affect these numbers and how to maximize success  Total lbs lost to date: -8  lbs Total weight loss percentage to date: -3.25 %   Recommended Dietary Goals Amber Parrish is currently in the action stage of change. As such, her goal is to continue weight management plan.  She has agreed to: continue current plan   Behavioral Intervention We discussed the following today: increasing lean protein intake to established goals, work on meal planning and preparation, work on tracking and journaling calories using tracking application, continue to work on implementation of reduced calorie nutritional plan, and continue to practice mindfulness when eating  Additional resources provided today: Handout on Daily Food Journaling Log  Evidence-based interventions for health behavior change were utilized today including the discussion of self monitoring techniques, problem-solving barriers and SMART goal setting techniques.   Regarding patient's less desirable eating habits and patterns, we employed the technique of small changes.   Pt will work on making her journaling/eating a priority.     Recommended Physical Activity Goals Amber Parrish has been advised to work up to 300-450 minutes of moderate intensity aerobic activity a week and strengthening exercises 2-3 times per week for cardiovascular health, weight loss maintenance and preservation of muscle mass.   She has agreed to: continue to gradually increase the amount and intensity of exercise routine   Pharmacotherapy Continue same regimen.    ASSOCIATED CONDITIONS ADDRESSED TODAY:   Other hyperlipidemia Assessment & Plan: Lab Results  Component Value Date   CHOL 186 01/15/2022   HDL 70 01/15/2022   LDLCALC 102 (H) 01/15/2022   TRIG 78 01/15/2022   CHOLHDL 3.4 11/15/2019   Managed with diet and life-style interventions. Most recent lipid panel on 01/15/2022 demonstrated mildly elevated LDL of 102.   - Continue to maintain a diet low in saturated and trans fats.  - Order lipid panel.    Pre-diabetes Assessment & Plan: Lab Results  Component Value Date   HGBA1C 5.8 (H) 02/25/2023   HGBA1C 6.0 (H) 11/05/2022   HGBA1C 5.5 05/14/2022   INSULIN  12.8 02/25/2023   INSULIN  13.1 11/05/2022   INSULIN  15.0 01/15/2022    Pre-DM treated with Metformin  500 mg 2 tablets with lunch and 1 tablet with dinner. Denies gastrointestinal issues. Hunger and cravings are stable when eating on plan.   - Continue Metformin  therapy.  - Continue working on nutrition plan to decrease simple carbohydrates, increase lean proteins and exercise to promote weight loss and improve glycemic control and prevent progression to T2DM. - Order labs today.     Vitamin D  deficiency Assessment & Plan: Lab Results  Component Value Date   VD25OH 40.3  02/25/2023   VD25OH 44.4 11/05/2022   VD25OH 48.5 05/14/2022   Pt reports good compliance and tolerance of Ergocalciferol  50,000 units twice weekly. No acute concerns.    - Continue vitamin D  supplementation. - weight loss will likely improve availability of vitamin D , thus encouraged  Amber Parrish to continue with meal plan and their weight loss efforts to further improve this condition.  - Order labs today.    Follow up:   Return 12/02/2023 at 3:40 PM.  She was informed of the importance of frequent follow up visits to maximize her success with intensive lifestyle modifications for her multiple health conditions.  Amber Parrish is aware that we will review all of her lab results at our next visit together in person.  She is aware that if anything is critical/ life threatening with the results, we will be contacting her via MyChart or by my CMA will be calling them prior to the office visit to discuss acute management.     Subjective:   Chief complaint: Obesity Amber Parrish is here to discuss her progress with her obesity treatment plan. She is keeping a food journal and adhering to recommended goals of 1550-1650 calories and 110+ g protein and states she is following her eating plan approximately 85% of the time.   Interval History:  Amber Parrish is here for a follow up office visit. Since last OV on 10/19/2023 , her weight has not changed. She admits to not journaling her intake consistently; she often forgets too.  She states she is walking 20-40 minutes 7 days per week.     Pharmacotherapy that aid with weight loss: She is currently taking Metformin  500 mg 2 tablets with lunch and 1 tablet with dinner.   Review of Systems:  Pertinent positives were addressed with patient today.  Reviewed by clinician on day of visit: allergies, medications, problem list, medical history, surgical history, family history, social history, and previous encounter notes.   Weight Summary and Biometrics   Weight Lost Since Last Visit: 0lb  Weight Gained Since Last Visit: 0lb   Vitals Temp: 97.8 F (36.6 C) BP: 138/80 Pulse Rate: 84 SpO2: 97 %   Anthropometric Measurements Height: 5' 5 (1.651 m) Weight: 238 lb (108 kg) BMI (Calculated): 39.61 Weight at Last Visit:  238lb Weight Lost Since Last Visit: 0lb Weight Gained Since Last Visit: 0lb Starting Weight: 246lb Total Weight Loss (lbs): 8 lb (3.629 kg)   Body Composition  Body Fat %: 44.6 % Fat Mass (lbs): 106.6 lbs Muscle Mass (lbs): 125.6 lbs Total Body Water (lbs): 93.2 lbs Visceral Fat Rating : 13   Other Clinical Data Fasting: No Labs: Yes Today's Visit #: 27 Starting Date: 04/24/21 Comments: j/1550-1650 110+    Objective:   PHYSICAL EXAM: Blood pressure 138/80, pulse 84, temperature 97.8 F (36.6 C), height 5' 5 (1.651 m), weight 238 lb (108 kg), SpO2 97%. Body mass index is 39.61 kg/m.  General: she is overweight, cooperative and in no acute distress. PSYCH: Has normal mood, affect and thought process.   HEENT: EOMI, sclerae are anicteric. Lungs: Normal breathing effort, no conversational dyspnea. Extremities: Moves * 4 Neurologic: A and O * 3, good insight  DIAGNOSTIC DATA REVIEWED: BMET    Component Value Date/Time   NA 140 11/05/2022 1449   K 4.8 11/05/2022 1449   CL 105 11/05/2022 1449   CO2 21 11/05/2022 1449   GLUCOSE 95 11/05/2022 1449   GLUCOSE 82 01/26/2011 1450   BUN 20 11/05/2022  1449   CREATININE 0.80 11/05/2022 1449   CREATININE 0.61 01/26/2011 1450   CALCIUM 10.1 11/05/2022 1449   GFRNONAA 106 07/03/2019 1156   GFRAA 122 07/03/2019 1156   Lab Results  Component Value Date   HGBA1C 5.8 (H) 02/25/2023   HGBA1C 5.7 (H) 07/03/2019   Lab Results  Component Value Date   INSULIN  12.8 02/25/2023   INSULIN  16.7 07/03/2019   Lab Results  Component Value Date   TSH 1.120 04/24/2021   CBC    Component Value Date/Time   WBC 4.4 04/24/2021 0906   WBC 5.8 01/26/2011 1450   RBC 4.63 04/24/2021 0906   RBC 4.17 01/26/2011 1450   HGB 13.5 04/24/2021 0906   HCT 41.5 04/24/2021 0906   PLT 270 04/24/2021 0906   MCV 90 04/24/2021 0906   MCH 29.2 04/24/2021 0906   MCH 30.5 01/26/2011 1450   MCHC 32.5 04/24/2021 0906   MCHC 33.1 01/26/2011 1450    RDW 13.1 04/24/2021 0906   Iron Studies No results found for: IRON, TIBC, FERRITIN, IRONPCTSAT Lipid Panel     Component Value Date/Time   CHOL 186 01/15/2022 0855   TRIG 78 01/15/2022 0855   HDL 70 01/15/2022 0855   CHOLHDL 3.4 11/15/2019 1124   LDLCALC 102 (H) 01/15/2022 0855   Hepatic Function Panel     Component Value Date/Time   PROT 7.6 11/05/2022 1449   ALBUMIN 4.9 11/05/2022 1449   AST 19 11/05/2022 1449   ALT 15 11/05/2022 1449   ALKPHOS 87 11/05/2022 1449   BILITOT 0.5 11/05/2022 1449      Component Value Date/Time   TSH 1.120 04/24/2021 0906   Nutritional Lab Results  Component Value Date   VD25OH 40.3 02/25/2023   VD25OH 44.4 11/05/2022   VD25OH 48.5 05/14/2022    Attestations:   I, Special Puri, acting as a Stage manager for [employer], DO., have compiled all relevant documentation for today's office visit on behalf of Amber Jenkins, DO, while in the presence of [employer], DO.  I have reviewed the above documentation for accuracy and completeness, and I agree with the above. Amber Parrish, D.O.  The 21st Century Cures Act was signed into law in 2016 which includes the topic of electronic health records.  This provides immediate access to information in MyChart. This includes consultation notes, operative notes, office notes, lab results and pathology reports.  If you have any questions about what you read please let us  know at your next visit so we can discuss your concerns and take corrective action if need be.  We are right here with you.

## 2023-11-22 ENCOUNTER — Encounter (INDEPENDENT_AMBULATORY_CARE_PROVIDER_SITE_OTHER): Payer: Self-pay

## 2023-12-02 ENCOUNTER — Ambulatory Visit (INDEPENDENT_AMBULATORY_CARE_PROVIDER_SITE_OTHER): Admitting: Family Medicine

## 2023-12-23 ENCOUNTER — Ambulatory Visit (INDEPENDENT_AMBULATORY_CARE_PROVIDER_SITE_OTHER): Admitting: Family Medicine

## 2024-01-05 ENCOUNTER — Ambulatory Visit (INDEPENDENT_AMBULATORY_CARE_PROVIDER_SITE_OTHER): Admitting: Family Medicine

## 2024-01-05 ENCOUNTER — Encounter (INDEPENDENT_AMBULATORY_CARE_PROVIDER_SITE_OTHER): Payer: Self-pay | Admitting: Family Medicine

## 2024-01-05 DIAGNOSIS — Z6839 Body mass index (BMI) 39.0-39.9, adult: Secondary | ICD-10-CM

## 2024-01-05 DIAGNOSIS — J3089 Other allergic rhinitis: Secondary | ICD-10-CM | POA: Diagnosis not present

## 2024-01-05 DIAGNOSIS — E7849 Other hyperlipidemia: Secondary | ICD-10-CM | POA: Diagnosis not present

## 2024-01-05 DIAGNOSIS — R7303 Prediabetes: Secondary | ICD-10-CM

## 2024-01-05 DIAGNOSIS — J302 Other seasonal allergic rhinitis: Secondary | ICD-10-CM

## 2024-01-05 DIAGNOSIS — E559 Vitamin D deficiency, unspecified: Secondary | ICD-10-CM

## 2024-01-05 MED ORDER — LEVOCETIRIZINE DIHYDROCHLORIDE 5 MG PO TABS: 5.0000 mg | ORAL_TABLET | Freq: Every evening | ORAL | 0 refills | Status: AC

## 2024-01-05 MED ORDER — MONTELUKAST SODIUM 10 MG PO TABS: 10.0000 mg | ORAL_TABLET | Freq: Every day | ORAL | 0 refills | Status: AC

## 2024-01-05 MED ORDER — VITAMIN D (ERGOCALCIFEROL) 1.25 MG (50000 UNIT) PO CAPS: ORAL_CAPSULE | ORAL | 0 refills | Status: AC

## 2024-01-05 MED ORDER — METFORMIN HCL 500 MG PO TABS: ORAL_TABLET | ORAL | 0 refills | Status: AC

## 2024-01-05 NOTE — Progress Notes (Signed)
 Amber Parrish, D.O.  ABFM, ABOM Specializing in Clinical Bariatric Medicine  Office located at: [address] [place], [place]  [address]   Assessment and Plan:   Medications Discontinued During This Encounter  Medication Reason   metFORMIN  (GLUCOPHAGE ) 500 MG tablet Reorder   montelukast  (SINGULAIR ) 10 MG tablet Reorder   Vitamin D , Ergocalciferol , (DRISDOL ) 1.25 MG (50000 UNIT) CAPS capsule Reorder   levocetirizine (XYZAL ) 5 MG tablet Reorder    Meds ordered this encounter  Medications   metFORMIN  (GLUCOPHAGE ) 500 MG tablet    Sig: 2 po with lunch and 1 with dinner daily    Dispense:  270 tablet    Refill:  0    90 d supply;  ** OV for RF **   Do not send RF request   levocetirizine (XYZAL ) 5 MG tablet    Sig: Take 1 tablet (5 mg total) by mouth every evening.    Dispense:  90 tablet    Refill:  0   montelukast  (SINGULAIR ) 10 MG tablet    Sig: Take 1 tablet (10 mg total) by mouth at bedtime.    Dispense:  90 tablet    Refill:  0   Vitamin D , Ergocalciferol , (DRISDOL ) 1.25 MG (50000 UNIT) CAPS capsule    Sig: 1 po q Mon and 1 po q Thurs    Dispense:  24 capsule    Refill:  0    FOR THE DISEASE OF OBESITY:  Morbid obesity (HCC)-start bmi 42.23/start 04/24/21; BMI 39.0-39.9,adult current 39.27 Assessment & Plan:  Since last office visit on 11/09/2023 patient's muscle mass has increased by 8.2 lbs. Fat mass has decreased by 11.4 lbs. Total body water has decreased by 2 lbs.  Body fat % has decreased by 4.3 %. Counseling done on how various foods will affect these numbers and how to maximize success.  Total lbs lost to date: 10 lbs  Total weight loss percentage to date: 4.07 %  - Tracking Calories/Macros: yes, using and app and journal 60-70% of the time  - Eating More Whole Foods: no  - Adequate Protein Intake: yes  - Adequate Water Intake: no  - Skipping Meals: yes  - Sleeping 7-9 Hours/ Night: no   Recommended Dietary Goals Amber Parrish is currently in  the action stage of change. As such, her goal is to continue weight management plan.  She has agreed to: switch to tracking calories with a guide of 1400-1500 calories, 90+ grams of protein daily   Behavioral Intervention We discussed the following today: avoiding skipping meals, work on tracking and journaling calories using tracking application, work on managing stress, creating time for self-care and relaxation, continue to practice mindfulness when eating, and continue to work on maintaining a reduced calorie state, getting the recommended amount of protein, incorporating whole foods, making healthy choices, staying well hydrated, the concept of pay to play, and practicing mindfulness when eating.  Additional resources provided today: None  Evidence-based interventions for health behavior change were utilized today including the discussion of self monitoring techniques, problem-solving barriers and SMART goal setting techniques.   Regarding patient's less desirable eating habits and patterns, we employed the technique of small changes.   Goal(s) for next OV: work on making her journaling/eating a priority - journal physical in a note book, mark days you do not fully track calories    Recommended Physical Activity Goals Amber Parrish has been advised to work up to 300-450 minutes of moderate intensity aerobic activity a week  and strengthening exercises 2-3 times per week for cardiovascular health, weight loss maintenance and preservation of muscle mass.   She has agreed to: Think about enjoyable ways to increase daily physical activity and overcoming barriers to exercise, Increase physical activity in their day and reduce sedentary time (increase NEAT)., Increase volume of physical activity to a goal of 240 minutes a week, and Combine aerobic and strengthening exercises for efficiency and improved cardiometabolic health.   Pharmacotherapy We both agreed to: Was educated on available treatment  options, including pharmacotherapy with emphasis on potential side effects with consideration that in the past it may have been denied by insurance and is cost prohibitive, and other available medical interventions, and continue Metformin  500 mg TID. Recommend checking Amber Parrish for direct pay options.   ASSOCIATED CONDITIONS ADDRESSED TODAY:  Pre-diabetes Assessment & Plan: Goal is HgbA1c < 5.7, fasting insulin  around 5. It has almost been a year since her last HgbA1c & FI - both were elevated with HgbA1c barely out of normal limits and insulin  about 2x the limit.  Medication: Metformin  500 mg TID (2 tablets PO w/ lunch & 1 tablet PO w/ dinner) daily.     Lab Results  Component Value Date   HGBA1C 5.8 (H) 02/25/2023   HGBA1C 6.0 (H) 11/05/2022   HGBA1C 5.5 05/14/2022   INSULIN  12.8 02/25/2023   INSULIN  13.1 11/05/2022   INSULIN  15.0 01/15/2022  Stressed importance of dietary and lifestyle modifications to result in weight loss as first line txmnt.  In addition, we discussed the risks and benefits of various medication options which can help us  in the management of this disease process as well as with weight loss.   Continue to decrease simple carbs/ sugars; increase fiber and proteins -> follow her meal plan. Explained role of simple carbs and insulin  levels on hunger and cravings We will recheck A1c and fasting insulin  level today.  Refilling Metformin  today.     Vitamin D  deficiency Assessment & Plan: Goal is 50-70, with her last levels in November 2024 being 10 points short of minimum limit. She does take Vitamin-D 50,000 bi-weekly.  Lab Results  Component Value Date   VD25OH 40.3 02/25/2023   VD25OH 44.4 11/05/2022   VD25OH 48.5 05/14/2022  Informed patient this may be a lifelong thing, and she was encouraged to continue to take the medicine until told otherwise. Weight loss will likely improve availability of vitamin D , thus encouraged Amber Parrish to continue with meal plan and  their weight loss efforts to further improve this condition.   We will recheck Vitamin-D levels today.    Other hyperlipidemia Assessment & Plan: Lipid panel in 2023 was almost completely WNL, with only the LDL being 2 points above goal of =/<100. She is not on any cholesterol lowering medication at this time. Lab Results  Component Value Date   CHOL 186 01/15/2022   HDL 70 01/15/2022   LDLCALC 102 (H) 01/15/2022   TRIG 78 01/15/2022   CHOLHDL 3.4 11/15/2019  Amber Parrish agrees to continue with meds and/or our treatment plan of a heart-heathy, low cholesterol meal plan. I stressed the importance that patient continue with our prudent nutritional plan that is low in saturated and trans fats, and low in fatty carbs to improve these numbers.  We extensively discussed several lifestyle modifications today and she will continue to work on diet, exercise and weight loss efforts.  We recommend: aerobic activity with eventual goal of a minimum of 150+ min wk plus 2  days/ week of resistance or strength training.   Repeating lipid panel with direct LDL today.     Environmental and seasonal allergies She manages these with Singulair  10 mg daily and Xyzal  5 mg daily, which she endorses compliance with and says her symptoms have been well-controlled. Advised she continue to take medications as prescribed. Refilling both Singulair  and Xyzal  today.   Follow up:   Return 01/25/2024 @ 2:00 PM. She was informed of the importance of frequent follow up visits to maximize her success with intensive lifestyle modifications for her multiple health conditions.  Amber Parrish is aware that we will review all of her lab results at our next visit together in person.  She is aware that if anything is critical/ life threatening with the results, we will be contacting her via MyChart or by my CMA will be calling them prior to the office visit to discuss acute management.    Subjective:    Chief  complaint: Obesity Rafael is here to discuss her progress with her obesity treatment plan. She is keeping a food journal and adhering to recommended goals of 1550-1650 calories and 110+ g protein and states she is following her eating plan approximately 50% of the time. Pt is walking or playing volleyball 45-90 minutes 5-7 days per week    Interval History:  Amber Parrish is here for a follow up office visit. Pt has experienced a weight loss of 2 LBS since last OV on 11/09/2023. She admits that she has not been as compliant with her meal plan due to recent life stressors, but did increase her exercise.    Pharmacotherapy that aid with weight loss: She is currently taking Metformin  500 mg TID (2 tablets PO w/ lunch & 1 tablet PO w/ dinner) daily with adequate clinical response and without side effects.    Review of Systems:  Pertinent positives were addressed with patient today.  Reviewed by clinician on day of visit: allergies, medications, problem list, medical history, surgical history, family history, social history, and previous encounter notes.  Weight Summary and Biometrics   Weight Lost Since Last Visit: 2lb  Weight Gained Since Last Visit: 0lb   Vitals Temp: 97.6 F (36.4 C) BP: 129/83 Pulse Rate: 69 SpO2: 99 %   Anthropometric Measurements Height: 5' 5 (1.651 m) Weight: 236 lb (107 kg) BMI (Calculated): 39.27 Weight at Last Visit: 238lb Weight Lost Since Last Visit: 2lb Weight Gained Since Last Visit: 0lb Starting Weight: 246lb Total Weight Loss (lbs): 10 lb (4.536 kg)   Body Composition  Body Fat %: 40.3 % Fat Mass (lbs): 95.2 lbs Muscle Mass (lbs): 133.8 lbs Total Body Water (lbs): 91.2 lbs Visceral Fat Rating : 12   Other Clinical Data Fasting: no Labs: No Today's Visit #: 28 Starting Date: 04/24/21   Objective:   PHYSICAL EXAM: Blood pressure 129/83, pulse 69, temperature 97.6 F (36.4 C), height 5' 5 (1.651 m), weight 236 lb (107 kg),  last menstrual period 12/29/2023, SpO2 99%. Body mass index is 39.27 kg/m.  General: she is overweight, cooperative and in no acute distress. PSYCH: Has normal mood, affect and thought process.   HEENT: EOMI, sclerae are anicteric. Lungs: Normal breathing effort, no conversational dyspnea. Extremities: Moves * 4 Neurologic: A and O * 3, good insight  DIAGNOSTIC DATA REVIEWED: BMET    Component Value Date/Time   NA 140 11/05/2022 1449   K 4.8 11/05/2022 1449   CL 105 11/05/2022 1449   CO2 21 11/05/2022  1449   GLUCOSE 95 11/05/2022 1449   GLUCOSE 82 01/26/2011 1450   BUN 20 11/05/2022 1449   CREATININE 0.80 11/05/2022 1449   CREATININE 0.61 01/26/2011 1450   CALCIUM 10.1 11/05/2022 1449   GFRNONAA 106 07/03/2019 1156   GFRAA 122 07/03/2019 1156   Lab Results  Component Value Date   HGBA1C 5.8 (H) 02/25/2023   HGBA1C 5.7 (H) 07/03/2019   Lab Results  Component Value Date   INSULIN  12.8 02/25/2023   INSULIN  16.7 07/03/2019   Lab Results  Component Value Date   TSH 1.120 04/24/2021   CBC    Component Value Date/Time   WBC 4.4 04/24/2021 0906   WBC 5.8 01/26/2011 1450   RBC 4.63 04/24/2021 0906   RBC 4.17 01/26/2011 1450   HGB 13.5 04/24/2021 0906   HCT 41.5 04/24/2021 0906   PLT 270 04/24/2021 0906   MCV 90 04/24/2021 0906   MCH 29.2 04/24/2021 0906   MCH 30.5 01/26/2011 1450   MCHC 32.5 04/24/2021 0906   MCHC 33.1 01/26/2011 1450   RDW 13.1 04/24/2021 0906   Iron Studies No results found for: IRON, TIBC, FERRITIN, IRONPCTSAT Lipid Panel     Component Value Date/Time   CHOL 186 01/15/2022 0855   TRIG 78 01/15/2022 0855   HDL 70 01/15/2022 0855   CHOLHDL 3.4 11/15/2019 1124   LDLCALC 102 (H) 01/15/2022 0855   Hepatic Function Panel     Component Value Date/Time   PROT 7.6 11/05/2022 1449   ALBUMIN 4.9 11/05/2022 1449   AST 19 11/05/2022 1449   ALT 15 11/05/2022 1449   ALKPHOS 87 11/05/2022 1449   BILITOT 0.5 11/05/2022 1449       Component Value Date/Time   TSH 1.120 04/24/2021 0906   Nutritional Lab Results  Component Value Date   VD25OH 40.3 02/25/2023   VD25OH 44.4 11/05/2022   VD25OH 48.5 05/14/2022    Attestations:   I, Damien Blanks, acting as a Stage manager for Amber Jenkins, DO., have compiled all relevant documentation for today's office visit on behalf of Amber Jenkins, DO, while in the presence of [employer], DO.  I have reviewed the above documentation for accuracy and completeness, and I agree with the above. Amber JINNY Parrish, D.O.  The 21st Century Cures Act was signed into law in 2016 which includes the topic of electronic health records.  This provides immediate access to information in MyChart.  This includes consultation notes, operative notes, office notes, lab results and pathology reports.  If you have any questions about what you read please let us  know at your next visit so we can discuss your concerns and take corrective action if need be.  We are right here with you.

## 2024-01-06 LAB — COMPREHENSIVE METABOLIC PANEL WITH GFR
ALT: 21 IU/L (ref 0–32)
AST: 27 IU/L (ref 0–40)
Albumin: 4.9 g/dL (ref 3.8–4.9)
Alkaline Phosphatase: 100 IU/L (ref 49–135)
BUN/Creatinine Ratio: 22 (ref 9–23)
BUN: 20 mg/dL (ref 6–24)
Bilirubin Total: 0.7 mg/dL (ref 0.0–1.2)
CO2: 24 mmol/L (ref 20–29)
Calcium: 10.6 mg/dL — ABNORMAL HIGH (ref 8.7–10.2)
Chloride: 101 mmol/L (ref 96–106)
Creatinine, Ser: 0.93 mg/dL (ref 0.57–1.00)
Globulin, Total: 2.5 g/dL (ref 1.5–4.5)
Glucose: 88 mg/dL (ref 70–99)
Potassium: 4.6 mmol/L (ref 3.5–5.2)
Sodium: 139 mmol/L (ref 134–144)
Total Protein: 7.4 g/dL (ref 6.0–8.5)
eGFR: 74 mL/min/1.73 (ref >59)

## 2024-01-06 LAB — HEMOGLOBIN A1C
Est. average glucose Bld gHb Est-mCnc: 114 mg/dL
Hgb A1c MFr Bld: 5.6 % (ref 4.8–5.6)

## 2024-01-06 LAB — CBC WITH DIFFERENTIAL/PLATELET
Basophils Absolute: 0.1 x10E3/uL (ref 0.0–0.2)
Basos: 1 %
EOS (ABSOLUTE): 0.2 x10E3/uL (ref 0.0–0.4)
Eos: 3 %
Hematocrit: 40.4 % (ref 34.0–46.6)
Hemoglobin: 13 g/dL (ref 11.1–15.9)
Immature Grans (Abs): 0 x10E3/uL (ref 0.0–0.1)
Immature Granulocytes: 0 %
Lymphocytes Absolute: 1.4 x10E3/uL (ref 0.7–3.1)
Lymphs: 27 %
MCH: 29.7 pg (ref 26.6–33.0)
MCHC: 32.2 g/dL (ref 31.5–35.7)
MCV: 92 fL (ref 79–97)
Monocytes Absolute: 0.5 x10E3/uL (ref 0.1–0.9)
Monocytes: 9 %
Neutrophils Absolute: 3.1 x10E3/uL (ref 1.4–7.0)
Neutrophils: 60 %
Platelets: 273 x10E3/uL (ref 150–450)
RBC: 4.38 x10E6/uL (ref 3.77–5.28)
RDW: 13.8 % (ref 11.7–15.4)
WBC: 5.2 x10E3/uL (ref 3.4–10.8)

## 2024-01-06 LAB — LIPID PANEL
Chol/HDL Ratio: 2.3 ratio (ref 0.0–4.4)
Cholesterol, Total: 196 mg/dL (ref 100–199)
HDL: 85 mg/dL (ref >39)
LDL Chol Calc (NIH): 97 mg/dL (ref 0–99)
Triglycerides: 78 mg/dL (ref 0–149)
VLDL Cholesterol Cal: 14 mg/dL (ref 5–40)

## 2024-01-06 LAB — VITAMIN D 25 HYDROXY (VIT D DEFICIENCY, FRACTURES): Vit D, 25-Hydroxy: 51.5 ng/mL (ref 30.0–100.0)

## 2024-01-06 LAB — INSULIN, RANDOM: INSULIN: 9.7 u[IU]/mL (ref 2.6–24.9)

## 2024-01-06 LAB — LDL CHOLESTEROL, DIRECT: LDL Direct: 101 mg/dL — ABNORMAL HIGH (ref 0–99)

## 2024-01-13 ENCOUNTER — Other Ambulatory Visit: Payer: Self-pay | Admitting: Neurology

## 2024-01-13 ENCOUNTER — Telehealth: Payer: Self-pay | Admitting: Pharmacy Technician

## 2024-01-13 ENCOUNTER — Other Ambulatory Visit (HOSPITAL_COMMUNITY): Payer: Self-pay

## 2024-01-13 NOTE — Telephone Encounter (Signed)
 PA has been submitted, and telephone encounter has been created. Please see telephone encounter dated 9.25.25.

## 2024-01-13 NOTE — Telephone Encounter (Signed)
 Pharmacy Patient Advocate Encounter   Received notification from Pt Calls Messages that prior authorization for QULIPTA  60MG  is required/requested.   Insurance verification completed.   The patient is insured through [facility] .   Per test claim: PA required; PA submitted to above mentioned insurance via Prompt PA Key/confirmation #/EOC [id number] Status is pending

## 2024-01-14 MED ORDER — QULIPTA 60 MG PO TABS: 1.0000 | ORAL_TABLET | Freq: Every day | ORAL | 2 refills | Status: DC

## 2024-01-14 NOTE — Addendum Note (Signed)
 Addended by: OZELL JESUSA PARAS on: 01/14/2024 01:08 PM   Modules accepted: Orders

## 2024-01-14 NOTE — Telephone Encounter (Signed)
 Pt has active PA. Must fill with mail order in order to get 90 days supply.

## 2024-01-14 NOTE — Telephone Encounter (Signed)
 Pharmacy Patient Advocate Encounter  Received notification from [employer] that Prior Authorization for QULIPTA  60MG  has been CANCELLED due to   PA #/Case ID/Reference #: [id number]

## 2024-01-14 NOTE — Telephone Encounter (Signed)
 Tried calling patient no answer. Myhcart message sent.

## 2024-01-20 ENCOUNTER — Other Ambulatory Visit (INDEPENDENT_AMBULATORY_CARE_PROVIDER_SITE_OTHER): Payer: Self-pay | Admitting: Family Medicine

## 2024-01-20 DIAGNOSIS — E559 Vitamin D deficiency, unspecified: Secondary | ICD-10-CM

## 2024-01-25 ENCOUNTER — Encounter (INDEPENDENT_AMBULATORY_CARE_PROVIDER_SITE_OTHER): Payer: Self-pay | Admitting: Family Medicine

## 2024-01-25 ENCOUNTER — Ambulatory Visit (INDEPENDENT_AMBULATORY_CARE_PROVIDER_SITE_OTHER): Admitting: Family Medicine

## 2024-01-25 DIAGNOSIS — E559 Vitamin D deficiency, unspecified: Secondary | ICD-10-CM | POA: Diagnosis not present

## 2024-01-25 DIAGNOSIS — Z6839 Body mass index (BMI) 39.0-39.9, adult: Secondary | ICD-10-CM

## 2024-01-25 DIAGNOSIS — R030 Elevated blood-pressure reading, without diagnosis of hypertension: Secondary | ICD-10-CM

## 2024-01-25 DIAGNOSIS — R7303 Prediabetes: Secondary | ICD-10-CM | POA: Diagnosis not present

## 2024-01-25 DIAGNOSIS — E7849 Other hyperlipidemia: Secondary | ICD-10-CM | POA: Diagnosis not present

## 2024-01-25 DIAGNOSIS — F39 Unspecified mood [affective] disorder: Secondary | ICD-10-CM | POA: Diagnosis not present

## 2024-01-25 DIAGNOSIS — F5089 Other specified eating disorder: Secondary | ICD-10-CM

## 2024-01-25 NOTE — Progress Notes (Signed)
 Amber Parrish, D.O.  ABFM, ABOM Specializing in Clinical Bariatric Medicine  Office located at: [address] [place], [place]  [address]      A) FOR THE CHRONIC DISEASE OF OBESITY:  Chief complaint: Obesity Amber Parrish is here to discuss her progress with her obesity treatment plan.   History of present illness / Interval history:  Amber Parrish is here today for her follow-up office visit.  Since last OV on 01/05/24, pt is up 1 lb. Pt states that she is aware of when she is skipping meals because she is journaling. She endorses that the weekends are a little hard due to her schedule and she forgets to journal. Pt states that when she notices she doesn't eat she will go back and eat something and will sometimes drink a little too much and the calories add up.       01/05/24 10:00 01/25/24   Body Fat % 40.3 % 44.7 %  Muscle Mass (lbs) 133.8 lbs 125 lbs  Fat Mass (lbs) 95.2 lbs 106.2 lbs  Total Body Water (lbs) 91.2 lbs 90.8 lbs  Visceral Fat Rating  12 13   Counseling done on how various foods will affect these numbers and how to maximize success   Total lbs lost to date: - 9 lbs Total Fat Mass in lbs lost to date: + 7.2 lbs Total weight loss percentage to date: - 3.66 %    Morbid obesity (HCC)-start bmi 42.23/start 04/24/21 BMI 39.0-39.9,adult current 39.44  Nutrition Therapy She is tracking calories with a guide of 1400-1500 calories, 90+ grams of protein daily and states she is following her eating plan approximately 80 % of the time.   - Tracking Calories/Macros: no   - Eating More Whole Foods: yes  - Adequate Protein Intake: yes  - Adequate Water Intake: no   - Skipping Meals: yes  - Sleeping 7-9 Hours/ Night: no    [name] is currently in the action stage of change. As such, her goal is to continue weight management plan.  She has agreed to: continue current plan   Physical Activity Pt is walking 30-60 minutes 5 - 7 days per week   [name]  has been advised to work up to 300-450 minutes of moderate intensity aerobic activity a week and strengthening exercises 2-3 times per week for cardiovascular health, weight loss maintenance and preservation of muscle mass.  She has agreed to : Think about enjoyable ways to increase daily physical activity and overcoming barriers to exercise, Increase physical activity in their day and reduce sedentary time (increase NEAT)., and Increase volume of physical activity to a goal of 240 minutes a week   Behavioral Modifications Evidence-based interventions for health behavior change were utilized today including the discussion of  1) self monitoring techniques:    - Be consistent  2) problem-solving barriers:    - Journal   3) self care:    -Avoid judging yourself   4) SMART goals for next OV:    - Journal calories and grams of protein  Regarding patient's less desirable eating habits and patterns, we employed the technique of small changes.   We discussed the following today: increasing lean protein intake to established goals, avoiding skipping meals, and better snacking choices Additional resources provided today: Handout on Daily Food Journaling Log   Medical Interventions/ Pharmacotherapy Previous Bariatric surgery: n/a Pharmacotherapy for weight loss: She is currently taking metformin  2 tablets at lunch and 1 with dinner for medical  weight loss.    We discussed various medication options to help [name] with her weight loss efforts and we both agreed to : Continue with current nutritional and behavioral strategies   B) OBESITY RELATED CONDITIONS ADDRESSED TODAY:  Pre-diabetes Assessment & Plan Lab Results  Component Value Date   HGBA1C 5.6 01/05/2024   HGBA1C 5.8 (H) 02/25/2023   HGBA1C 6.0 (H) 11/05/2022   INSULIN  9.7 01/05/2024   INSULIN  12.8 02/25/2023   INSULIN  13.1 11/05/2022      Component Value Date/Time   NA 139 01/05/2024 1208   K 4.6 01/05/2024 1208   CL 101  01/05/2024 1208   CO2 24 01/05/2024 1208   GLUCOSE 88 01/05/2024 1208   GLUCOSE 82 01/26/2011 1450   BUN 20 01/05/2024 1208   CREATININE 0.93 01/05/2024 1208   CREATININE 0.61 01/26/2011 1450   CALCIUM 10.6 (H) 01/05/2024 1208   PROT 7.4 01/05/2024 1208   ALBUMIN 4.9 01/05/2024 1208   AST 27 01/05/2024 1208   ALT 21 01/05/2024 1208   ALKPHOS 100 01/05/2024 1208   BILITOT 0.7 01/05/2024 1208   EGFR 74 01/05/2024 1208   GFRNONAA 106 07/03/2019 1156   CBC    Component Value Date/Time   WBC 5.2 01/05/2024 1208   WBC 5.8 01/26/2011 1450   RBC 4.38 01/05/2024 1208   RBC 4.17 01/26/2011 1450   HGB 13.0 01/05/2024 1208   HCT 40.4 01/05/2024 1208   PLT 273 01/05/2024 1208   MCV 92 01/05/2024 1208   MCH 29.7 01/05/2024 1208   MCH 30.5 01/26/2011 1450   MCHC 32.2 01/05/2024 1208   MCHC 33.1 01/26/2011 1450   RDW 13.8 01/05/2024 1208   LYMPHSABS 1.4 01/05/2024 1208   MONOABS 0.5 01/26/2011 1450   EOSABS 0.2 01/05/2024 1208   BASOSABS 0.1 01/05/2024 1208   On Metformin  2 tablets with lunch and 1 tablet with dinner daily. With good compliance and tolerance. Pt states that she has good control of hunger and cravings. Pts insulin  level decreased from 12 to 9.7. Insulin  levels should be less than 5. A1c levels also decreased from 5.8 to 5.6. Continue to decrease simple carbs/ sugars; increase fiber and proteins -> follow her meal plan.   Kidney function panels show that pt is a little dehydrated but other than that panels are WNL.  Follow up with PCP to recheck calcium levels in 3 months   CBC is WNL. No acute concerns today.     Vitamin D  deficiency Assessment & Plan Lab Results  Component Value Date   VD25OH 51.5 01/05/2024   VD25OH 40.3 02/25/2023   VD25OH 44.4 11/05/2022   Vit D levels are at goal between 50 and 70. Pt is taking ERGO 50K units weekly. Good compliance and tolerance. Continue supplementation.     Other hyperlipidemia Assessment & Plan Lab Results   Component Value Date   CHOL 196 01/05/2024   HDL 85 01/05/2024   LDLCALC 97 01/05/2024   LDLDIRECT 101 (H) 01/05/2024   TRIG 78 01/05/2024   CHOLHDL 2.3 01/05/2024    Managed with lifetyle and diet interventions. No medications. LDL levels are decreasing but still not at goal. HDL has increased to goal above 60. Continue following prudent meal plan and decreasing saturated and trans fat. Will obtain labs as medically necessary.     Mood disorder (HCC), with emotional eating Assessment & Plan Pt states that she feels like her emotional eating is there but is still trying to wrap her head around what  emotional eating is. Pt declines Dr.Barker referral and will work on obtaining a Veterinary surgeon in the near future. Continue practicing mindful eating.     Elevated BP without diagnosis of hypertension Assessment & Plan BP Readings from Last 3 Encounters:  01/25/24 (!) 140/90  01/05/24 129/83  11/09/23 138/80   The 10-year ASCVD risk score (Arnett DK, et al., 2019) is: 0.9%  Lab Results  Component Value Date   CREATININE 0.93 01/05/2024   BP is not at goal poorly controlled. Pt is not taking any medications for BP. Pt states that she has been stressed and not sleeping properly which could lead to elevated BP. Recommended pt to obtain an upper arm cuff to check BP at home. Continue following nutrient meal plan and a.void buying foods that are: processed, frozen, or prepackaged to avoid excess salt. Will continue monitoring.       Follow up:   Return 02/23/2024 at 12:20 PM  She was informed of the importance of frequent follow up visits to maximize her success with intensive lifestyle modifications for her multiple health conditions.   Weight Summary and Biometrics   Weight Lost Since Last Visit: 0lb  Weight Gained Since Last Visit: 1lb    Vitals Temp: 98.1 F (36.7 C) BP: (!) 140/90 Pulse Rate: 76 SpO2: 98 %   Anthropometric Measurements Height: 5' 5 (1.651 m) Weight:  237 lb (107.5 kg) BMI (Calculated): 39.44 Weight at Last Visit: 236lb Weight Lost Since Last Visit: 0lb Weight Gained Since Last Visit: 1lb Starting Weight: 246lb Total Weight Loss (lbs): 9 lb (4.082 kg)   Body Composition  Body Fat %: 44.7 % Fat Mass (lbs): 106.2 lbs Muscle Mass (lbs): 125 lbs Total Body Water (lbs): 90.8 lbs Visceral Fat Rating : 13   Other Clinical Data Fasting: no Labs: no Today's Visit #: 29 Starting Date: 04/24/21    Objective:   PHYSICAL EXAM: Blood pressure (!) 140/90, pulse 76, temperature 98.1 F (36.7 C), height 5' 5 (1.651 m), weight 237 lb (107.5 kg), last menstrual period 12/29/2023, SpO2 98%. Body mass index is 39.44 kg/m.  General: she is overweight, cooperative and in no acute distress. PSYCH: Has normal mood, affect and thought process.   HEENT: EOMI, sclerae are anicteric. Lungs: Normal breathing effort, no conversational dyspnea. Extremities: Moves * 4 Neurologic: A and O * 3, good insight  DIAGNOSTIC DATA REVIEWED: BMET    Component Value Date/Time   NA 139 01/05/2024 1208   K 4.6 01/05/2024 1208   CL 101 01/05/2024 1208   CO2 24 01/05/2024 1208   GLUCOSE 88 01/05/2024 1208   GLUCOSE 82 01/26/2011 1450   BUN 20 01/05/2024 1208   CREATININE 0.93 01/05/2024 1208   CREATININE 0.61 01/26/2011 1450   CALCIUM 10.6 (H) 01/05/2024 1208   GFRNONAA 106 07/03/2019 1156   GFRAA 122 07/03/2019 1156   Lab Results  Component Value Date   HGBA1C 5.6 01/05/2024   HGBA1C 5.7 (H) 07/03/2019   Lab Results  Component Value Date   INSULIN  9.7 01/05/2024   INSULIN  16.7 07/03/2019   Lab Results  Component Value Date   TSH 1.120 04/24/2021   CBC    Component Value Date/Time   WBC 5.2 01/05/2024 1208   WBC 5.8 01/26/2011 1450   RBC 4.38 01/05/2024 1208   RBC 4.17 01/26/2011 1450   HGB 13.0 01/05/2024 1208   HCT 40.4 01/05/2024 1208   PLT 273 01/05/2024 1208   MCV 92 01/05/2024 1208   MCH 29.7 01/05/2024  1208   MCH 30.5  01/26/2011 1450   MCHC 32.2 01/05/2024 1208   MCHC 33.1 01/26/2011 1450   RDW 13.8 01/05/2024 1208   Iron Studies No results found for: IRON, TIBC, FERRITIN, IRONPCTSAT Lipid Panel     Component Value Date/Time   CHOL 196 01/05/2024 1208   TRIG 78 01/05/2024 1208   HDL 85 01/05/2024 1208   CHOLHDL 2.3 01/05/2024 1208   LDLCALC 97 01/05/2024 1208   LDLDIRECT 101 (H) 01/05/2024 1208   Hepatic Function Panel     Component Value Date/Time   PROT 7.4 01/05/2024 1208   ALBUMIN 4.9 01/05/2024 1208   AST 27 01/05/2024 1208   ALT 21 01/05/2024 1208   ALKPHOS 100 01/05/2024 1208   BILITOT 0.7 01/05/2024 1208      Component Value Date/Time   TSH 1.120 04/24/2021 0906   Nutritional Lab Results  Component Value Date   VD25OH 51.5 01/05/2024   VD25OH 40.3 02/25/2023   VD25OH 44.4 11/05/2022    Attestations:   I, Sonny Laroche, acting as a Stage manager for Amber Jenkins, DO., have compiled all relevant documentation for today's office visit on behalf of Amber Jenkins, DO, while in the presence of [employer], DO.  I have spent 40 minutes in the care of the patient today including 28 minutes face-to-face assessing and reviewing listed medical problems above as outlined in office visit note and providing nutritional and behavioral counseling as outlined in obesity care plan.   I have reviewed the above documentation for accuracy and completeness, and I agree with the above. Amber JINNY Parrish, D.O.  The 21st Century Cures Act was signed into law in 2016 which includes the topic of electronic health records.  This provides immediate access to information in MyChart.  This includes consultation notes, operative notes, office notes, lab results and pathology reports.  If you have any questions about what you read please let us  know at your next visit so we can discuss your concerns and take corrective action if need be.  We are right here with you.

## 2024-02-01 NOTE — Progress Notes (Unsigned)
 NEUROLOGY FOLLOW UP OFFICE NOTE  Amber Parrish [id number]  Assessment/Plan:   Chronic headaches, dizziness - likely multifactorial.  Headaches do have migraine features.  This may be idiopathic intracranial hypertension as she did have opening pressure of 26 with improvement of headaches for a couple of weeks but did not respond to acetazolamide  or topiramate .  She also likely has a cervicogenic component as well.   I will refer to the headache clinic at [facility] for their opinion. In the meantime, I will attempt to treat for IIH by starting furosemide  20mg  daily.  Check BMP today and again in 2 weeks. Continue Qulipta  60mg  daily and rizatriptan  10mg  as needed. As there does seem to be a cervicogenic component and she has failed physical therapy and medication management, will check MRI of cervical spine without contrast.   Will treat for presumed migraines.  Discontinue nortriptyline .  Plan to start Qulipta  60mg  daily Continue rizatriptan  10mg  Follow up with ophthalmology Follow up in 4 months.   Total time spent in chart and face to face with patient:  42 minutes   Subjective:  Amber Parrish is a 53 year old right-handed female who follows up for headaches.     UPDATE: Started Qulipta . For the first few months, headaches improved.  They were still persistent but much less severe.  They became more severe over the last 3 weeks.  She does not overuse any analgesics.  Current NSAIDS/analgesics:  ibuprofen (rare) Current triptans:  rizatriptan  10mg  Current ergotamine:  none Current anti-emetic:  none Current muscle relaxants:  tizanidine  2mg   at bedtime (cannot tolerate taking three times daily) Current Antihypertensive medications:  none Current Antidepressant medications:  none Current Anticonvulsant medications: none Current anti-CGRP:  Qulipta  60mg  daily Current Vitamins/Herbal/Supplements:  none Current Antihistamines/Decongestants:  none Other therapy:   none Hormone/birth control:  none   Caffeine:  occasional coffee Diet:  Hydrates.  Watches her diet.  No soda.  [facility]. Exercise:  not routine Depression:  no; Anxiety: stable  Other pain:  no Sleep hygiene:  wakes up more often in the middle of the night. Maybe 6 hours of sleep.  No daytime sleepiness.     HISTORY:  She began having headaches around the summer 2022.   It is a unilateral pressure (sometimes stabbing) occipital headache (either side) that begins as 2-3/10 in the morning and gradually spreads to the forehead at a 6-7/10 intensity late in the day.  Headache worse when upright but also gets severe head pressure if she bends over.  Sometimes neck pain and radiates into both temples by end of day.  She has photosensitivity and occasional dizziness but denies nausea, vomiting, phonophobia, autonomic symptoms, numbness or weakness.  They occur daily.  Bright light and intense smells are triggers.  Rest and hydration help relieve it.  She started noticing blurred vision whenever concentrating/reading or using computer but denies visual obscurations.  When headaches are more severe, she may hear a whooshing in her head.  She tried treating with ibuprofen but since ineffective, she has not taken any analgesics.  Also with some neck pain.  X-ray of cervical spine was unremarkable   MRI and MRV of brain with and without contrast in November 2022 were unremarkable.  She had a formal eye exam in March 2024 which revealed no papilledema.  CT head from 08/21/2022 was normal.  Underwent lumbar puncture on 09/01/2022 which revealed opening pressure of 26 cm water  and closing pressure of 17 cm water.  CSF labs (cell count, glucose, protein, culture and cytology) were normal.  Following the LP, she noted improvement for a couple of weeks but symptoms returned to prior baseline.  She did not respond to topiramate  or acetazolamide .  She underwent physical therapy for the  neck which was ineffective.  OMM was somewhat helpful.  She has tried several medications for migraine prevention as well, including topiramate , nortriptyline  and venlafaxine , which were ineffective.  Qulipta  helped for several months before seeming to lose efficacy.  02/24/2021 MRV HEAD W WO:  Normal intracranial MRV. No evidence for dural sinus thrombosis or other abnormality. 03/04/2021 MRI BRAIN W WO:  Unremarkable appearance of the brain. 04/24/2021 LABS:  TSH 1.120, free T4 1.32, B12 711 06/27/2021 X-RAY C-SPINE:  Negative cervical spine radiographs.  04/12/2023 CTA HEAD & NECK:  No significant vascular abnormality.      Past NSAIDS/analgesics:  ibuprofen Past abortive triptans:  none Past abortive ergotamine:  none Past muscle relaxants:  none Past anti-emetic:  none Past antihypertensive medications: none Past antidepressant medications:  Nortriptyline , venlafaxine  XR 75mg  daily, Sertraline , Wellbutrin  Past anticonvulsant medications:  topiramate  ER, acetazolamide  1000mg  BID Past anti-CGRP:  none Past vitamins/Herbal/Supplements:  none Past antihistamines/decongestants:  Benadryl Other past therapies:  OMM (Dr. Morene Mace - effective)     Family history of headache:  not that she knows.   PAST MEDICAL HISTORY: Past Medical History:  Diagnosis Date   Bilateral hand swelling    Headache    Heartburn    Joint pain    Muscle pain    Other fatigue    Overweight    Shortness of breath on exertion     MEDICATIONS: Current Outpatient Medications on File Prior to Visit  Medication Sig Dispense Refill   acetaZOLAMIDE  ER (DIAMOX ) 500 MG capsule Take 2 capsules (1,000 mg total) by mouth 2 (two) times daily. 120 capsule 5   Atogepant  (QULIPTA ) 60 MG TABS Take 1 tablet (60 mg total) by mouth daily. 90 tablet 2   levocetirizine (XYZAL ) 5 MG tablet Take 1 tablet (5 mg total) by mouth every evening. 90 tablet 0   metFORMIN  (GLUCOPHAGE ) 500 MG tablet 2 po with lunch and 1  with dinner daily 270 tablet 0   montelukast  (SINGULAIR ) 10 MG tablet Take 1 tablet (10 mg total) by mouth at bedtime. 90 tablet 0   predniSONE  (DELTASONE ) 10 MG tablet 6 tablets all together day 1, 5 tablets day 2, 4 tablets day 3, 3 tablets day 4, 2 tablets day 5, 1 tablet day 6. (Patient not taking: Reported on 01/25/2024) 21 tablet 0   rizatriptan  (MAXALT ) 10 MG tablet TAKE 1 TABLET BY MOUTH AS NEEDED. REPEAT IN 2 HOURS IF NEEDED. MAX 2TABS/24HRS (Patient not taking: Reported on 01/25/2024) 18 tablet 5   tiZANidine  (ZANAFLEX ) 2 MG tablet Take 1 tablet (2 mg total) by mouth 3 (three) times daily as needed. 90 tablet 5   Vitamin D , Ergocalciferol , (DRISDOL ) 1.25 MG (50000 UNIT) CAPS capsule 1 po q Mon and 1 po q Thurs 24 capsule 0   No current facility-administered medications on file prior to visit.    ALLERGIES: Allergies  Allergen Reactions   Penicillins Other (See Comments)    FAMILY HISTORY: Family History  Problem Relation Age of Onset   Diabetes Mother    Hypertension Mother    Obesity Mother    Diabetes Father    Alcohol abuse Father    Arthritis Maternal Grandmother  Cancer Maternal Grandmother    Diabetes Maternal Grandmother    Hypertension Maternal Grandmother       Objective:  Blood pressure (!) 140/76, pulse 75, height 5' 4 (1.626 m), weight 245 lb (111.1 kg), last menstrual period 12/29/2023, SpO2 98%. General: No acute distress.  Patient appears well-groomed.   Head:  Normocephalic/atraumatic Neck:  Supple.  No paraspinal tenderness.  Full range of motion. Heart:  Regular rate and rhythm. Neuro:  Alert and oriented.  Speech fluent and not dysarthric.  Language intact.  CN II-XII intact.  Bulk and tone normal.  Muscle strength 5/5 throughout.  Sensation to light touch intact.  Deep tendon reflexes 2+ throughout, toes downgoing.  Gait normal.  Romberg negative.      Juliene Dunnings, DO  CC: Norleen Jobs, MD

## 2024-02-03 ENCOUNTER — Encounter: Payer: Self-pay | Admitting: Neurology

## 2024-02-03 ENCOUNTER — Ambulatory Visit (INDEPENDENT_AMBULATORY_CARE_PROVIDER_SITE_OTHER): Admitting: Neurology

## 2024-02-03 ENCOUNTER — Other Ambulatory Visit

## 2024-02-03 VITALS — BP 140/76 | HR 75 | Ht 64.0 in | Wt 245.0 lb

## 2024-02-03 DIAGNOSIS — M542 Cervicalgia: Secondary | ICD-10-CM | POA: Diagnosis not present

## 2024-02-03 DIAGNOSIS — G43709 Chronic migraine without aura, not intractable, without status migrainosus: Secondary | ICD-10-CM | POA: Diagnosis not present

## 2024-02-03 DIAGNOSIS — Z79899 Other long term (current) drug therapy: Secondary | ICD-10-CM

## 2024-02-03 MED ORDER — TIZANIDINE HCL 2 MG PO TABS: 2.0000 mg | ORAL_TABLET | Freq: Three times a day (TID) | ORAL | 5 refills | Status: AC | PRN

## 2024-02-03 MED ORDER — QULIPTA 60 MG PO TABS: 1.0000 | ORAL_TABLET | Freq: Every day | ORAL | 2 refills | Status: AC

## 2024-02-03 MED ORDER — RIZATRIPTAN BENZOATE 10 MG PO TABS: ORAL_TABLET | ORAL | 5 refills | Status: AC

## 2024-02-03 MED ORDER — FUROSEMIDE 20 MG PO TABS: 20.0000 mg | ORAL_TABLET | Freq: Every day | ORAL | 5 refills | Status: AC

## 2024-02-03 NOTE — Patient Instructions (Addendum)
 Check BMP today.  Start furosemide  20mg  daily. Repeat BMP in 2 weeks.  Check MRI of cervical spine without contrast  Continue Qulipta , rizatriptan , tizanidine   Refer to headache clinic at [facility]  Follow up 6 months.

## 2024-02-04 LAB — BASIC METABOLIC PANEL WITH GFR
BUN: 17 mg/dL (ref 7–25)
CO2: 27 mmol/L (ref 20–32)
Calcium: 10.7 mg/dL — ABNORMAL HIGH (ref 8.6–10.4)
Chloride: 102 mmol/L (ref 98–110)
Creat: 0.72 mg/dL (ref 0.50–1.03)
Glucose, Bld: 85 mg/dL (ref 65–99)
Potassium: 4.8 mmol/L (ref 3.5–5.3)
Sodium: 140 mmol/L (ref 135–146)
eGFR: 101 mL/min/1.73m2 (ref >=60)

## 2024-02-23 ENCOUNTER — Ambulatory Visit (INDEPENDENT_AMBULATORY_CARE_PROVIDER_SITE_OTHER): Payer: Self-pay | Admitting: Family Medicine

## 2024-02-24 ENCOUNTER — Ambulatory Visit
Admission: RE | Admit: 2024-02-24 | Discharge: 2024-02-24 | Disposition: A | Source: Ambulatory Visit | Attending: Neurology

## 2024-02-29 ENCOUNTER — Ambulatory Visit: Payer: Self-pay | Admitting: Neurology

## 2024-02-29 NOTE — Progress Notes (Signed)
 Patient advised.

## 2024-03-07 ENCOUNTER — Ambulatory Visit (INDEPENDENT_AMBULATORY_CARE_PROVIDER_SITE_OTHER): Admitting: Family Medicine

## 2024-03-07 ENCOUNTER — Encounter (INDEPENDENT_AMBULATORY_CARE_PROVIDER_SITE_OTHER): Payer: Self-pay

## 2024-03-07 DIAGNOSIS — Z91199 Patient's noncompliance with other medical treatment and regimen due to unspecified reason: Secondary | ICD-10-CM

## 2024-03-07 NOTE — Progress Notes (Signed)
 No show

## 2024-04-12 ENCOUNTER — Other Ambulatory Visit (INDEPENDENT_AMBULATORY_CARE_PROVIDER_SITE_OTHER): Payer: Self-pay | Admitting: Family Medicine

## 2024-04-12 DIAGNOSIS — J3089 Other allergic rhinitis: Secondary | ICD-10-CM

## 2024-04-12 DIAGNOSIS — E559 Vitamin D deficiency, unspecified: Secondary | ICD-10-CM

## 2024-05-23 ENCOUNTER — Ambulatory Visit: Payer: Self-pay | Admitting: *Deleted

## 2024-05-25 ENCOUNTER — Encounter: Payer: Self-pay | Admitting: Family Medicine

## 2024-05-25 ENCOUNTER — Ambulatory Visit: Admitting: Family Medicine

## 2024-05-25 VITALS — BP 154/98 | HR 82 | Ht 64.0 in | Wt 247.0 lb

## 2024-05-25 DIAGNOSIS — G5621 Lesion of ulnar nerve, right upper limb: Secondary | ICD-10-CM | POA: Diagnosis not present

## 2024-05-25 NOTE — Progress Notes (Signed)
" ° °  Subjective:    Patient ID: Amber Parrish, female    DOB: [birth year], 54 y.o.   MRN: [id number]  Discussed the use of AI scribe software for clinical note transcription with the patient, who gave verbal consent to proceed.  History of Present Illness   Amber Parrish is a 54 year old female who presents with right elbow pain and tingling sensation.  For the past two months, she has experienced a random shooting pain in her right elbow, described as feeling like a 'metal rod is shoving through my elbow.' The pain radiates down her arm, accompanied by a tingling sensation and pressure more in the medial distribution. No recent injury or fall. The tingling sensation is not numbness but feels like she can 'feel every ounce of every muscle or tendon' when she stretches her fingers. The sensation is more pronounced on the top of her hand and worsens with certain movements, such as stretching her fingers wide or bending her wrist.  Her entire right arm and hand are constantly cold, which is not the case for the rest of her body. She often wraps a blanket around her arm to keep it warm.  Her work environment changed in November, leading to a smaller workspace and different desk setup, which may have affected her elbow position during prolonged laptop use. She spends most of her day at a laptop, which may contribute to her symptoms.  She finds relief by resting her arm on a pillow at home.           Review of Systems     Objective:    Physical Exam Physical Exam   MUSCULOSKELETAL: Right hand exhibits weakness compared to the left, with lumbricals and interossei.  Normal sensation.  Slight decrease in strength.          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 - Instructed to maintain a  neutral wrist position to prevent median nerve involvement.  And also do not bend her elbow more than 90 degrees. - Recommended taking Advil or Aleve regularly for several weeks. - Consider further evaluation with nerve conduction studies if symptoms persist.        "

## 2024-08-29 ENCOUNTER — Ambulatory Visit: Admitting: Neurology
